# Patient Record
Sex: Female | Born: 1940
Health system: Southern US, Community
[De-identification: ages and names within clinical notes are randomized; demographics above are authoritative.]

## PROBLEM LIST (undated history)

## (undated) DIAGNOSIS — I10 Essential (primary) hypertension: Secondary | ICD-10-CM

## (undated) DIAGNOSIS — L309 Dermatitis, unspecified: Secondary | ICD-10-CM

## (undated) DIAGNOSIS — I739 Peripheral vascular disease, unspecified: Secondary | ICD-10-CM

## (undated) DIAGNOSIS — N6019 Diffuse cystic mastopathy of unspecified breast: Secondary | ICD-10-CM

## (undated) DIAGNOSIS — M1990 Unspecified osteoarthritis, unspecified site: Secondary | ICD-10-CM

## (undated) DIAGNOSIS — E119 Type 2 diabetes mellitus without complications: Secondary | ICD-10-CM

## (undated) DIAGNOSIS — H9190 Unspecified hearing loss, unspecified ear: Secondary | ICD-10-CM

## (undated) DIAGNOSIS — N189 Chronic kidney disease, unspecified: Secondary | ICD-10-CM

## (undated) DIAGNOSIS — F419 Anxiety disorder, unspecified: Secondary | ICD-10-CM

## (undated) DIAGNOSIS — R413 Other amnesia: Secondary | ICD-10-CM

## (undated) DIAGNOSIS — K219 Gastro-esophageal reflux disease without esophagitis: Secondary | ICD-10-CM

## (undated) DIAGNOSIS — M2042 Other hammer toe(s) (acquired), left foot: Secondary | ICD-10-CM

## (undated) DIAGNOSIS — K589 Irritable bowel syndrome without diarrhea: Secondary | ICD-10-CM

## (undated) DIAGNOSIS — Z9109 Other allergy status, other than to drugs and biological substances: Secondary | ICD-10-CM

## (undated) DIAGNOSIS — R32 Unspecified urinary incontinence: Secondary | ICD-10-CM

## (undated) DIAGNOSIS — M2041 Other hammer toe(s) (acquired), right foot: Secondary | ICD-10-CM

## (undated) DIAGNOSIS — E7800 Pure hypercholesterolemia, unspecified: Secondary | ICD-10-CM

## (undated) DIAGNOSIS — M069 Rheumatoid arthritis, unspecified: Secondary | ICD-10-CM

## (undated) DIAGNOSIS — D649 Anemia, unspecified: Secondary | ICD-10-CM

## (undated) HISTORY — DX: Peripheral vascular disease, unspecified: I73.9

## (undated) HISTORY — DX: Other amnesia: R41.3

## (undated) HISTORY — PX: ABDOMINAL HYSTERECTOMY: SHX81

## (undated) HISTORY — DX: Unspecified urinary incontinence: R32

## (undated) HISTORY — DX: Gastro-esophageal reflux disease without esophagitis: K21.9

## (undated) HISTORY — DX: Essential (primary) hypertension: I10

## (undated) HISTORY — DX: Diffuse cystic mastopathy of unspecified breast: N60.19

## (undated) HISTORY — DX: Other allergy status, other than to drugs and biological substances: Z91.09

## (undated) HISTORY — DX: Unspecified osteoarthritis, unspecified site: M19.90

## (undated) HISTORY — PX: SHOULDER SURGERY: SHX246

## (undated) HISTORY — DX: Dermatitis, unspecified: L30.9

## (undated) HISTORY — DX: Anxiety disorder, unspecified: F41.9

## (undated) HISTORY — DX: Unspecified hearing loss, unspecified ear: H91.90

## (undated) HISTORY — DX: Other hammer toe(s) (acquired), right foot: M20.41

## (undated) HISTORY — DX: Pure hypercholesterolemia, unspecified: E78.00

## (undated) HISTORY — DX: Rheumatoid arthritis, unspecified: M06.9

## (undated) HISTORY — DX: Type 2 diabetes mellitus without complications: E11.9

## (undated) HISTORY — DX: Other hammer toe(s) (acquired), left foot: M20.42

## (undated) HISTORY — DX: Irritable bowel syndrome, unspecified: K58.9

---

## 1998-04-11 ENCOUNTER — Encounter: Admission: RE | Admit: 1998-04-11 | Discharge: 1998-07-10 | Payer: Self-pay | Admitting: *Deleted

## 1998-09-20 ENCOUNTER — Encounter: Admission: RE | Admit: 1998-09-20 | Discharge: 1998-12-19 | Payer: Self-pay | Admitting: *Deleted

## 2001-07-02 ENCOUNTER — Encounter: Admission: RE | Admit: 2001-07-02 | Discharge: 2001-09-30 | Payer: Self-pay | Admitting: *Deleted

## 2002-02-26 ENCOUNTER — Encounter: Payer: Self-pay | Admitting: Family Medicine

## 2002-02-26 ENCOUNTER — Ambulatory Visit (HOSPITAL_COMMUNITY): Admission: RE | Admit: 2002-02-26 | Discharge: 2002-02-26 | Payer: Self-pay | Admitting: Family Medicine

## 2002-08-18 ENCOUNTER — Encounter (INDEPENDENT_AMBULATORY_CARE_PROVIDER_SITE_OTHER): Payer: Self-pay | Admitting: Specialist

## 2002-08-18 ENCOUNTER — Encounter: Payer: Self-pay | Admitting: Gastroenterology

## 2002-08-18 ENCOUNTER — Ambulatory Visit (HOSPITAL_COMMUNITY): Admission: RE | Admit: 2002-08-18 | Discharge: 2002-08-18 | Payer: Self-pay | Admitting: Gastroenterology

## 2002-08-18 DIAGNOSIS — K5730 Diverticulosis of large intestine without perforation or abscess without bleeding: Secondary | ICD-10-CM | POA: Insufficient documentation

## 2004-06-27 ENCOUNTER — Encounter: Admission: RE | Admit: 2004-06-27 | Discharge: 2004-06-27 | Payer: Self-pay | Admitting: Rheumatology

## 2004-11-08 ENCOUNTER — Ambulatory Visit (HOSPITAL_COMMUNITY): Admission: RE | Admit: 2004-11-08 | Discharge: 2004-11-08 | Payer: Self-pay | Admitting: Family Medicine

## 2004-11-15 ENCOUNTER — Ambulatory Visit (HOSPITAL_COMMUNITY): Admission: RE | Admit: 2004-11-15 | Discharge: 2004-11-15 | Payer: Self-pay | Admitting: Family Medicine

## 2005-01-19 ENCOUNTER — Encounter: Admission: RE | Admit: 2005-01-19 | Discharge: 2005-04-19 | Payer: Self-pay | Admitting: Family Medicine

## 2005-03-29 ENCOUNTER — Ambulatory Visit (HOSPITAL_COMMUNITY): Admission: RE | Admit: 2005-03-29 | Discharge: 2005-03-29 | Payer: Self-pay | Admitting: Family Medicine

## 2005-10-15 ENCOUNTER — Other Ambulatory Visit: Admission: RE | Admit: 2005-10-15 | Discharge: 2005-10-15 | Payer: Self-pay | Admitting: Family Medicine

## 2005-12-26 ENCOUNTER — Emergency Department (HOSPITAL_COMMUNITY): Admission: EM | Admit: 2005-12-26 | Discharge: 2005-12-27 | Payer: Self-pay | Admitting: Emergency Medicine

## 2008-08-17 ENCOUNTER — Encounter: Payer: Self-pay | Admitting: Gastroenterology

## 2008-08-17 DIAGNOSIS — E119 Type 2 diabetes mellitus without complications: Secondary | ICD-10-CM | POA: Insufficient documentation

## 2008-08-17 DIAGNOSIS — I10 Essential (primary) hypertension: Secondary | ICD-10-CM | POA: Insufficient documentation

## 2008-08-17 DIAGNOSIS — E1129 Type 2 diabetes mellitus with other diabetic kidney complication: Secondary | ICD-10-CM | POA: Insufficient documentation

## 2008-08-17 DIAGNOSIS — D126 Benign neoplasm of colon, unspecified: Secondary | ICD-10-CM | POA: Insufficient documentation

## 2008-08-18 ENCOUNTER — Ambulatory Visit: Payer: Self-pay | Admitting: Gastroenterology

## 2008-08-18 DIAGNOSIS — R1310 Dysphagia, unspecified: Secondary | ICD-10-CM | POA: Insufficient documentation

## 2008-08-30 ENCOUNTER — Telehealth (INDEPENDENT_AMBULATORY_CARE_PROVIDER_SITE_OTHER): Payer: Self-pay | Admitting: *Deleted

## 2008-09-14 ENCOUNTER — Telehealth: Payer: Self-pay | Admitting: Gastroenterology

## 2008-09-14 ENCOUNTER — Ambulatory Visit: Payer: Self-pay | Admitting: Gastroenterology

## 2010-04-04 ENCOUNTER — Other Ambulatory Visit: Admission: RE | Admit: 2010-04-04 | Discharge: 2010-04-04 | Payer: Self-pay | Admitting: Family Medicine

## 2010-05-12 ENCOUNTER — Telehealth: Payer: Self-pay | Admitting: Gastroenterology

## 2010-08-08 ENCOUNTER — Encounter: Admission: RE | Admit: 2010-08-08 | Discharge: 2010-08-08 | Payer: Self-pay | Admitting: Family Medicine

## 2010-12-16 ENCOUNTER — Encounter: Payer: Self-pay | Admitting: Family Medicine

## 2010-12-28 NOTE — Progress Notes (Signed)
Summary: doctor switch request  Phone Note From Other Clinic Call back at [phone]   Caller: [facility] Call For: Dr. Deatra Ina Reason for Call: Schedule Patient Appt Summary of Call: would like to switch from Dr. Deatra Ina to Dr. Olevia Perches... reason: would like someone who "listens and cares"... referred for "guaiac positive stools"... call [name] directly to sch first appt or inform of nonapproval  Initial call taken by: Lucien Mons,  May 12, 2010 11:47 AM  Follow-up for Phone Call        [name] called back and asked that previous mesg be cancelled... [facility] is going to send pt "somewhere else"... asked then if pt will be discontinuing her care here with Dr. Deatra Ina and [name] stated that she "will let the pt tell us that" Follow-up by: Lucien Mons,  May 12, 2010 11:56 AM

## 2011-08-27 LAB — GLUCOSE, CAPILLARY
Glucose-Capillary: 122 — ABNORMAL HIGH
Glucose-Capillary: 91

## 2012-10-10 ENCOUNTER — Other Ambulatory Visit: Payer: Self-pay | Admitting: Family Medicine

## 2012-10-10 ENCOUNTER — Other Ambulatory Visit: Payer: Self-pay | Admitting: *Deleted

## 2012-10-10 DIAGNOSIS — R0989 Other specified symptoms and signs involving the circulatory and respiratory systems: Secondary | ICD-10-CM

## 2012-10-15 ENCOUNTER — Ambulatory Visit
Admission: RE | Admit: 2012-10-15 | Discharge: 2012-10-15 | Disposition: A | Discharge status: Home / Self Care | Payer: Medicare Other | Source: Ambulatory Visit | Attending: Family Medicine | Admitting: Family Medicine

## 2012-10-15 DIAGNOSIS — R0989 Other specified symptoms and signs involving the circulatory and respiratory systems: Secondary | ICD-10-CM

## 2012-10-20 ENCOUNTER — Other Ambulatory Visit (HOSPITAL_COMMUNITY): Payer: Self-pay | Admitting: Family Medicine

## 2012-10-20 DIAGNOSIS — I999 Unspecified disorder of circulatory system: Secondary | ICD-10-CM

## 2012-10-22 ENCOUNTER — Ambulatory Visit (HOSPITAL_COMMUNITY)
Admission: RE | Admit: 2012-10-22 | Discharge: 2012-10-22 | Disposition: A | Discharge status: Home / Self Care | Payer: Medicare Other | Source: Ambulatory Visit | Attending: Family Medicine | Admitting: Family Medicine

## 2012-10-22 ENCOUNTER — Ambulatory Visit (HOSPITAL_COMMUNITY): Admission: RE | Admit: 2012-10-22 | Payer: Medicare Other | Source: Ambulatory Visit

## 2012-10-22 ENCOUNTER — Other Ambulatory Visit (HOSPITAL_COMMUNITY): Payer: Self-pay | Admitting: Family Medicine

## 2012-10-22 ENCOUNTER — Ambulatory Visit (HOSPITAL_COMMUNITY): Payer: Medicare Other

## 2012-10-22 DIAGNOSIS — I70209 Unspecified atherosclerosis of native arteries of extremities, unspecified extremity: Secondary | ICD-10-CM | POA: Insufficient documentation

## 2012-10-22 DIAGNOSIS — I999 Unspecified disorder of circulatory system: Secondary | ICD-10-CM

## 2012-10-22 DIAGNOSIS — M79609 Pain in unspecified limb: Secondary | ICD-10-CM | POA: Insufficient documentation

## 2012-10-22 LAB — BUN: BUN: 16 mg/dL (ref 6–23)

## 2012-10-22 LAB — CREATININE, SERUM
Creatinine, Ser: 0.75 mg/dL (ref 0.50–1.10)
GFR calc Af Amer: >90 mL/min (ref >90)
GFR calc non Af Amer: 83 mL/min — ABNORMAL LOW (ref >90)

## 2012-10-22 LAB — GLUCOSE, CAPILLARY: Glucose-Capillary: 108 mg/dL — ABNORMAL HIGH (ref 70–99)

## 2012-10-22 MED ORDER — GADOBENATE DIMEGLUMINE 529 MG/ML IV SOLN
15.0000 mL | Freq: Once | INTRAVENOUS | Status: AC
  Administered 2012-10-22: 15 mL via INTRAVENOUS

## 2012-12-01 ENCOUNTER — Encounter: Payer: Self-pay | Admitting: Vascular Surgery

## 2012-12-02 ENCOUNTER — Encounter: Payer: Self-pay | Admitting: Vascular Surgery

## 2012-12-02 ENCOUNTER — Ambulatory Visit (INDEPENDENT_AMBULATORY_CARE_PROVIDER_SITE_OTHER): Payer: Medicare Other | Admitting: Vascular Surgery

## 2012-12-02 VITALS — BP 153/80 | HR 83 | Resp 18 | Ht 69.5 in | Wt 138.0 lb

## 2012-12-02 DIAGNOSIS — I739 Peripheral vascular disease, unspecified: Secondary | ICD-10-CM

## 2012-12-02 NOTE — Progress Notes (Signed)
[facility]   Patient name: Wendy [name] MRN: [id number] DOB: [birth year] Sex: female   Referred by: Dr Dema Severin  Reason for referral:  Chief Complaint  Patient presents with  . New Evaluation    B/L LEG PAIN    HISTORY OF PRESENT ILLNESS: Patient is a very pleasant 72 year old female who presents today for evaluation of lower extremity symptoms and for discussion of recent vascular studies. She reports a history of swelling in her left foot and ankle more so than her right. She does not have any history of tissue loss in either lower Shoney's. She does report that with a great deal of walking in both legs "give out. She reports this is whole leg and not specifically in her foot Her ankle. Does have a history of arthritis and diabetes. She is not a cigarette smoker.  Past Medical History  Diagnosis Date  . Arthritis   . Diabetes mellitus without complication   . Hypertension     Past Surgical History  Procedure Date  . Abdominal hysterectomy   . Shoulder surgery     FATTY TUMOR REMOVED    History   Social History  . Marital Status: Single    Spouse Name: N/A    Number of Children: N/A  . Years of Education: N/A   Occupational History  . Not on file.   Social History Main Topics  . Smoking status: Never Smoker   . Smokeless tobacco: Never Used  . Alcohol Use: No  . Drug Use: No  . Sexually Active: Not on file   Other Topics Concern  . Not on file   Social History Narrative  . No narrative on file    Family History  Problem Relation Age of Onset  . Diabetes Mother   . Hypertension Mother     Allergies as of 12/02/2012 - Review Complete 12/02/2012  Allergen Reaction Noted  . Amlodipine besylate    . Codeine    . Erythromycin    . Lisinopril    . Naproxen    . Penicillins    . Pioglitazone      Current Outpatient Prescriptions on File Prior to Visit  Medication Sig Dispense Refill  . estradiol (ESTRACE) 0.5 MG tablet  Take 0.5 mg by mouth daily.      . irbesartan (AVAPRO) 300 MG tablet Take 300 mg by mouth at bedtime.      . metFORMIN (GLUCOPHAGE) 1000 MG tablet Take 1,000 mg by mouth 2 (two) times daily with a meal.         REVIEW OF SYSTEMS:  Positives indicated with an "X"  CARDIOVASCULAR:  [ ]  chest pain   [ ]  chest pressure   [ ]  palpitations   [ ]  orthopnea   [ ]  dyspnea on exertion   [ ]  claudication   [ ]  rest pain   [ ]  DVT   [ ]  phlebitis PULMONARY:   [ ]  productive cough   [ ]  asthma   [ ]  wheezing NEUROLOGIC:   [x ] weakness  [ ]  paresthesias  [ ]  aphasia  [ ]  amaurosis  [ ]  dizziness HEMATOLOGIC:   [ ]  bleeding problems   [ ]  clotting disorders MUSCULOSKELETAL:  [ ]  joint pain   [ ]  joint swelling GASTROINTESTINAL: [ ]   blood in stool  [ ]   hematemesis GENITOURINARY:  [ ]   dysuria  [ ]   hematuria PSYCHIATRIC:  [ ]  history of major depression  INTEGUMENTARY:  [ ]  rashes  [ ]  ulcers CONSTITUTIONAL:  [ ]  fever   [ ]  chills  PHYSICAL EXAMINATION:  General: The patient is a well-nourished female, in no acute distress. Vital signs are BP 153/80  Pulse 83  Resp 18  Ht 5' 9.5" (1.765 m)  Wt 138 lb (62.596 kg)  BMI 20.09 kg/m2 Pulmonary: There is a good air exchange bilaterally without wheezing or rales. Abdomen: Soft and non-tender with normal pitch bowel sounds. Musculoskeletal: There are no major deformities.  There is no significant extremity pain. Neurologic: No focal weakness or paresthesias are detected, Skin: There are no ulcer or rashes noted. Psychiatric: The patient has normal affect. Cardiovascular: There is a regular rate and rhythm without significant murmur appreciated. Pulse status: 2+ radial 2+ femoral and 2+ popliteal pulses. She has a faint dorsalis pedis pulse on the left and no palpable pedal pulses on the right. Carotid arteries without bruits bilaterally   Vascular Lab Studies:   imaging she underwent lower extremity noninvasive studies showing tibial  occlusive disease  MRA similarly showed no significant aortoiliac occlusive disease and mild SFA occlusive disease. She does have poor tibial runoff with anterior tibial being the dominant runoff vessel bilaterally  Impression and Plan:  Distal tibial occlusive disease. I discussed the significance of this at length with the patient. I explained that this generally does not require any treatment in general he has not caused symptoms. I do not feel that her leg tiredness is related to this since it is whole leg bilaterally. I explained only concern would be if she had difficulty with wound problems in her feet that we would need to be involved to assure healing. At her current level of flow, I suspect that she would have no difficulty healing in the lesions in the same became progressive. She was reassured this discussion will see [place] again on an as-needed basis    EARLY, TODD [facility] [place] Office: [phone]

## 2014-02-19 ENCOUNTER — Other Ambulatory Visit: Payer: Self-pay | Admitting: Family Medicine

## 2014-02-19 DIAGNOSIS — R634 Abnormal weight loss: Secondary | ICD-10-CM

## 2014-02-19 DIAGNOSIS — R109 Unspecified abdominal pain: Secondary | ICD-10-CM

## 2014-02-25 DIAGNOSIS — D509 Iron deficiency anemia, unspecified: Secondary | ICD-10-CM | POA: Insufficient documentation

## 2014-02-25 DIAGNOSIS — M069 Rheumatoid arthritis, unspecified: Secondary | ICD-10-CM | POA: Insufficient documentation

## 2014-02-27 DIAGNOSIS — E041 Nontoxic single thyroid nodule: Secondary | ICD-10-CM | POA: Insufficient documentation

## 2014-02-27 DIAGNOSIS — E278 Other specified disorders of adrenal gland: Secondary | ICD-10-CM | POA: Insufficient documentation

## 2014-03-02 ENCOUNTER — Ambulatory Visit: Payer: Medicare Other | Admitting: Neurology

## 2014-04-09 ENCOUNTER — Encounter: Payer: Self-pay | Admitting: Neurology

## 2014-04-09 ENCOUNTER — Ambulatory Visit (INDEPENDENT_AMBULATORY_CARE_PROVIDER_SITE_OTHER): Payer: Medicare Other | Admitting: Neurology

## 2014-04-09 DIAGNOSIS — R413 Other amnesia: Secondary | ICD-10-CM
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
## 2014-04-09 NOTE — Patient Instructions (Signed)
1. Continue control of blood pressure, diabetes, and cholesterol 2. Please let us know where to obtain prior records of your MRI and brain studies 3. Physical and brain stimulation exercises are important for brain health

## 2014-04-20 ENCOUNTER — Telehealth: Payer: Self-pay | Admitting: Neurology

## 2014-04-20 NOTE — Telephone Encounter (Signed)
Pt son would like to talk to someone about his mother. He would like to see if we got the outside records please call Ellard Artis at [phone]

## 2014-04-21 NOTE — Telephone Encounter (Signed)
Spoke with patient son

## 2014-04-23 ENCOUNTER — Emergency Department (HOSPITAL_COMMUNITY): Payer: Medicare Other

## 2014-04-23 ENCOUNTER — Emergency Department (HOSPITAL_COMMUNITY)
Admission: EM | Admit: 2014-04-23 | Discharge: 2014-04-23 | Disposition: A | Discharge status: Home / Self Care | Payer: Medicare Other | Attending: Emergency Medicine | Admitting: Emergency Medicine

## 2014-04-23 ENCOUNTER — Encounter (HOSPITAL_COMMUNITY): Payer: Self-pay | Admitting: Emergency Medicine

## 2014-04-23 DIAGNOSIS — Z7982 Long term (current) use of aspirin: Secondary | ICD-10-CM | POA: Insufficient documentation

## 2014-04-23 DIAGNOSIS — E119 Type 2 diabetes mellitus without complications: Secondary | ICD-10-CM | POA: Insufficient documentation

## 2014-04-23 DIAGNOSIS — M549 Dorsalgia, unspecified: Secondary | ICD-10-CM

## 2014-04-23 DIAGNOSIS — F0390 Unspecified dementia without behavioral disturbance: Secondary | ICD-10-CM | POA: Insufficient documentation

## 2014-04-23 DIAGNOSIS — I10 Essential (primary) hypertension: Secondary | ICD-10-CM | POA: Insufficient documentation

## 2014-04-23 DIAGNOSIS — Z880 Allergy status to penicillin: Secondary | ICD-10-CM | POA: Insufficient documentation

## 2014-04-23 DIAGNOSIS — M129 Arthropathy, unspecified: Secondary | ICD-10-CM | POA: Insufficient documentation

## 2014-04-23 DIAGNOSIS — Z79899 Other long term (current) drug therapy: Secondary | ICD-10-CM | POA: Insufficient documentation

## 2014-04-23 LAB — URINALYSIS, ROUTINE W REFLEX MICROSCOPIC
Bilirubin Urine: NEGATIVE
Glucose, UA: NEGATIVE mg/dL
Ketones, ur: NEGATIVE mg/dL
Leukocytes, UA: NEGATIVE
Nitrite: NEGATIVE
Protein, ur: 100 mg/dL — AB
Specific Gravity, Urine: >1.03 — ABNORMAL HIGH (ref 1.005–1.030)
Urobilinogen, UA: 0.2 mg/dL (ref 0.0–1.0)
pH: 5.5 (ref 5.0–8.0)

## 2014-04-23 LAB — CBC WITH DIFFERENTIAL/PLATELET
Basophils Absolute: 0 K/uL (ref 0.0–0.1)
Basophils Relative: 0 % (ref 0–1)
Eosinophils Absolute: 0 K/uL (ref 0.0–0.7)
Eosinophils Relative: 0 % (ref 0–5)
HCT: 38.7 % (ref 36.0–46.0)
Hemoglobin: 11.5 g/dL — ABNORMAL LOW (ref 12.0–15.0)
Lymphocytes Relative: 19 % (ref 12–46)
Lymphs Abs: 2.2 K/uL (ref 0.7–4.0)
MCH: 23 pg — ABNORMAL LOW (ref 26.0–34.0)
MCHC: 29.7 g/dL — ABNORMAL LOW (ref 30.0–36.0)
MCV: 77.6 fL — ABNORMAL LOW (ref 78.0–100.0)
Monocytes Absolute: 1.1 K/uL — ABNORMAL HIGH (ref 0.1–1.0)
Monocytes Relative: 9 % (ref 3–12)
Neutro Abs: 8.2 K/uL — ABNORMAL HIGH (ref 1.7–7.7)
Neutrophils Relative %: 72 % (ref 43–77)
Platelets: 368 K/uL (ref 150–400)
RBC: 4.99 MIL/uL (ref 3.87–5.11)
RDW: 15.5 % (ref 11.5–15.5)
WBC: 11.5 K/uL — ABNORMAL HIGH (ref 4.0–10.5)

## 2014-04-23 LAB — COMPREHENSIVE METABOLIC PANEL
ALT: 27 U/L (ref 0–35)
AST: 48 U/L — ABNORMAL HIGH (ref 0–37)
Albumin: 3.8 g/dL (ref 3.5–5.2)
Alkaline Phosphatase: 72 U/L (ref 39–117)
BUN: 17 mg/dL (ref 6–23)
CO2: 26 mEq/L (ref 19–32)
Calcium: 10.4 mg/dL (ref 8.4–10.5)
Chloride: 101 mEq/L (ref 96–112)
Creatinine, Ser: 0.75 mg/dL (ref 0.50–1.10)
GFR calc Af Amer: >90 mL/min (ref >90)
GFR calc non Af Amer: 81 mL/min — ABNORMAL LOW (ref >90)
Glucose, Bld: 139 mg/dL — ABNORMAL HIGH (ref 70–99)
Potassium: 3.9 mEq/L (ref 3.7–5.3)
Sodium: 143 mEq/L (ref 137–147)
Total Bilirubin: 1.1 mg/dL (ref 0.3–1.2)
Total Protein: 8.5 g/dL — ABNORMAL HIGH (ref 6.0–8.3)

## 2014-04-23 LAB — URINE MICROSCOPIC-ADD ON

## 2014-04-23 MED ORDER — SODIUM CHLORIDE 0.9 % IV BOLUS (SEPSIS)
500.0000 mL | Freq: Once | INTRAVENOUS | Status: AC
  Administered 2014-04-23: 500 mL via INTRAVENOUS

## 2014-04-23 NOTE — ED Notes (Signed)
CBG done = 126

## 2014-04-23 NOTE — ED Notes (Signed)
Dr. Roderic Palau cleared to remove backboard.  Cleaned patient of urine.  Placed in gown.
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
## 2014-04-23 NOTE — Discharge Instructions (Signed)
Follow up with your md next week for recheck °

## 2014-04-23 NOTE — ED Notes (Signed)
Patient with no complaints at this time. Respirations even and unlabored. Skin warm/dry. Discharge instructions reviewed with patient at this time. Patient given opportunity to voice concerns/ask questions. IV removed per policy and band-aid applied to site. Patient discharged at this time and left Emergency Department with steady gait.  

## 2014-05-03 ENCOUNTER — Telehealth: Payer: Self-pay | Admitting: Neurology

## 2014-05-03 DIAGNOSIS — F0390 Unspecified dementia without behavioral disturbance: Secondary | ICD-10-CM

## 2014-05-03 NOTE — Telephone Encounter (Signed)
Son calling to check on status of records from Dr. McDermont's office. He says they have been sent x2. Please check on this and call back - son's number is [phone] / Sherri S.

## 2014-05-04 MED ORDER — DONEPEZIL HCL 10 MG PO TABS: ORAL_TABLET | ORAL | Status: DC
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
## 2014-05-12 ENCOUNTER — Encounter: Payer: Self-pay | Admitting: *Deleted

## 2014-05-12 NOTE — Telephone Encounter (Signed)
Tried to reach patient several time and has been unable to reach. Will send a letter.

## 2014-05-18 ENCOUNTER — Telehealth: Payer: Self-pay | Admitting: Neurology

## 2014-05-18 NOTE — Telephone Encounter (Signed)
Pt daughter mrs Percell Miller needs to talk to someone about her mother medication please call [phone]

## 2014-05-19 ENCOUNTER — Telehealth: Payer: Self-pay | Admitting: *Deleted

## 2014-05-19 NOTE — Telephone Encounter (Signed)
Patient's daughter notified.

## 2014-05-19 NOTE — Telephone Encounter (Signed)
Error

## 2014-05-19 NOTE — Telephone Encounter (Signed)
Patients daughter called stating every since she started taking the half tablet of Aricept she has been very confused,slow with movement, unsteady gait. The daughter states she is afraid to move on to the whole tablet. Please advise.

## 2014-05-19 NOTE — Telephone Encounter (Signed)
Pls tell her to stop medication and continue to monitor off Aricept. Thanks

## 2014-06-26 ENCOUNTER — Encounter: Payer: Self-pay | Admitting: *Deleted

## 2015-01-13 DIAGNOSIS — N183 Chronic kidney disease, stage 3 unspecified: Secondary | ICD-10-CM | POA: Insufficient documentation

## 2015-01-13 DIAGNOSIS — E785 Hyperlipidemia, unspecified: Secondary | ICD-10-CM | POA: Insufficient documentation

## 2015-01-13 DIAGNOSIS — E1122 Type 2 diabetes mellitus with diabetic chronic kidney disease: Secondary | ICD-10-CM | POA: Insufficient documentation

## 2016-03-23 ENCOUNTER — Encounter: Payer: Self-pay | Admitting: Gastroenterology

## 2017-12-05 DIAGNOSIS — Z79631 Long term (current) use of antimetabolite agent: Secondary | ICD-10-CM | POA: Insufficient documentation

## 2017-12-05 DIAGNOSIS — Z79899 Other long term (current) drug therapy: Secondary | ICD-10-CM | POA: Insufficient documentation

## 2019-09-17 ENCOUNTER — Encounter: Payer: Self-pay | Admitting: Family Medicine

## 2019-10-28 ENCOUNTER — Encounter: Payer: Self-pay | Admitting: Family Medicine

## 2020-02-11 ENCOUNTER — Other Ambulatory Visit: Payer: Self-pay

## 2020-02-11 ENCOUNTER — Ambulatory Visit: Payer: Medicare PPO | Admitting: Family Medicine

## 2020-02-11 ENCOUNTER — Encounter: Payer: Self-pay | Admitting: Family Medicine

## 2020-02-11 VITALS — BP 154/88 | HR 92 | Temp 96.4°F | Ht 69.5 in | Wt 118.4 lb

## 2020-02-11 DIAGNOSIS — E1122 Type 2 diabetes mellitus with diabetic chronic kidney disease: Secondary | ICD-10-CM

## 2020-02-11 DIAGNOSIS — M069 Rheumatoid arthritis, unspecified: Secondary | ICD-10-CM

## 2020-02-11 DIAGNOSIS — E1165 Type 2 diabetes mellitus with hyperglycemia: Secondary | ICD-10-CM | POA: Diagnosis not present

## 2020-02-11 DIAGNOSIS — L89302 Pressure ulcer of unspecified buttock, stage 2: Secondary | ICD-10-CM

## 2020-02-11 DIAGNOSIS — E785 Hyperlipidemia, unspecified: Secondary | ICD-10-CM

## 2020-02-11 DIAGNOSIS — N3941 Urge incontinence: Secondary | ICD-10-CM | POA: Diagnosis not present

## 2020-02-11 DIAGNOSIS — Z972 Presence of dental prosthetic device (complete) (partial): Secondary | ICD-10-CM

## 2020-02-11 DIAGNOSIS — N183 Chronic kidney disease, stage 3 unspecified: Secondary | ICD-10-CM

## 2020-02-11 DIAGNOSIS — D509 Iron deficiency anemia, unspecified: Secondary | ICD-10-CM

## 2020-02-11 DIAGNOSIS — Z79899 Other long term (current) drug therapy: Secondary | ICD-10-CM

## 2020-02-11 LAB — BAYER DCA HB A1C WAIVED: HB A1C (BAYER DCA - WAIVED): 7.4 % — ABNORMAL HIGH (ref <7.0)

## 2020-02-11 MED ORDER — TEGADERM HYDROCOLLOID THIN EX MISC: 1.0000 | CUTANEOUS | 0 refills | Status: DC

## 2020-02-12 ENCOUNTER — Telehealth: Payer: Self-pay | Admitting: Family Medicine

## 2020-02-12 LAB — CBC WITH DIFFERENTIAL/PLATELET
Basophils Absolute: 0.1 10*3/uL (ref 0.0–0.2)
Basos: 1 %
EOS (ABSOLUTE): 0 10*3/uL (ref 0.0–0.4)
Eos: 1 %
Hematocrit: 29.6 % — ABNORMAL LOW (ref 34.0–46.6)
Hemoglobin: 9 g/dL — ABNORMAL LOW (ref 11.1–15.9)
Immature Grans (Abs): 0 10*3/uL (ref 0.0–0.1)
Immature Granulocytes: 1 %
Lymphocytes Absolute: 1.5 10*3/uL (ref 0.7–3.1)
Lymphs: 20 %
MCH: 22.3 pg — ABNORMAL LOW (ref 26.6–33.0)
MCHC: 30.4 g/dL — ABNORMAL LOW (ref 31.5–35.7)
MCV: 73 fL — ABNORMAL LOW (ref 79–97)
Monocytes Absolute: 0.7 10*3/uL (ref 0.1–0.9)
Monocytes: 9 %
Neutrophils Absolute: 5.4 10*3/uL (ref 1.4–7.0)
Neutrophils: 68 %
Platelets: 634 10*3/uL — ABNORMAL HIGH (ref 150–450)
RBC: 4.04 x10E6/uL (ref 3.77–5.28)
RDW: 18.1 % — ABNORMAL HIGH (ref 11.7–15.4)
WBC: 7.7 10*3/uL (ref 3.4–10.8)

## 2020-02-12 LAB — CMP14+EGFR
ALT: 24 IU/L (ref 0–32)
AST: 30 IU/L (ref 0–40)
Albumin/Globulin Ratio: 1.3 (ref 1.2–2.2)
Albumin: 3.9 g/dL (ref 3.7–4.7)
Alkaline Phosphatase: 32 IU/L — ABNORMAL LOW (ref 39–117)
BUN/Creatinine Ratio: 14 (ref 12–28)
BUN: 20 mg/dL (ref 8–27)
Bilirubin Total: 0.5 mg/dL (ref 0.0–1.2)
CO2: 24 mmol/L (ref 20–29)
Calcium: 10.7 mg/dL — ABNORMAL HIGH (ref 8.7–10.3)
Chloride: 105 mmol/L (ref 96–106)
Creatinine, Ser: 1.39 mg/dL — ABNORMAL HIGH (ref 0.57–1.00)
GFR calc Af Amer: 41 mL/min/{1.73_m2} — ABNORMAL LOW (ref >59)
GFR calc non Af Amer: 36 mL/min/{1.73_m2} — ABNORMAL LOW (ref >59)
Globulin, Total: 3 g/dL (ref 1.5–4.5)
Glucose: 104 mg/dL — ABNORMAL HIGH (ref 65–99)
Potassium: 5.3 mmol/L — ABNORMAL HIGH (ref 3.5–5.2)
Sodium: 141 mmol/L (ref 134–144)
Total Protein: 6.9 g/dL (ref 6.0–8.5)

## 2020-02-12 LAB — LIPID PANEL
Chol/HDL Ratio: 2.7 ratio (ref 0.0–4.4)
Cholesterol, Total: 173 mg/dL (ref 100–199)
HDL: 63 mg/dL (ref >39)
LDL Chol Calc (NIH): 96 mg/dL (ref 0–99)
Triglycerides: 72 mg/dL (ref 0–149)
VLDL Cholesterol Cal: 14 mg/dL (ref 5–40)

## 2020-02-12 MED ORDER — METFORMIN HCL 1000 MG PO TABS: 1000.0000 mg | ORAL_TABLET | Freq: Two times a day (BID) | ORAL | 1 refills | Status: DC

## 2020-02-12 NOTE — Telephone Encounter (Signed)
  Medication Request  02/12/2020  What is the name of the medication? Metformin 1000 mg Patient is out  Have you contacted your pharmacy to request a refill? NO  Which pharmacy would you like this sent to? [facility] in [place]   Patient notified that their request is being sent to the clinical staff for review and that they should receive a call once it is complete. If they do not receive a call within 24 hours they can check with their pharmacy or our office.

## 2020-02-12 NOTE — Telephone Encounter (Signed)
Refilled

## 2020-02-12 NOTE — Telephone Encounter (Signed)
A1C completed yesterday.  Patient needs refills.

## 2020-02-14 ENCOUNTER — Other Ambulatory Visit: Payer: Self-pay | Admitting: Family Medicine

## 2020-02-14 ENCOUNTER — Encounter: Payer: Self-pay | Admitting: Family Medicine

## 2020-02-14 DIAGNOSIS — E1122 Type 2 diabetes mellitus with diabetic chronic kidney disease: Secondary | ICD-10-CM

## 2020-02-14 DIAGNOSIS — N183 Chronic kidney disease, stage 3 unspecified: Secondary | ICD-10-CM

## 2020-02-14 NOTE — Progress Notes (Signed)
New Patient Office Visit  Assessment & Plan:  1. Type 2 diabetes mellitus with hyperglycemia, without long-term current use of insulin (HCC) Lab Results  Component Value Date   HGBA1C 7.4 (H) 02/11/2020  - Diabetes is at goal of A1c in the 7s. - Medications: continue current medications - Home glucose monitoring: as needed - Patient is not currently taking a statin. Patient is taking an ACE-inhibitor/ARB.  - Urine Microalbumin/Creat Ratio: today - Instruction/counseling given: reminded to get eye exam - CMP14+EGFR - Lipid panel - Bayer DCA Hb A1c Waived - Microalbumin / creatinine urine ratio - Ambulatory referral to Podiatry  2. Dyslipidemia - CMP14+EGFR - Lipid panel  3. Urge incontinence of urine - Discussed if UA is negative we could start her on Myrbetriq. Discussed not referring to urology for something that can be managed by PCP.  - Urinalysis, Complete  4. CKD stage 3 due to type 2 diabetes mellitus ([place]) - Discussed assessing kidney function prior to placing referral to nephrologist.  - CMP14+EGFR  5. Rheumatoid arthritis, involving unspecified site, unspecified whether rheumatoid factor present [facility]) - Discussed not referring for something that could be managed by PCP. Patient has been on methotrexate and is doing well. Advised to discuss with PCP when they have appointment with her.  - CMP14+EGFR  6. Wears dentures - Advised to call and schedule dental appointment, that she does not need a referral for this.   7. Microcytic anemia - CBC with Differential/Platelet  8. High risk medication use - CBC with Differential/Platelet  9. Pressure injury of buttock, stage 2, unspecified laterality ([place]) - To apply thin hydrocolloid dressing. Advised it may stay on for a week and this was fine but if it fell off before then, that they may go ahead and replace. Discussed positioning and rotating when lying down or sitting in a chair to redistribute pressure.  -  Hydroactive Dressings (TEGADERM HYDROCOLLOID THIN) MISC; Apply 1 each topically 2 (two) times a week.  Dispense: 10 each; Refill: 0   Follow-up: Return ASAP with Dr. Lajuana Ripple, for establish care.   Hendricks Limes, MSN, APRN, FNP-C [facility]  Subjective:  Patient ID: Wendy [name], female    DOB: 08-30-1941  Age: 79 y.o. MRN: [id number]  Patient Care Team: Loman Brooklyn, FNP as PCP - General (Family Medicine)  CC:  Chief Complaint  Patient presents with  . New Patient (Initial Visit)    [facility]- Dr. Elvera Lennox  . Establish Care    HPI Wendy [name] presents to establish care. She was previously seeing Dr. Elvera Lennox in [place] but has been in [place], [place] for the past 8-9 months with her daughter. Now that she has returned to the [place] area, they would like all her doctors to be as close as possible. She has been home for 2.5 weeks now.    Patient is accompanied by her son today, who she is okay with being present.   Patient's son is requesting multiple referrals today including a urologist, nephrologist, podiatrist, dentist, and rheumatologist. Son reports she was seeing a urologist (Dr. Trenton Founds) for urinary incontinence and because she has had UTIs in the past. The nephrologist (Dr. Narda Amber) was due to CKD. The podiatrist (Dr. Tsosie Billing) was for diabetic shoes. The dentist is for dentures. The rheumatologist (Dr. Barrie Folk) is for rheumatoid arthritis. All of these doctors were in [place], [place]. Previous PCP was Dr. Jordan Likes.    Review of Systems  Constitutional: Negative for  chills, fever, malaise/fatigue and weight loss.  HENT: Negative for congestion, ear discharge, ear pain, nosebleeds, sinus pain, sore throat and tinnitus.   Eyes: Negative for blurred vision, double vision, pain, discharge and redness.  Respiratory: Negative for cough, shortness of breath and wheezing.   Cardiovascular: Negative for chest pain, palpitations and leg  swelling.  Gastrointestinal: Negative for abdominal pain, constipation, diarrhea, heartburn, nausea and vomiting.  Genitourinary: Negative for dysuria, frequency and urgency.  Musculoskeletal: Negative for myalgias.  Skin: Negative for rash.  Neurological: Negative for dizziness, seizures, weakness and headaches.  Psychiatric/Behavioral: Positive for memory loss. Negative for depression, substance abuse and suicidal ideas. The patient is not nervous/anxious.     Current Outpatient Medications:  .  amLODipine (NORVASC) 5 MG tablet, Take 5 mg by mouth daily., Disp: , Rfl:  .  aspirin EC 81 MG tablet, Take 81 mg by mouth daily., Disp: , Rfl:  .  Cholecalciferol 25 MCG (1000 UT) tablet, Take 1 tablet by mouth daily., Disp: , Rfl:  .  cilostazol (PLETAL) 100 MG tablet, Take 100 mg by mouth daily., Disp: , Rfl:  .  cloNIDine (CATAPRES - DOSED IN MG/24 HR) 0.2 mg/24hr patch, Place 0.2 mg onto the skin once a week., Disp: , Rfl:  .  cycloSPORINE (RESTASIS) 0.05 % ophthalmic emulsion, 1 drop 2 (two) times daily., Disp: , Rfl:  .  diclofenac Sodium (VOLTAREN) 1 % GEL, Apply 2 g topically daily., Disp: , Rfl:  .  ferrous sulfate 325 (65 FE) MG tablet, Take 325 mg by mouth 2 (two) times daily., Disp: , Rfl:  .  folic acid (FOLVITE) 1 MG tablet, Take 1 mg by mouth daily., Disp: , Rfl:  .  irbesartan (AVAPRO) 300 MG tablet, Take 300 mg by mouth every morning. , Disp: , Rfl:  .  memantine (NAMENDA) 10 MG tablet, Take 1 tablet by mouth in the morning and at bedtime., Disp: , Rfl:  .  methotrexate (RHEUMATREX) 2.5 MG tablet, Take 1 tablet by mouth once a week., Disp: , Rfl:  .  Multiple Vitamin (MULTIVITAMIN WITH MINERALS) TABS tablet, Take 1 tablet by mouth daily., Disp: , Rfl:  .  nortriptyline (PAMELOR) 10 MG capsule, Take 10 mg by mouth at bedtime. 1-2 tablets nightly, Disp: , Rfl:  .  rivastigmine (EXELON) 9.5 mg/24hr, Place 1 patch onto the skin daily., Disp: , Rfl:  .  sitaGLIPtin (JANUVIA) 50 MG  tablet, Take 50 mg by mouth daily., Disp: , Rfl:  .  Hydroactive Dressings (TEGADERM HYDROCOLLOID THIN) MISC, Apply 1 each topically 2 (two) times a week., Disp: 10 each, Rfl: 0 .  metFORMIN (GLUCOPHAGE) 1000 MG tablet, Take 1 tablet (1,000 mg total) by mouth 2 (two) times daily with a meal., Disp: 180 tablet, Rfl: 1  Allergies  Allergen Reactions  . Amlodipine Besylate   . Codeine   . Erythromycin   . Leflunomide Other (See Comments)  . Lisinopril   . Naproxen   . Penicillins   . Pioglitazone   . Sitagliptin-Metformin Hcl Other (See Comments)  . Valsartan Other (See Comments)    Past Medical History:  Diagnosis Date  . Acquired hammer toes of both feet   . Anxiety   . Arthritis   . Diabetes mellitus without complication ([place])   . Eczema   . Environmental allergies   . Fibrocystic breast changes   . GERD (gastroesophageal reflux disease)   . Hearing loss   . Hypercholesteremia   . Hypertension   .  IBS (irritable bowel syndrome)   . Incontinence   . Memory loss   . Osteoarthritis   . PVD (peripheral vascular disease) ([place])   . Rheumatoid arthritis [facility])     Past Surgical History:  Procedure Laterality Date  . ABDOMINAL HYSTERECTOMY    . SHOULDER SURGERY     FATTY TUMOR REMOVED    Family History  Problem Relation Age of Onset  . Diabetes Mother   . Hypertension Mother   . Alzheimer's disease Mother   . Heart disease Father   . Hypertension Sister   . Diabetes Sister   . Heart disease Sister   . Hypertension Brother   . Diabetes Brother   . Cancer Sister        lung  . Heart disease Sister     Social History   Socioeconomic History  . Marital status: Divorced    Spouse name: Not on file  . Number of children: Not on file  . Years of education: Not on file  . Highest education level: Not on file  Occupational History  . Not on file  Tobacco Use  . Smoking status: Never Smoker  . Smokeless tobacco: Never Used  Substance and Sexual Activity  .  Alcohol use: No  . Drug use: No  . Sexual activity: Not on file  Other Topics Concern  . Not on file  Social History Narrative   Never Smoked   No Alcohol   No Recreational Drug Use   Retired from Surveyor, minerals work   Marital Status: Divorced since 2003   Children: 5 kids, 12 grandchildren, 2 great grandchildren   Religion: Psychologist, occupational   Social Determinants of Radio broadcast assistant Strain:   . Difficulty of Paying Living Expenses:   Food Insecurity:   . Worried About Charity fundraiser in the Last Year:   . Arboriculturist in the Last Year:   Transportation Needs:   . Film/video editor (Medical):   Marland Kitchen Lack of Transportation (Non-Medical):   Physical Activity:   . Days of Exercise per Week:   . Minutes of Exercise per Session:   Stress:   . Feeling of Stress :   Social Connections:   . Frequency of Communication with Friends and Family:   . Frequency of Social Gatherings with Friends and Family:   . Attends Religious Services:   . Active Member of Clubs or Organizations:   . Attends Archivist Meetings:   Marland Kitchen Marital Status:   Intimate Partner Violence:   . Fear of Current or Ex-Partner:   . Emotionally Abused:   Marland Kitchen Physically Abused:   . Sexually Abused:     Objective:   Today's Vitals: BP (!) 154/88   Pulse 92   Temp (!) 96.4 F (35.8 C) (Temporal)   Ht 5' 9.5" (1.765 m)   Wt 118 lb 6.4 oz (53.7 kg)   SpO2 98%   BMI 17.23 kg/m   Physical Exam Vitals reviewed.  Constitutional:      General: She is not in acute distress.    Appearance: Normal appearance. She is underweight. She is not ill-appearing, toxic-appearing or diaphoretic.  HENT:     Head: Normocephalic and atraumatic.  Eyes:     General: No scleral icterus.       Right eye: No discharge.        Left eye: No discharge.     Conjunctiva/sclera: Conjunctivae normal.  Cardiovascular:  Rate and Rhythm: Normal rate and regular rhythm.     Heart sounds: Normal heart  sounds. No murmur. No friction rub. No gallop.   Pulmonary:     Effort: Pulmonary effort is normal. No respiratory distress.     Breath sounds: Normal breath sounds. No stridor. No wheezing, rhonchi or rales.  Musculoskeletal:        General: Normal range of motion.     Cervical back: Normal range of motion.  Skin:    General: Skin is warm and dry.     Capillary Refill: Capillary refill takes less than 2 seconds.  Neurological:     General: No focal deficit present.     Mental Status: She is alert and oriented to person, place, and time. Mental status is at baseline.  Psychiatric:        Mood and Affect: Mood normal.        Behavior: Behavior normal.        Thought Content: Thought content normal.        Judgment: Judgment normal.

## 2020-03-01 NOTE — Progress Notes (Signed)
Subjective: CC: Edema PCP: Janora Norlander, DO ZOX:WRUEAV Wendy [name] is a 79 y.o. female presenting to clinic today for:  1. Edema Patient reports ongoing bilateral lower extremity edema that seems to be worse over the last month.  She intermittently keeps her legs elevated.  Her son is present with her today and notes that she often sits in the kitchen with legs down.  She is intermittently compliant with her medications.  She does not drink water much.  She reports that her urine often has a foul odor to it.  She has an appointment with her kidney specialist to establish care on 03/24/2020.  She has been wearing compression hose per her report.  She does not monitor her salt intake.  2.  Rheumatoid arthritis Patient reports that she has had many year history of rheumatoid arthritis.  She has been chronically treated with methotrexate.  She was seeing someone in [place] but has not yet reestablished care here.  Joint pain is fairly well controlled.  ROS: Per HPI  Allergies  Allergen Reactions  . Amlodipine Besylate   . Codeine   . Erythromycin   . Leflunomide Other (See Comments)  . Lisinopril   . Naproxen   . Penicillins   . Pioglitazone   . Sitagliptin-Metformin Hcl Other (See Comments)  . Valsartan Other (See Comments)   Past Medical History:  Diagnosis Date  . Acquired hammer toes of both feet   . Anxiety   . Arthritis   . Diabetes mellitus without complication ([place])   . Eczema   . Environmental allergies   . Fibrocystic breast changes   . GERD (gastroesophageal reflux disease)   . Hearing loss   . Hypercholesteremia   . Hypertension   . IBS (irritable bowel syndrome)   . Incontinence   . Memory loss   . Osteoarthritis   . PVD (peripheral vascular disease) ([place])   . Rheumatoid arthritis (HCC)     Current Outpatient Medications:  .  amLODipine (NORVASC) 5 MG tablet, Take 5 mg by mouth daily., Disp: , Rfl:  .  aspirin EC 81 MG tablet, Take 81 mg by mouth  daily., Disp: , Rfl:  .  Cholecalciferol 25 MCG (1000 UT) tablet, Take 1 tablet by mouth daily., Disp: , Rfl:  .  cilostazol (PLETAL) 100 MG tablet, Take 100 mg by mouth daily., Disp: , Rfl:  .  cloNIDine (CATAPRES - DOSED IN MG/24 HR) 0.2 mg/24hr patch, Place 0.2 mg onto the skin once a week., Disp: , Rfl:  .  cycloSPORINE (RESTASIS) 0.05 % ophthalmic emulsion, 1 drop 2 (two) times daily., Disp: , Rfl:  .  diclofenac Sodium (VOLTAREN) 1 % GEL, Apply 2 g topically daily., Disp: , Rfl:  .  ferrous sulfate 325 (65 FE) MG tablet, Take 325 mg by mouth 2 (two) times daily., Disp: , Rfl:  .  folic acid (FOLVITE) 1 MG tablet, Take 1 mg by mouth daily., Disp: , Rfl:  .  Hydroactive Dressings (TEGADERM HYDROCOLLOID THIN) MISC, Apply 1 each topically 2 (two) times a week., Disp: 10 each, Rfl: 0 .  irbesartan (AVAPRO) 300 MG tablet, Take 300 mg by mouth every morning. , Disp: , Rfl:  .  memantine (NAMENDA) 10 MG tablet, Take 1 tablet by mouth in the morning and at bedtime., Disp: , Rfl:  .  metFORMIN (GLUCOPHAGE) 1000 MG tablet, Take 1 tablet (1,000 mg total) by mouth 2 (two) times daily with a meal., Disp: 180 tablet, Rfl:  1 .  methotrexate (RHEUMATREX) 2.5 MG tablet, Take 1 tablet by mouth once a week., Disp: , Rfl:  .  Multiple Vitamin (MULTIVITAMIN WITH MINERALS) TABS tablet, Take 1 tablet by mouth daily., Disp: , Rfl:  .  nortriptyline (PAMELOR) 10 MG capsule, Take 10 mg by mouth at bedtime. 1-2 tablets nightly, Disp: , Rfl:  .  rivastigmine (EXELON) 9.5 mg/24hr, Place 1 patch onto the skin daily., Disp: , Rfl:  .  sitaGLIPtin (JANUVIA) 50 MG tablet, Take 50 mg by mouth daily., Disp: , Rfl:  Social History   Socioeconomic History  . Marital status: Divorced    Spouse name: Not on file  . Number of children: Not on file  . Years of education: Not on file  . Highest education level: Not on file  Occupational History  . Not on file  Tobacco Use  . Smoking status: Never Smoker  . Smokeless  tobacco: Never Used  Substance and Sexual Activity  . Alcohol use: No  . Drug use: No  . Sexual activity: Not on file  Other Topics Concern  . Not on file  Social History Narrative   Never Smoked   No Alcohol   No Recreational Drug Use   Retired from Surveyor, minerals work   Marital Status: Divorced since 2003   Children: 5 kids, 12 grandchildren, 64 great grandchildren   Religion: Psychologist, occupational   Social Determinants of Radio broadcast assistant Strain:   . Difficulty of Paying Living Expenses:   Food Insecurity:   . Worried About Charity fundraiser in the Last Year:   . Arboriculturist in the Last Year:   Transportation Needs:   . Film/video editor (Medical):   Marland Kitchen Lack of Transportation (Non-Medical):   Physical Activity:   . Days of Exercise per Week:   . Minutes of Exercise per Session:   Stress:   . Feeling of Stress :   Social Connections:   . Frequency of Communication with Friends and Family:   . Frequency of Social Gatherings with Friends and Family:   . Attends Religious Services:   . Active Member of Clubs or Organizations:   . Attends Archivist Meetings:   Marland Kitchen Marital Status:   Intimate Partner Violence:   . Fear of Current or Ex-Partner:   . Emotionally Abused:   Marland Kitchen Physically Abused:   . Sexually Abused:    Family History  Problem Relation Age of Onset  . Diabetes Mother   . Hypertension Mother   . Alzheimer's disease Mother   . Heart disease Father   . Hypertension Sister   . Diabetes Sister   . Heart disease Sister   . Hypertension Brother   . Diabetes Brother   . Cancer Sister        lung  . Heart disease Sister     Objective: Office vital signs reviewed. BP (!) 142/83   Pulse 94   Temp (!) 97.3 F (36.3 C) (Temporal)   Ht 5' 9.5" (1.765 m)   Wt 118 lb (53.5 kg)   SpO2 100%   BMI 17.18 kg/m   Physical Examination:  General: Awake, alert, thin elderly female. Frail appearing, No acute distress HEENT: Normal,  sclera white, MMM Cardio: regular rate and rhythm, S1S2 heard, no murmurs appreciated Pulm: clear to auscultation bilaterally, no wheezes, rhonchi or rales; normal work of breathing on room air Extremities: warm, well perfused, 1+ pitting edema to mid shins. No cyanosis  or clubbing; +1 pulses bilaterally Psych: conversive, hyperfocuses on inability to drive and need to be monitored cooking. Depression screen [facility] 2/9 03/02/2020 02/11/2020  Decreased Interest 1 0  Down, Depressed, Hopeless 1 0  PHQ - 2 Score 2 0  Altered sleeping 0 -  Tired, decreased energy 0 -  Change in appetite 2 -  Feeling bad or failure about yourself  0 -  Trouble concentrating 0 -  Moving slowly or fidgety/restless 0 -  Suicidal thoughts 0 -  PHQ-9 Score 4 -  Difficult doing work/chores Somewhat difficult -   Assessment/ Plan: 79 y.o. female   1. Lower extremity edema Likely combination of venous stasis/gravity dependent edema in combination with CKD 3 and salt intake.  I reinforced elevation of the legs, compression hose and salt restriction.  Increase water intake. - Basic Metabolic Panel - Anemia panel - TSH  2. Anemia, unspecified type Possibly also contributing to the above. - Anemia panel  3. Abnormal urine odor I have given a urinalysis cup to the patient's son and he will have this returned within the next few hours - Urinalysis, Complete; Future - Urine Culture; Future  4. Rheumatoid arthritis involving multiple sites, unspecified whether rheumatoid factor present [facility]) Referral to rheumatology placed - Ambulatory referral to Rheumatology  5. Stage 4 chronic kidney disease ([place]) Patient is borderline stage IIIb-stage IV chronic kidney disease.  She has a referral to nephrology and has an appointment coming up in about 2 to 3 weeks.  I have asked her to go ahead and hold the Januvia and Metformin for now as I think that her kidney function is too borderline.  I have repeated her BMP today and we  will determine the appropriateness of her diabetic medications pending this lab.  Her son voiced good understanding of the plan and will follow up pending these labs.   No orders of the defined types were placed in this encounter.  No orders of the defined types were placed in this encounter.    Janora Norlander, DO [place] [phone]

## 2020-03-02 ENCOUNTER — Ambulatory Visit: Payer: Medicare PPO | Admitting: Family Medicine

## 2020-03-02 ENCOUNTER — Other Ambulatory Visit: Payer: Self-pay

## 2020-03-02 ENCOUNTER — Encounter: Payer: Self-pay | Admitting: Family Medicine

## 2020-03-02 VITALS — BP 142/83 | HR 94 | Temp 97.3°F | Ht 69.5 in | Wt 118.0 lb

## 2020-03-02 DIAGNOSIS — M069 Rheumatoid arthritis, unspecified: Secondary | ICD-10-CM | POA: Diagnosis not present

## 2020-03-02 DIAGNOSIS — R600 Localized edema: Secondary | ICD-10-CM | POA: Diagnosis not present

## 2020-03-02 DIAGNOSIS — R8290 Unspecified abnormal findings in urine: Secondary | ICD-10-CM

## 2020-03-02 DIAGNOSIS — D649 Anemia, unspecified: Secondary | ICD-10-CM

## 2020-03-02 DIAGNOSIS — N184 Chronic kidney disease, stage 4 (severe): Secondary | ICD-10-CM

## 2020-03-02 NOTE — Patient Instructions (Signed)
HOLD Metformin and Januvia until you are called.  Her kidney function is too poor.   Referral to rheumatology placed.  Watch salt.  Increase water.

## 2020-03-03 LAB — ANEMIA PANEL
Ferritin: 306 ng/mL — ABNORMAL HIGH (ref 15–150)
Folate, Hemolysate: 313 ng/mL
Folate, RBC: 1023 ng/mL (ref >498)
Hematocrit: 30.6 % — ABNORMAL LOW (ref 34.0–46.6)
Iron Saturation: 28 % (ref 15–55)
Iron: 92 ug/dL (ref 27–139)
Retic Ct Pct: 1 % (ref 0.6–2.6)
Total Iron Binding Capacity: 323 ug/dL (ref 250–450)
UIBC: 231 ug/dL (ref 118–369)
Vitamin B-12: >2000 pg/mL — ABNORMAL HIGH (ref 232–1245)

## 2020-03-03 LAB — BASIC METABOLIC PANEL
BUN/Creatinine Ratio: 17 (ref 12–28)
BUN: 22 mg/dL (ref 8–27)
CO2: 21 mmol/L (ref 20–29)
Calcium: 10.5 mg/dL — ABNORMAL HIGH (ref 8.7–10.3)
Chloride: 105 mmol/L (ref 96–106)
Creatinine, Ser: 1.3 mg/dL — ABNORMAL HIGH (ref 0.57–1.00)
GFR calc Af Amer: 45 mL/min/{1.73_m2} — ABNORMAL LOW (ref >59)
GFR calc non Af Amer: 39 mL/min/{1.73_m2} — ABNORMAL LOW (ref >59)
Glucose: 122 mg/dL — ABNORMAL HIGH (ref 65–99)
Potassium: 5.2 mmol/L (ref 3.5–5.2)
Sodium: 141 mmol/L (ref 134–144)

## 2020-03-03 LAB — TSH: TSH: 1.18 u[IU]/mL (ref 0.450–4.500)

## 2020-03-07 ENCOUNTER — Telehealth: Payer: Self-pay | Admitting: Family Medicine

## 2020-03-07 ENCOUNTER — Other Ambulatory Visit: Payer: Medicare PPO

## 2020-03-07 ENCOUNTER — Other Ambulatory Visit: Payer: Self-pay

## 2020-03-07 ENCOUNTER — Other Ambulatory Visit: Payer: Self-pay | Admitting: Family Medicine

## 2020-03-07 DIAGNOSIS — R8290 Unspecified abnormal findings in urine: Secondary | ICD-10-CM

## 2020-03-07 LAB — URINALYSIS, COMPLETE
Bilirubin, UA: NEGATIVE
Ketones, UA: NEGATIVE
Nitrite, UA: POSITIVE — AB
Specific Gravity, UA: 1.02 (ref 1.005–1.030)
Urobilinogen, Ur: 0.2 mg/dL (ref 0.2–1.0)
pH, UA: 6 (ref 5.0–7.5)

## 2020-03-07 LAB — MICROSCOPIC EXAMINATION: WBC, UA: >30 /hpf — AB (ref 0–5)

## 2020-03-07 MED ORDER — CEPHALEXIN 250 MG PO CAPS: 250.0000 mg | ORAL_CAPSULE | Freq: Two times a day (BID) | ORAL | 0 refills | Status: AC

## 2020-03-07 MED ORDER — METFORMIN HCL 1000 MG PO TABS: 500.0000 mg | ORAL_TABLET | Freq: Two times a day (BID) | ORAL | 1 refills | Status: DC

## 2020-03-07 NOTE — Telephone Encounter (Signed)
Please advise on question of metformin.

## 2020-03-07 NOTE — Telephone Encounter (Signed)
Based on her most recent renal function panel, she can go to half a tablet twice a day of her current Metformin.  This should be safe to take.

## 2020-03-07 NOTE — Telephone Encounter (Signed)
Aware. 

## 2020-03-07 NOTE — Telephone Encounter (Signed)
Left message to please call our office. 

## 2020-03-09 LAB — URINE CULTURE

## 2020-03-15 ENCOUNTER — Ambulatory Visit: Payer: Medicare PPO | Admitting: Family Medicine

## 2020-03-17 ENCOUNTER — Encounter: Payer: Self-pay | Admitting: Family Medicine

## 2020-03-21 ENCOUNTER — Other Ambulatory Visit: Payer: Self-pay | Admitting: Family Medicine

## 2020-03-21 NOTE — Telephone Encounter (Signed)
This was filled last by old provider, not on med list yet. Last OV 03/02/20

## 2020-03-21 NOTE — Telephone Encounter (Signed)
°  Prescription Request  03/21/2020  What is the name of the medication or equipment? Fenofibrate  Have you contacted your pharmacy to request a refill? (if applicable) No  Which pharmacy would you like this sent to? [facility]   Patient notified that their request is being sent to the clinical staff for review and that they should receive a response within 2 business days.   [facility] pt.  Son called, so please call him.

## 2020-03-21 NOTE — Telephone Encounter (Signed)
Please inform her son that apparently this medication is contraindicated based on her kidney function.  Would not recommend continuing.

## 2020-03-21 NOTE — Telephone Encounter (Signed)
Please have pharmacy send refill request, as this is not on her med list and I have no idea what mg she is taking.

## 2020-03-21 NOTE — Progress Notes (Signed)
Unknown dose of fenofibrate.  Waiting for pharmacy for refill request

## 2020-03-21 NOTE — Telephone Encounter (Signed)
Ok to use OTC fish oils.  This should be relatively safe.

## 2020-03-21 NOTE — Telephone Encounter (Signed)
Son aware and verbalizes understanding - would like to know if something else will be prescribed to replace the medication? Please advise and send back to pools

## 2020-03-22 NOTE — Telephone Encounter (Signed)
Son aware.

## 2020-03-24 ENCOUNTER — Other Ambulatory Visit (HOSPITAL_COMMUNITY): Payer: Self-pay | Admitting: Nephrology

## 2020-03-24 ENCOUNTER — Other Ambulatory Visit: Payer: Self-pay | Admitting: Nephrology

## 2020-03-24 DIAGNOSIS — R809 Proteinuria, unspecified: Secondary | ICD-10-CM

## 2020-03-24 DIAGNOSIS — D631 Anemia in chronic kidney disease: Secondary | ICD-10-CM

## 2020-03-24 DIAGNOSIS — E1122 Type 2 diabetes mellitus with diabetic chronic kidney disease: Secondary | ICD-10-CM

## 2020-03-29 ENCOUNTER — Other Ambulatory Visit (HOSPITAL_COMMUNITY): Payer: Self-pay | Admitting: Nephrology

## 2020-03-29 DIAGNOSIS — I10 Essential (primary) hypertension: Secondary | ICD-10-CM

## 2020-03-30 ENCOUNTER — Other Ambulatory Visit: Payer: Medicare PPO

## 2020-03-30 ENCOUNTER — Other Ambulatory Visit: Payer: Self-pay

## 2020-03-31 ENCOUNTER — Ambulatory Visit (HOSPITAL_COMMUNITY): Payer: Medicare PPO

## 2020-03-31 ENCOUNTER — Encounter (HOSPITAL_COMMUNITY): Payer: Self-pay

## 2020-04-06 ENCOUNTER — Ambulatory Visit (HOSPITAL_COMMUNITY): Payer: Medicare PPO | Attending: Family Medicine

## 2020-04-08 ENCOUNTER — Other Ambulatory Visit: Payer: Medicare PPO

## 2020-04-08 ENCOUNTER — Other Ambulatory Visit: Payer: Self-pay

## 2020-04-08 DIAGNOSIS — R809 Proteinuria, unspecified: Secondary | ICD-10-CM | POA: Diagnosis not present

## 2020-04-08 DIAGNOSIS — E1121 Type 2 diabetes mellitus with diabetic nephropathy: Secondary | ICD-10-CM | POA: Diagnosis not present

## 2020-04-08 DIAGNOSIS — E1129 Type 2 diabetes mellitus with other diabetic kidney complication: Secondary | ICD-10-CM | POA: Diagnosis not present

## 2020-04-08 DIAGNOSIS — I129 Hypertensive chronic kidney disease with stage 1 through stage 4 chronic kidney disease, or unspecified chronic kidney disease: Secondary | ICD-10-CM | POA: Diagnosis not present

## 2020-04-08 DIAGNOSIS — E1122 Type 2 diabetes mellitus with diabetic chronic kidney disease: Secondary | ICD-10-CM | POA: Diagnosis not present

## 2020-04-08 DIAGNOSIS — N189 Chronic kidney disease, unspecified: Secondary | ICD-10-CM | POA: Diagnosis not present

## 2020-04-08 DIAGNOSIS — R600 Localized edema: Secondary | ICD-10-CM | POA: Diagnosis not present

## 2020-04-08 DIAGNOSIS — D638 Anemia in other chronic diseases classified elsewhere: Secondary | ICD-10-CM | POA: Diagnosis not present

## 2020-04-11 ENCOUNTER — Telehealth: Payer: Self-pay | Admitting: Family Medicine

## 2020-04-11 NOTE — Telephone Encounter (Signed)
  Prescription Request  04/11/2020  What is the name of the medication or equipment?  Januvia 50mg  tablet Cilostazol 100mg  tablet Memantine Hydrochloride 10mg  tablet Rivastigimine 9.5mg  24hr patch Clonidine 0.2 mg/day patch Methotrexate 2.5 mg tablet  Have you contacted your pharmacy to request a refill? (if applicable) no  Which pharmacy would you like this sent to? [facility]   Patient notified that their request is being sent to the clinical staff for review and that they should receive a response within 2 business days.

## 2020-04-11 NOTE — Telephone Encounter (Signed)
Current labs done march and April.  Last office visits 02-05-20 and 03-02-20.  Please advise on medication refills.

## 2020-04-13 ENCOUNTER — Other Ambulatory Visit: Payer: Self-pay | Admitting: Family Medicine

## 2020-04-13 MED ORDER — IRBESARTAN 300 MG PO TABS: 300.0000 mg | ORAL_TABLET | Freq: Every morning | ORAL | 3 refills | Status: DC

## 2020-04-13 MED ORDER — CILOSTAZOL 100 MG PO TABS: 100.0000 mg | ORAL_TABLET | Freq: Every day | ORAL | 3 refills | Status: DC

## 2020-04-13 MED ORDER — RIVASTIGMINE 9.5 MG/24HR TD PT24: 9.5000 mg | MEDICATED_PATCH | Freq: Every day | TRANSDERMAL | 3 refills | Status: DC

## 2020-04-13 MED ORDER — MEMANTINE HCL 10 MG PO TABS: 10.0000 mg | ORAL_TABLET | Freq: Two times a day (BID) | ORAL | 3 refills | Status: DC

## 2020-04-13 MED ORDER — CLONIDINE 0.2 MG/24HR TD PTWK: 0.2000 mg | MEDICATED_PATCH | TRANSDERMAL | 3 refills | Status: DC

## 2020-04-13 MED ORDER — SITAGLIPTIN PHOSPHATE 50 MG PO TABS: 50.0000 mg | ORAL_TABLET | Freq: Every day | ORAL | 3 refills | Status: DC

## 2020-04-13 MED ORDER — AMLODIPINE BESYLATE 5 MG PO TABS: 5.0000 mg | ORAL_TABLET | Freq: Every day | ORAL | 3 refills | Status: DC

## 2020-04-13 MED ORDER — CYCLOSPORINE 0.05 % OP EMUL: 1.0000 [drp] | Freq: Two times a day (BID) | OPHTHALMIC | 5 refills | Status: DC

## 2020-04-13 NOTE — Telephone Encounter (Signed)
Patient aware and verbalized understanding. °

## 2020-04-13 NOTE — Telephone Encounter (Signed)
Spoke with Patient (who answered son's telephone number) - She was given number to Rheumatology office to call and schedule appt.

## 2020-04-13 NOTE — Telephone Encounter (Signed)
Refills have been sent.  She was referred to rheum last visit and apparently there was quite a bit of difficulty reaching her son for appt.  Please give him the number for the rheumatologist so that he can make appt, as Methotrexate must be managed by speciality.

## 2020-04-15 ENCOUNTER — Telehealth: Payer: Self-pay | Admitting: Family Medicine

## 2020-04-15 NOTE — Telephone Encounter (Signed)
Aware.  She must check with specialist for urine results done by them.

## 2020-04-18 DIAGNOSIS — R2689 Other abnormalities of gait and mobility: Secondary | ICD-10-CM | POA: Diagnosis not present

## 2020-04-21 ENCOUNTER — Ambulatory Visit (HOSPITAL_COMMUNITY)
Admission: RE | Admit: 2020-04-21 | Discharge: 2020-04-21 | Disposition: A | Discharge status: Home / Self Care | Payer: Medicare PPO | Source: Ambulatory Visit | Attending: Nephrology | Admitting: Nephrology

## 2020-04-21 ENCOUNTER — Other Ambulatory Visit: Payer: Self-pay

## 2020-04-21 DIAGNOSIS — N189 Chronic kidney disease, unspecified: Secondary | ICD-10-CM | POA: Insufficient documentation

## 2020-04-21 DIAGNOSIS — E1122 Type 2 diabetes mellitus with diabetic chronic kidney disease: Secondary | ICD-10-CM | POA: Diagnosis not present

## 2020-04-21 DIAGNOSIS — D631 Anemia in chronic kidney disease: Secondary | ICD-10-CM | POA: Diagnosis not present

## 2020-04-21 DIAGNOSIS — R809 Proteinuria, unspecified: Secondary | ICD-10-CM | POA: Diagnosis not present

## 2020-04-26 ENCOUNTER — Telehealth: Payer: Self-pay | Admitting: Family Medicine

## 2020-04-26 NOTE — Telephone Encounter (Signed)
  Prescription Request  04/26/2020  What is the name of the medication or equipment? methotrexate  Have you contacted your pharmacy to request a refill? (if applicable) yes  Which pharmacy [facility] you [facility] sent to[facility]   Patient notified that their request is being sent to the clinical staff for review and that they should receive a response within 2 business days.

## 2020-04-26 NOTE — Telephone Encounter (Signed)
Yes she Scheduled 08/15/20

## 2020-04-26 NOTE — Telephone Encounter (Signed)
  REFERRAL REQUEST Telephone Note 04/26/2020  What type of referral do you need? psychologist  Have you been seen at our office for this problem? yes (Advise that they may need an appointment with their PCP before a referral can be done)  Is there a particular doctor or location that you prefer? no  Patient notified that referrals can take up to a week or longer to process. If they haven't heard anything within a week they should call back and speak with the referral department.

## 2020-04-26 NOTE — Telephone Encounter (Signed)
Pt's son was supposed to call rheum office back.  This is a specialty med that has to be managed by rheumatology.  Did he schedule her an appointment?

## 2020-04-27 ENCOUNTER — Other Ambulatory Visit: Payer: Self-pay | Admitting: Family Medicine

## 2020-04-27 DIAGNOSIS — D638 Anemia in other chronic diseases classified elsewhere: Secondary | ICD-10-CM | POA: Diagnosis not present

## 2020-04-27 DIAGNOSIS — R413 Other amnesia: Secondary | ICD-10-CM

## 2020-04-27 DIAGNOSIS — R600 Localized edema: Secondary | ICD-10-CM | POA: Diagnosis not present

## 2020-04-27 DIAGNOSIS — N189 Chronic kidney disease, unspecified: Secondary | ICD-10-CM | POA: Diagnosis not present

## 2020-04-27 DIAGNOSIS — R809 Proteinuria, unspecified: Secondary | ICD-10-CM | POA: Diagnosis not present

## 2020-04-27 DIAGNOSIS — I129 Hypertensive chronic kidney disease with stage 1 through stage 4 chronic kidney disease, or unspecified chronic kidney disease: Secondary | ICD-10-CM | POA: Diagnosis not present

## 2020-04-27 DIAGNOSIS — E1129 Type 2 diabetes mellitus with other diabetic kidney complication: Secondary | ICD-10-CM | POA: Diagnosis not present

## 2020-04-27 DIAGNOSIS — E875 Hyperkalemia: Secondary | ICD-10-CM | POA: Diagnosis not present

## 2020-04-27 DIAGNOSIS — E1122 Type 2 diabetes mellitus with diabetic chronic kidney disease: Secondary | ICD-10-CM | POA: Diagnosis not present

## 2020-04-27 NOTE — Telephone Encounter (Signed)
Referrla placed

## 2020-04-27 NOTE — Telephone Encounter (Signed)
Family member ask for wrong referral.  He says patient has not been able to drive due to mind being "foggy" and needs a referral to Neurologist for evaluation and hopefully documentation to help her drive again.

## 2020-04-27 NOTE — Telephone Encounter (Signed)
She does not need referral to psychologist.  She can schedule on of her choosing  Your provider wants you to schedule an appointment with a Psychologist/Psychiatrist. The following list of offices requires the patient to call and make their own appointment, as there is information they need that only you can provide. Please feel free to choose form the following providers:  [facility] [place]  [place]  [phone] [place], [place]  (Scheduled through [facility]) Must call and do an interview for appointment. Sees Children / Accepts Medicaid  [facility] [place]  [address] [place]    [place], [place]  [phone] [place], [place] for Autism but does not treat it Sees Children / Accepts Medicaid  [facility]    [phone] [address] [place], [place] Medication management, substance abuse, bipolar, grief, family, marriage, OCD, anxiety, PTSD Sees children / Accepts Medicaid  [facility]    [phone] [address], [place], [place] children / Accepts [facility]  [facility]  [phone] [address] [place], [place]   Dr Lorenza Evangelist     [phone] [address] [place], [place]  Sees ADD & ADHD for treatment Accepts Medicaid  [facility]  [phone] [address] Arlean Hopping, [place] for Autism Accepts [facility]  [facility]  [phone] [place], [place]  Does Adult ADD evaluations Does not accept Medicaid  [facility]   [phone] [place], [place] children as young as 76 years old Accepts [facility]     [phone]    [place], [place]  [phone] Sees children Accepts Medicaid

## 2020-04-28 NOTE — Telephone Encounter (Signed)
Patient aware.

## 2020-05-03 ENCOUNTER — Encounter: Payer: Self-pay | Admitting: Nurse Practitioner

## 2020-05-03 ENCOUNTER — Ambulatory Visit: Payer: Medicare PPO | Admitting: Nurse Practitioner

## 2020-05-03 ENCOUNTER — Other Ambulatory Visit: Payer: Self-pay

## 2020-05-03 VITALS — BP 157/87 | HR 68 | Temp 98.3°F | Resp 20 | Ht 69.5 in | Wt 121.0 lb

## 2020-05-03 DIAGNOSIS — R413 Other amnesia: Secondary | ICD-10-CM | POA: Diagnosis not present

## 2020-05-03 DIAGNOSIS — I10 Essential (primary) hypertension: Secondary | ICD-10-CM | POA: Diagnosis not present

## 2020-05-03 MED ORDER — CLONIDINE 0.3 MG/24HR TD PTWK: 0.3000 mg | MEDICATED_PATCH | TRANSDERMAL | 0 refills | Status: DC

## 2020-05-03 MED ORDER — METHOTREXATE 2.5 MG PO TABS: 2.5000 mg | ORAL_TABLET | ORAL | 0 refills | Status: DC

## 2020-05-03 NOTE — Assessment & Plan Note (Signed)
Patient is presenting with memory loss Per caregiver. MMSE completed patient scores 29, provided education to patient and caregiver with printed handout given to caregiver.  Patient has  prior neurology consult. labs completed: CBC, CMP, TSH, sedimentation rate, syphilis, B12, folate and TSH. New referral will be sent to neurology.

## 2020-05-03 NOTE — Assessment & Plan Note (Signed)
Patient is a 79 year old female who presents today with elevated blood pressures.  Patient currently is using clonidine patches 0.2 mg weekly.  Provided education to patient and caregiver to take blood pressure logs throughout this week.  Changed clonidine dose to 0.3 mg weekly.  Patient will follow up in 2 weeks and call blood pressure log to clinic in 1 week. Rx sent to pharmacy.

## 2020-05-03 NOTE — Progress Notes (Signed)
Established Patient Office Visit  Subjective:  Patient ID: Wendy [name], female    DOB: 18-Dec-1940  Age: 79 y.o. MRN: [id number]  CC:  Chief Complaint  Patient presents with  . Memory Loss    wants referral to Neurology     HPI Wendy [name] presents  for follow up of hypertension. Patient was diagnosed in 2009 The patient is tolerating current  medication well.  Clonidine 0.2 mg weekly without side effects.  Patient's blood pressure is still elevated.  Compliance with treatment has been good; including taking medication as directed , maintains a healthy diet and regular exercise regimen , and following up as directed.  Concerning patient's memory loss, son has reported patient is having intermittent forgetfulness which has worsened in the last couple of weeks.  Patient had prior neurology referral and appointment scheduled that was was not completed due to prior work-up.  Patient is here today to complete MMSE, lab work and new referral back to neurology.   Past Medical History:  Diagnosis Date  . Acquired hammer toes of both feet   . Anxiety   . Arthritis   . Diabetes mellitus without complication ([facility])   . Eczema   . Environmental allergies   . Fibrocystic breast changes   . GERD (gastroesophageal reflux disease)   . Hearing loss   . Hypercholesteremia   . Hypertension   . IBS (irritable bowel syndrome)   . Incontinence   . Memory loss   . Osteoarthritis   . PVD (peripheral vascular disease) ([facility])   . Rheumatoid arthritis [facility])     Past Surgical History:  Procedure Laterality Date  . ABDOMINAL HYSTERECTOMY    . SHOULDER SURGERY     FATTY TUMOR REMOVED    Family History  Problem Relation Age of Onset  . Diabetes Mother   . Hypertension Mother   . Alzheimer's disease Mother   . Heart disease Father   . Hypertension Sister   . Diabetes Sister   . Heart disease Sister   . Hypertension Brother   . Diabetes Brother   . Cancer Sister        lung  . Heart  disease Sister     Social History   Socioeconomic History  . Marital status: Divorced    Spouse name: Not on file  . Number of children: Not on file  . Years of education: Not on file  . Highest education level: Not on file  Occupational History  . Not on file  Tobacco Use  . Smoking status: Never Smoker  . Smokeless tobacco: Never Used  Substance and Sexual Activity  . Alcohol use: No  . Drug use: No  . Sexual activity: Not on file  Other Topics Concern  . Not on file  Social History Narrative   Never Smoked   No Alcohol   No Recreational Drug Use   Retired from Surveyor, [employer] work   Marital Status: Divorced since 2003   Children: 5 kids, 12 grandchildren, 19 great grandchildren   Religion: Psychologist, occupational   Social Determinants of Radio broadcast assistant Strain:   . Difficulty of Paying Living Expenses:   Food Insecurity:   . Worried About Charity fundraiser in the Last Year:   . Arboriculturist in the Last Year:   Transportation Needs:   . Film/video editor (Medical):   Marland Kitchen Lack of Transportation (Non-Medical):   Physical Activity:   . Days of Exercise  per Week:   . Minutes of Exercise per Session:   Stress:   . Feeling of Stress :   Social Connections:   . Frequency of Communication with Friends and Family:   . Frequency of Social Gatherings with Friends and Family:   . Attends Religious Services:   . Active Member of Clubs or Organizations:   . Attends Archivist Meetings:   Marland Kitchen Marital Status:   Intimate Partner Violence:   . Fear of Current or Ex-Partner:   . Emotionally Abused:   Marland Kitchen Physically Abused:   . Sexually Abused:     Outpatient Medications Prior to Visit  Medication Sig Dispense Refill  . aspirin EC 81 MG tablet Take 81 mg by mouth daily.    . Cholecalciferol 25 MCG (1000 UT) tablet Take 1 tablet by mouth daily.    . cilostazol (PLETAL) 100 MG tablet Take 1 tablet (100 mg total) by mouth daily. 90 tablet 3  . cloNIDine  (CATAPRES - DOSED IN MG/24 HR) 0.2 mg/24hr patch Place 1 patch (0.2 mg total) onto the skin once a week. 12 patch 3  . cycloSPORINE (RESTASIS) 0.05 % ophthalmic emulsion Place 1 drop into both eyes 2 (two) times daily. 0.4 mL 5  . diclofenac Sodium (VOLTAREN) 1 % GEL Apply 2 g topically daily.    . folic acid (FOLVITE) 1 MG tablet Take 1 mg by mouth daily.    . irbesartan (AVAPRO) 300 MG tablet Take 1 tablet (300 mg total) by mouth every morning. 90 tablet 3  . memantine (NAMENDA) 10 MG tablet Take 1 tablet (10 mg total) by mouth in the morning and at bedtime. 90 tablet 3  . metFORMIN (GLUCOPHAGE) 1000 MG tablet Take 0.5 tablets (500 mg total) by mouth 2 (two) times daily with a meal. Renal dose 180 tablet 1  . methotrexate (RHEUMATREX) 2.5 MG tablet Take 1 tablet by mouth once a week.    . nortriptyline (PAMELOR) 10 MG capsule Take 10 mg by mouth at bedtime. 1-2 tablets nightly    . rivastigmine (EXELON) 9.5 mg/24hr Place 1 patch (9.5 mg total) onto the skin daily. 12 patch 3  . sitaGLIPtin (JANUVIA) 50 MG tablet Take 1 tablet (50 mg total) by mouth daily. 90 tablet 3  . amLODipine (NORVASC) 5 MG tablet Take 1 tablet (5 mg total) by mouth daily. 90 tablet 3  . ferrous sulfate 325 (65 FE) MG tablet Take 325 mg by mouth 2 (two) times daily.    . Hydroactive Dressings (TEGADERM HYDROCOLLOID THIN) MISC Apply 1 each topically 2 (two) times a week. 10 each 0  . Multiple Vitamin (MULTIVITAMIN WITH MINERALS) TABS tablet Take 1 tablet by mouth daily.     No facility-administered medications prior to visit.    Allergies  Allergen Reactions  . Codeine   . Erythromycin   . Leflunomide Other (See Comments)  . Lisinopril   . Naproxen   . Penicillins   . Pioglitazone   . Valsartan Other (See Comments)   MMSE - Mini Mental State Exam 05/03/2020  Orientation to time 5  Orientation to Place 5  Registration 3  Attention/ Calculation 5  Recall 2  Language- name 2 objects 2  Language- repeat 1   Language- follow 3 step command 3  Language- read & follow direction 1  Write a sentence 1  Copy design 1  Total score 29   ROS Review of Systems  Constitutional: Negative.   HENT: Negative.   Eyes:  Negative.   Respiratory: Negative.   Cardiovascular: Negative.   Gastrointestinal: Negative.   Genitourinary: Negative.   Musculoskeletal: Positive for arthralgias.  Skin: Negative for rash and wound.  Neurological: Negative for dizziness and headaches.  Psychiatric/Behavioral: The patient is not nervous/anxious.       Objective:    Physical Exam  Constitutional: She is oriented to person, place, and time. She appears well-developed and well-nourished.  HENT:  Head: Normocephalic.  Mouth/Throat: Oropharynx is clear and moist.  Eyes: Conjunctivae are normal.  Cardiovascular: Normal rate, regular rhythm and normal heart sounds.  Pulmonary/Chest: Breath sounds normal.  Abdominal: Bowel sounds are normal.  Musculoskeletal:        General: Tenderness present.  Neurological: She is alert and oriented to person, place, and time.  Care giver reporting intermittent forgetfullness  Skin: No rash noted. No erythema.  Psychiatric: She has a normal mood and affect. Her behavior is normal. Judgment and thought content normal.    BP (!) 157/87   Pulse 68   Temp 98.3 F (36.8 C)   Resp 20   Ht 5' 9.5" (1.765 m)   Wt 121 lb (54.9 kg)   SpO2 98%   BMI 17.61 kg/m  Wt Readings from Last 3 Encounters:  05/03/20 121 lb (54.9 kg)  03/02/20 118 lb (53.5 kg)  02/11/20 118 lb 6.4 oz (53.7 kg)     Health Maintenance Due  Topic Date Due  . FOOT EXAM  Never done  . OPHTHALMOLOGY EXAM  Never done  . COVID-19 Vaccine (1) Never done  . DEXA SCAN  Never done    There are no preventive care reminders to display for this patient.  Lab Results  Component Value Date   TSH 1.180 03/02/2020   Lab Results  Component Value Date   WBC 7.7 02/11/2020   HGB 9.0 (L) 02/11/2020   HCT 30.6  (L) 03/02/2020   MCV 73 (L) 02/11/2020   PLT 634 (H) 02/11/2020   Lab Results  Component Value Date   NA 141 03/02/2020   K 5.2 03/02/2020   CO2 21 03/02/2020   GLUCOSE 122 (H) 03/02/2020   BUN 22 03/02/2020   CREATININE 1.30 (H) 03/02/2020   BILITOT 0.5 02/11/2020   ALKPHOS 32 (L) 02/11/2020   AST 30 02/11/2020   ALT 24 02/11/2020   PROT 6.9 02/11/2020   ALBUMIN 3.9 02/11/2020   CALCIUM 10.5 (H) 03/02/2020   Lab Results  Component Value Date   CHOL 173 02/11/2020   Lab Results  Component Value Date   HDL 63 02/11/2020   Lab Results  Component Value Date   LDLCALC 96 02/11/2020   Lab Results  Component Value Date   TRIG 72 02/11/2020   Lab Results  Component Value Date   CHOLHDL 2.7 02/11/2020   Lab Results  Component Value Date   HGBA1C 7.4 (H) 02/11/2020      Assessment & Plan:  Essential hypertension Patient is a 79 year old female who presents today with elevated blood pressures.  Patient currently is using clonidine patches 0.2 mg weekly.  Provided education to patient and caregiver to take blood pressure logs throughout this week.  Changed clonidine dose to 0.3 mg weekly.  Patient will follow up in 2 weeks and call blood pressure log to clinic in 1 week. Rx sent to pharmacy.  Memory loss Patient is presenting with memory loss Per caregiver. MMSE completed patient scores 29, provided education to patient and caregiver with printed handout given to caregiver.  Patient has  prior neurology consult. labs completed: CBC, CMP, TSH, sedimentation rate, syphilis, B12, folate and TSH. New referral will be sent to neurology.  Problem List Items Addressed This Visit      Cardiovascular and Mediastinum   Essential hypertension     Other   Memory loss - Primary   Relevant Orders   CBC with Differential/Platelet   CMP14+EGFR   Vitamin B12   TSH   Folate   Sedimentation Rate   RPR      Meds ordered this encounter  Medications  . methotrexate  (RHEUMATREX) 2.5 MG tablet    Sig: Take 1 tablet (2.5 mg total) by mouth once a week.    Dispense:  12 tablet    Refill:  0    Order Specific Question:   Supervising Provider    Answer:   Caryl Pina A [id number]  . cloNIDine (CATAPRES - DOSED IN MG/24 HR) 0.3 mg/24hr patch    Sig: Place 1 patch (0.3 mg total) onto the skin once a week.    Dispense:  4 patch    Refill:  0    Order Specific Question:   Supervising Provider    Answer:   Caryl Pina A [[id number]]    Follow-up: Return in about 2 weeks (around 05/17/2020) for Changed medication dose ( Blood pressure).    Ivy Lynn, NP

## 2020-05-03 NOTE — Patient Instructions (Addendum)
Essential hypertension Patient is a 79 year old female who presents today with elevated blood pressures.  Patient currently is using clonidine patches 0.2 mg weekly.  Provided education to patient and caregiver to take blood pressure logs throughout this week.  Changed clonidine dose to 0.3 mg weekly.  Patient will follow up in 2 weeks and call blood pressure log to clinic in 1 week. Rx sent to pharmacy.  Memory loss Patient is presenting with memory loss Per caregiver. MMSE completed patient scores 29, provided education to patient and caregiver with printed handout given to caregiver.  Patient has  prior neurology consult. labs completed: CBC, CMP, TSH, sedimentation rate, syphilis, B12, folate and TSH. New referral will be sent to neurology.   Dementia Dementia is a condition that affects the way the brain functions. It often affects memory and thinking. Usually, dementia gets worse with time and cannot be reversed (progressive dementia). There are many types of dementia, including:  Alzheimer's disease. This type is the most common.  Vascular dementia. This type may happen as the result of a stroke.  Lewy body dementia. This type may happen to people who have Parkinson's disease.  Frontotemporal dementia. This type is caused by damage to nerve cells (neurons) in certain parts of the brain. Some people may be affected by more than one type of dementia. This is called mixed dementia. What are the causes? Dementia is caused by damage to cells in the brain. The area of the brain and the types of cells damaged determine the type of dementia. Usually, this damage is irreversible or cannot be undone. Some examples of irreversible causes include:  Conditions that affect the blood vessels of the brain, such as diabetes, heart disease, or blood vessel disease.  Genetic mutations. In some cases, changes in the brain may be caused by another condition and can be reversed or slowed. Some examples  of reversible causes include:  Injury to the brain.  Certain medicines.  Infection, such as meningitis.  Metabolic problems, such as vitamin B12 deficiency or thyroid disease.  Pressure on the brain, such as from a tumor or blood clot. What are the signs or symptoms? Symptoms of dementia depend on the type of dementia. Common signs of dementia include problems with remembering, thinking, problem solving, decision making, and communicating. These signs develop slowly or get worse with time. This may include:  Problems remembering things.  Having trouble taking a bath or putting clothes on.  Forgetting appointments.  Forgetting to pay bills.  Difficulty planning and preparing meals.  Having trouble speaking.  Getting lost easily. How is this diagnosed? This condition is diagnosed by a specialist (neurologist). It is diagnosed based on the history of your symptoms, your medical history, a physical exam, and tests. Tests may include:  Tests to evaluate brain function, such as memory tests, cognitive tests, and other tests.  Lab tests, such as blood or urine tests.  Imaging tests, such as a CT scan, a PET scan, or an MRI.  Genetic testing. This may be done if other family members have a diagnosis of certain types of dementia. Your health care provider will talk with you and your family, friends, or caregivers about your history and symptoms. How is this treated?  Treatment for this condition depends on the cause of the dementia. Progressive dementias, such as Alzheimer's disease, cannot be cured, but there may be treatments that help to manage symptoms. Treatment might involve taking medicines that may help to:  Control the dementia.  Slow down the progression of the dementia.  Manage symptoms. In some cases, treating the cause of your dementia can improve symptoms, reverse symptoms, or slow down how quickly your dementia becomes worse. Your health care provider can direct  you to support groups, organizations, and other health care providers who can help with decisions about your care. Follow these instructions at home: Medicines  Take over-the-counter and prescription medicines only as told by your health care provider.  Use a pill organizer or pill reminder to help you manage your medicines.  Avoid taking medicines that can affect thinking, such as pain medicines or sleeping medicines. Lifestyle  Make healthy lifestyle choices. ? Be physically active as told by your health care provider. ? Do not use any products that contain nicotine or tobacco, such as cigarettes, e-cigarettes, and chewing tobacco. If you need help quitting, ask your health care provider. ? Do not drink alcohol. ? Practice stress-management techniques when you get stressed. ? Spend time with other people.  Make sure to get quality sleep. These tips can help you get a good night's rest: ? Avoid napping during the day. ? Keep your sleeping area dark and cool. ? Avoid exercising during the few hours before you go to bed. ? Avoid caffeine products in the evening. Eating and drinking  Drink enough fluid to keep your urine pale yellow.  Eat a healthy diet. General instructions   Work with your health care provider to determine what you need help with and what your safety needs are.  Talk with your health care provider about whether it is safe for you to drive.  If you were given a bracelet that identifies you as a person with memory loss or tracks your location, make sure to wear it at all times.  Work with your family to make important decisions, such as advance directives, medical power of attorney, or a living will.  Keep all follow-up visits as told by your health care provider. This is important. Where to find more information  Alzheimer's Association: CapitalMile.co.nz  National Institute on Aging: DVDEnthusiasts.nl  World Health Organization: RoleLink.com.br Contact a  health care provider if:  You have any new or worsening symptoms.  You have problems with choking or swallowing. Get help right away if:  You feel depressed or sad, or feel that you want to harm yourself.  Your family members become concerned for your safety. If you ever feel like you may hurt yourself or others, or have thoughts about taking your own life, get help right away. You can go to your nearest emergency department or call:  Your local emergency services (911 in the [place]  A suicide crisis helpline, such as the [place] at (347)021-3522. This is open 24 hours a day. Summary  Dementia is a condition that affects the way the brain functions. Dementia often affects memory and thinking.  Usually, dementia gets worse with time and cannot be reversed (progressive dementia).  Treatment for this condition depends on the cause of the dementia.  Work with your health care provider to determine what you need help with and what your safety needs are.  Your health care provider can direct you to support groups, organizations, and other health care providers who can help with decisions about your care. This information is not intended to replace advice given to you by your health care provider. Make sure you discuss any questions you have with your health care provider. Document Revised: 01/27/2019 Document Reviewed: 01/27/2019  Elsevier Patient Education  [employer]. Hypertension, Adult Hypertension is another name for high blood pressure. High blood pressure forces your heart to work harder to pump blood. This can cause problems over time. There are two numbers in a blood pressure reading. There is a top number (systolic) over a bottom number (diastolic). It is best to have a blood pressure that is below 120/80. Healthy choices can help lower your blood pressure, or you may need medicine to help lower it. What are the causes? The cause of this  condition is not known. Some conditions may be related to high blood pressure. What increases the risk?  Smoking.  Having type 2 diabetes mellitus, high cholesterol, or both.  Not getting enough exercise or physical activity.  Being overweight.  Having too much fat, sugar, calories, or salt (sodium) in your diet.  Drinking too much alcohol.  Having long-term (chronic) kidney disease.  Having a family history of high blood pressure.  Age. Risk increases with age.  Race. You may be at higher risk if you are African American.  Gender. Men are at higher risk than women before age 58. After age 28, women are at higher risk than men.  Having obstructive sleep apnea.  Stress. What are the signs or symptoms?  High blood pressure may not cause symptoms. Very high blood pressure (hypertensive crisis) may cause: ? Headache. ? Feelings of worry or nervousness (anxiety). ? Shortness of breath. ? Nosebleed. ? A feeling of being sick to your stomach (nausea). ? Throwing up (vomiting). ? Changes in how you see. ? Very bad chest pain. ? Seizures. How is this treated?  This condition is treated by making healthy lifestyle changes, such as: ? Eating healthy foods. ? Exercising more. ? Drinking less alcohol.  Your health care provider may prescribe medicine if lifestyle changes are not enough to get your blood pressure under control, and if: ? Your top number is above 130. ? Your bottom number is above 80.  Your personal target blood pressure may vary. Follow these instructions at home: Eating and drinking   If told, follow the DASH eating plan. To follow this plan: ? Fill one half of your plate at each meal with fruits and vegetables. ? Fill one fourth of your plate at each meal with whole grains. Whole grains include whole-wheat pasta, brown rice, and whole-grain bread. ? Eat or drink low-fat dairy products, such as skim milk or low-fat yogurt. ? Fill one fourth of your  plate at each meal with low-fat (lean) proteins. Low-fat proteins include fish, chicken without skin, eggs, beans, and tofu. ? Avoid fatty meat, cured and processed meat, or chicken with skin. ? Avoid pre-made or processed food.  Eat less than 1,500 mg of salt each day.  Do not drink alcohol if: ? Your doctor tells you not to drink. ? You are pregnant, may be pregnant, or are planning to become pregnant.  If you drink alcohol: ? Limit how much you use to:  0-1 drink a day for women.  0-2 drinks a day for men. ? Be aware of how much alcohol is in your drink. In the [place] one drink equals one 12 oz bottle of beer (355 mL), one 5 oz glass of wine (148 mL), or one 1 oz glass of hard liquor (44 mL). Lifestyle   Work with your doctor to stay at a healthy weight or to lose weight. Ask your doctor what the best weight is for you.  Get at least 30 minutes of exercise most days of the week. This may include walking, swimming, or biking.  Get at least 30 minutes of exercise that strengthens your muscles (resistance exercise) at least 3 days a week. This may include lifting weights or doing Pilates.  Do not use any products that contain nicotine or tobacco, such as cigarettes, e-cigarettes, and chewing tobacco. If you need help quitting, ask your doctor.  Check your blood pressure at home as told by your doctor.  Keep all follow-up visits as told by your doctor. This is important. Medicines  Take over-the-counter and prescription medicines only as told by your doctor. Follow directions carefully.  Do not skip doses of blood pressure medicine. The medicine does not work as well if you skip doses. Skipping doses also puts you at risk for problems.  Ask your doctor about side effects or reactions to medicines that you should watch for. Contact a doctor if you:  Think you are having a reaction to the medicine you are taking.  Have headaches that keep coming back (recurring).  Feel  dizzy.  Have swelling in your ankles.  Have trouble with your vision. Get help right away if you:  Get a very bad headache.  Start to feel mixed up (confused).  Feel weak or numb.  Feel faint.  Have very bad pain in your: ? Chest. ? Belly (abdomen).  Throw up more than once.  Have trouble breathing. Summary  Hypertension is another name for high blood pressure.  High blood pressure forces your heart to work harder to pump blood.  For most people, a normal blood pressure is less than 120/80.  Making healthy choices can help lower blood pressure. If your blood pressure does not get lower with healthy choices, you may need to take medicine. This information is not intended to replace advice given to you by your health care provider. Make sure you discuss any questions you have with your health care provider. Document Revised: 07/23/2018 Document Reviewed: 07/23/2018 Elsevier Patient Education  2020 [employer].  Dementia Dementia is a condition that affects the way the brain functions. It often affects memory and thinking. Usually, dementia gets worse with time and cannot be reversed (progressive dementia). There are many types of dementia, including:  Alzheimer's disease. This type is the most common.  Vascular dementia. This type may happen as the result of a stroke.  Lewy body dementia. This type may happen to people who have Parkinson's disease.  Frontotemporal dementia. This type is caused by damage to nerve cells (neurons) in certain parts of the brain. Some people may be affected by more than one type of dementia. This is called mixed dementia. What are the causes? Dementia is caused by damage to cells in the brain. The area of the brain and the types of cells damaged determine the type of dementia. Usually, this damage is irreversible or cannot be undone. Some examples of irreversible causes include:  Conditions that affect the blood vessels of the brain, such  as diabetes, heart disease, or blood vessel disease.  Genetic mutations. In some cases, changes in the brain may be caused by another condition and can be reversed or slowed. Some examples of reversible causes include:  Injury to the brain.  Certain medicines.  Infection, such as meningitis.  Metabolic problems, such as vitamin B12 deficiency or thyroid disease.  Pressure on the brain, such as from a tumor or blood clot. What are the signs or symptoms? Symptoms  of dementia depend on the type of dementia. Common signs of dementia include problems with remembering, thinking, problem solving, decision making, and communicating. These signs develop slowly or get worse with time. This may include:  Problems remembering things.  Having trouble taking a bath or putting clothes on.  Forgetting appointments.  Forgetting to pay bills.  Difficulty planning and preparing meals.  Having trouble speaking.  Getting lost easily. How is this diagnosed? This condition is diagnosed by a specialist (neurologist). It is diagnosed based on the history of your symptoms, your medical history, a physical exam, and tests. Tests may include:  Tests to evaluate brain function, such as memory tests, cognitive tests, and other tests.  Lab tests, such as blood or urine tests.  Imaging tests, such as a CT scan, a PET scan, or an MRI.  Genetic testing. This may be done if other family members have a diagnosis of certain types of dementia. Your health care provider will talk with you and your family, friends, or caregivers about your history and symptoms. How is this treated?  Treatment for this condition depends on the cause of the dementia. Progressive dementias, such as Alzheimer's disease, cannot be cured, but there may be treatments that help to manage symptoms. Treatment might involve taking medicines that may help to:  Control the dementia.  Slow down the progression of the dementia.  Manage  symptoms. In some cases, treating the cause of your dementia can improve symptoms, reverse symptoms, or slow down how quickly your dementia becomes worse. Your health care provider can direct you to support groups, organizations, and other health care providers who can help with decisions about your care. Follow these instructions at home: Medicines  Take over-the-counter and prescription medicines only as told by your health care provider.  Use a pill organizer or pill reminder to help you manage your medicines.  Avoid taking medicines that can affect thinking, such as pain medicines or sleeping medicines. Lifestyle  Make healthy lifestyle choices. ? Be physically active as told by your health care provider. ? Do not use any products that contain nicotine or tobacco, such as cigarettes, e-cigarettes, and chewing tobacco. If you need help quitting, ask your health care provider. ? Do not drink alcohol. ? Practice stress-management techniques when you get stressed. ? Spend time with other people.  Make sure to get quality sleep. These tips can help you get a good night's rest: ? Avoid napping during the day. ? Keep your sleeping area dark and cool. ? Avoid exercising during the few hours before you go to bed. ? Avoid caffeine products in the evening. Eating and drinking  Drink enough fluid to keep your urine pale yellow.  Eat a healthy diet. General instructions   Work with your health care provider to determine what you need help with and what your safety needs are.  Talk with your health care provider about whether it is safe for you to drive.  If you were given a bracelet that identifies you as a person with memory loss or tracks your location, make sure to wear it at all times.  Work with your family to make important decisions, such as advance directives, medical power of attorney, or a living will.  Keep all follow-up visits as told by your health care provider. This is  important. Where to find more information  Alzheimer's Association: CapitalMile.co.nz  National Institute on Aging: DVDEnthusiasts.nl  World Health Organization: RoleLink.com.br Contact a health care provider if:  [employer]  have any new or worsening symptoms.  You have problems with choking or swallowing. Get help right away if:  You feel depressed or sad, or feel that you want to harm yourself.  Your family members become concerned for your safety. If you ever feel like you may hurt yourself or others, or have thoughts about taking your own life, get help right away. You can go to your nearest emergency department or call:  Your local emergency services (911 in the [place]  A suicide crisis helpline, such as the [place] at (534) 428-1617. This is open 24 hours a day. Summary  Dementia is a condition that affects the way the brain functions. Dementia often affects memory and thinking.  Usually, dementia gets worse with time and cannot be reversed (progressive dementia).  Treatment for this condition depends on the cause of the dementia.  Work with your health care provider to determine what you need help with and what your safety needs are.  Your health care provider can direct you to support groups, organizations, and other health care providers who can help with decisions about your care. This information is not intended to replace advice given to you by your health care provider. Make sure you discuss any questions you have with your health care provider. Document Revised: 01/27/2019 Document Reviewed: 01/27/2019 Elsevier Patient Education  [employer].

## 2020-05-04 ENCOUNTER — Other Ambulatory Visit: Payer: Self-pay

## 2020-05-04 ENCOUNTER — Ambulatory Visit (HOSPITAL_COMMUNITY)
Admission: RE | Admit: 2020-05-04 | Discharge: 2020-05-04 | Disposition: A | Discharge status: Home / Self Care | Payer: Medicare PPO | Source: Ambulatory Visit | Attending: Nephrology | Admitting: Nephrology

## 2020-05-04 DIAGNOSIS — I10 Essential (primary) hypertension: Secondary | ICD-10-CM | POA: Diagnosis not present

## 2020-05-04 LAB — CBC WITH DIFFERENTIAL/PLATELET
Basophils Absolute: 0.1 10*3/uL (ref 0.0–0.2)
Basos: 1 %
EOS (ABSOLUTE): 0.1 10*3/uL (ref 0.0–0.4)
Eos: 1 %
Hematocrit: 28.9 % — ABNORMAL LOW (ref 34.0–46.6)
Hemoglobin: 9 g/dL — ABNORMAL LOW (ref 11.1–15.9)
Immature Grans (Abs): 0 10*3/uL (ref 0.0–0.1)
Immature Granulocytes: 0 %
Lymphocytes Absolute: 1.8 10*3/uL (ref 0.7–3.1)
Lymphs: 23 %
MCH: 24.2 pg — ABNORMAL LOW (ref 26.6–33.0)
MCHC: 31.1 g/dL — ABNORMAL LOW (ref 31.5–35.7)
MCV: 78 fL — ABNORMAL LOW (ref 79–97)
Monocytes Absolute: 0.7 10*3/uL (ref 0.1–0.9)
Monocytes: 10 %
Neutrophils Absolute: 4.9 10*3/uL (ref 1.4–7.0)
Neutrophils: 65 %
Platelets: 476 10*3/uL — ABNORMAL HIGH (ref 150–450)
RBC: 3.72 x10E6/uL — ABNORMAL LOW (ref 3.77–5.28)
RDW: 15.5 % — ABNORMAL HIGH (ref 11.7–15.4)
WBC: 7.6 10*3/uL (ref 3.4–10.8)

## 2020-05-04 LAB — CMP14+EGFR
ALT: 9 IU/L (ref 0–32)
AST: 16 IU/L (ref 0–40)
Albumin/Globulin Ratio: 1.4 (ref 1.2–2.2)
Albumin: 4.3 g/dL (ref 3.7–4.7)
Alkaline Phosphatase: 64 IU/L (ref 48–121)
BUN/Creatinine Ratio: 26 (ref 12–28)
BUN: 36 mg/dL — ABNORMAL HIGH (ref 8–27)
Bilirubin Total: 0.3 mg/dL (ref 0.0–1.2)
CO2: 22 mmol/L (ref 20–29)
Calcium: 10.4 mg/dL — ABNORMAL HIGH (ref 8.7–10.3)
Chloride: 108 mmol/L — ABNORMAL HIGH (ref 96–106)
Creatinine, Ser: 1.4 mg/dL — ABNORMAL HIGH (ref 0.57–1.00)
GFR calc Af Amer: 41 mL/min/{1.73_m2} — ABNORMAL LOW (ref >59)
GFR calc non Af Amer: 36 mL/min/{1.73_m2} — ABNORMAL LOW (ref >59)
Globulin, Total: 3 g/dL (ref 1.5–4.5)
Glucose: 117 mg/dL — ABNORMAL HIGH (ref 65–99)
Potassium: 5.5 mmol/L — ABNORMAL HIGH (ref 3.5–5.2)
Sodium: 144 mmol/L (ref 134–144)
Total Protein: 7.3 g/dL (ref 6.0–8.5)

## 2020-05-04 LAB — FOLATE: Folate: >20 ng/mL (ref >3.0)

## 2020-05-04 LAB — VITAMIN B12: Vitamin B-12: >2000 pg/mL — ABNORMAL HIGH (ref 232–1245)

## 2020-05-04 LAB — TSH: TSH: 1.09 u[IU]/mL (ref 0.450–4.500)

## 2020-05-04 LAB — RPR: RPR Ser Ql: NONREACTIVE

## 2020-05-04 LAB — SEDIMENTATION RATE: Sed Rate: 75 mm/hr — ABNORMAL HIGH (ref 0–40)

## 2020-05-04 NOTE — Progress Notes (Signed)
*  PRELIMINARY RESULTS* Echocardiogram 2D Echocardiogram has been performed.  Wendy [name] 05/04/2020, 1:42 PM

## 2020-05-05 ENCOUNTER — Other Ambulatory Visit: Payer: Self-pay

## 2020-05-05 DIAGNOSIS — D649 Anemia, unspecified: Secondary | ICD-10-CM

## 2020-05-05 DIAGNOSIS — M069 Rheumatoid arthritis, unspecified: Secondary | ICD-10-CM

## 2020-05-05 DIAGNOSIS — I10 Essential (primary) hypertension: Secondary | ICD-10-CM

## 2020-05-05 DIAGNOSIS — R413 Other amnesia: Secondary | ICD-10-CM

## 2020-05-05 DIAGNOSIS — E1165 Type 2 diabetes mellitus with hyperglycemia: Secondary | ICD-10-CM

## 2020-05-06 ENCOUNTER — Other Ambulatory Visit: Payer: Self-pay | Admitting: Family Medicine

## 2020-05-06 MED ORDER — FOLIC ACID 1 MG PO TABS: 1.0000 mg | ORAL_TABLET | Freq: Every day | ORAL | 3 refills | Status: DC

## 2020-05-06 NOTE — Telephone Encounter (Signed)
  Prescription Request  05/06/2020  What is the name of the medication or equipment? Folic Acid 1 mg. Patient is out  Have you contacted your pharmacy to request a refill? (if applicable) NO  Which pharmacy would you like this sent to? [facility]   Patient notified that their request is being sent to the clinical staff for review and that they should receive a response within 2 business days.

## 2020-05-19 DIAGNOSIS — R2689 Other abnormalities of gait and mobility: Secondary | ICD-10-CM | POA: Diagnosis not present

## 2020-06-01 ENCOUNTER — Encounter: Payer: Self-pay | Admitting: Physician Assistant

## 2020-06-01 ENCOUNTER — Ambulatory Visit: Payer: Medicare PPO | Admitting: Physician Assistant

## 2020-06-01 ENCOUNTER — Telehealth: Payer: Self-pay | Admitting: Family Medicine

## 2020-06-01 ENCOUNTER — Other Ambulatory Visit: Payer: Self-pay

## 2020-06-01 VITALS — BP 116/71 | HR 88 | Temp 97.5°F | Resp 20 | Ht 69.0 in | Wt 122.0 lb

## 2020-06-01 DIAGNOSIS — R600 Localized edema: Secondary | ICD-10-CM

## 2020-06-01 NOTE — Telephone Encounter (Signed)
Pt would like and update on the referral to neurology. Please advise.

## 2020-06-01 NOTE — Patient Instructions (Signed)
Edema  Edema is when you have too much fluid in your body or under your skin. Edema may make your legs, feet, and ankles swell up. Swelling is also common in looser tissues, like around your eyes. This is a common condition. It gets more common as you get older. There are many possible causes of edema. Eating too much salt (sodium) and being on your feet or sitting for a long time can cause edema in your legs, feet, and ankles. Hot weather may make edema worse. Edema is usually painless. Your skin may look swollen or shiny. Follow these instructions at home:  Keep the swollen body part raised (elevated) above the level of your heart when you are sitting or lying down.  Do not sit still or stand for a long time.  Do not wear tight clothes. Do not wear garters on your upper legs.  Exercise your legs. This can help the swelling go down.  Wear elastic bandages or support stockings as told by your doctor.  Eat a low-salt (low-sodium) diet to reduce fluid as told by your doctor.  Depending on the cause of your swelling, you may need to limit how much fluid you drink (fluid restriction).  Take over-the-counter and prescription medicines only as told by your doctor. Contact a doctor if:  Treatment is not working.  You have heart, liver, or kidney disease and have symptoms of edema.  You have sudden and unexplained weight gain. Get help right away if:  You have shortness of breath or chest pain.  You cannot breathe when you lie down.  You have pain, redness, or warmth in the swollen areas.  You have heart, liver, or kidney disease and get edema all of a sudden.  You have a fever and your symptoms get worse all of a sudden. Summary  Edema is when you have too much fluid in your body or under your skin.  Edema may make your legs, feet, and ankles swell up. Swelling is also common in looser tissues, like around your eyes.  Raise (elevate) the swollen body part above the level of your  heart when you are sitting or lying down.  Follow your doctor's instructions about diet and how much fluid you can drink (fluid restriction). This information is not intended to replace advice given to you by your health care provider. Make sure you discuss any questions you have with your health care provider. Document Revised: 11/15/2017 Document Reviewed: 11/30/2016 Elsevier Patient Education  2020 Elsevier Inc.  

## 2020-06-01 NOTE — Progress Notes (Signed)
  Subjective:     Patient ID: Wendy [name], female   DOB: [birth year], 79 y.o.   MRN: [id number]  HPI Pt here due to lower ext edema Multiple visits for same -  See notes Referred to [facility] due to decrease in renal function State she is supposed to start Epogen shots this week Supposed to wear compression hose but states these do not help   Review of Systems  Constitutional: Negative.   Respiratory: Negative.   Cardiovascular: Negative.   Musculoskeletal: Positive for joint swelling.       Objective:   Physical Exam Vitals and nursing note reviewed.  Constitutional:      General: She is not in acute distress.    Appearance: Normal appearance. She is not ill-appearing or toxic-appearing.  Cardiovascular:     Rate and Rhythm: Normal rate and regular rhythm.     Pulses: Normal pulses.     Heart sounds: Normal heart sounds.  Pulmonary:     Effort: Pulmonary effort is normal.     Breath sounds: Normal breath sounds.  Musculoskeletal:     Right lower leg: Edema present.     Left lower leg: Edema present.     Comments: Pt not wearing compression hose 1+ pitting edema from mid tibia distal No skin breakdown noted  Neurological:     Mental Status: She is alert.        Assessment:     Lower ext edema    Plan:     Continue with diuretic from specialist Stressed wearing compression hose Keep appt for Epogen injection F/U here prn

## 2020-06-03 NOTE — Telephone Encounter (Signed)
Thanks, Courtney

## 2020-06-03 NOTE — Telephone Encounter (Signed)
Pt seen by Je 6/8.  Is this not sufficient

## 2020-06-03 NOTE — Telephone Encounter (Signed)
I am contacting the Office now  - I did not see her appt with J :)

## 2020-06-06 ENCOUNTER — Encounter (HOSPITAL_COMMUNITY): Payer: Self-pay

## 2020-06-06 ENCOUNTER — Telehealth: Payer: Self-pay | Admitting: Family Medicine

## 2020-06-06 ENCOUNTER — Encounter (HOSPITAL_COMMUNITY)
Admission: RE | Admit: 2020-06-06 | Discharge: 2020-06-06 | Disposition: A | Discharge status: Home / Self Care | Payer: Medicare PPO | Source: Ambulatory Visit | Attending: Nephrology | Admitting: Nephrology

## 2020-06-06 ENCOUNTER — Other Ambulatory Visit: Payer: Self-pay

## 2020-06-06 DIAGNOSIS — D631 Anemia in chronic kidney disease: Secondary | ICD-10-CM | POA: Insufficient documentation

## 2020-06-06 DIAGNOSIS — N1832 Chronic kidney disease, stage 3b: Secondary | ICD-10-CM | POA: Insufficient documentation

## 2020-06-06 HISTORY — DX: Anemia, unspecified: D64.9

## 2020-06-06 LAB — POCT HEMOGLOBIN-HEMACUE: Hemoglobin: 8.8 g/dL — ABNORMAL LOW (ref 12.0–15.0)

## 2020-06-06 MED ORDER — EPOETIN ALFA-EPBX 3000 UNIT/ML IJ SOLN
INTRAMUSCULAR | Status: AC
  Filled 2020-06-06: qty 1

## 2020-06-06 MED ORDER — EPOETIN ALFA-EPBX 3000 UNIT/ML IJ SOLN
3000.0000 [IU] | Freq: Once | INTRAMUSCULAR | Status: AC
  Administered 2020-06-06: 3000 [IU] via SUBCUTANEOUS

## 2020-06-06 NOTE — Telephone Encounter (Signed)
  Prescription Request  06/06/2020  What is the name of the medication or equipment? methotrexate (RHEUMATREX) 2.5 MG tablet  Have you contacted your pharmacy to request a refill? (if applicable) no pt needs new rx because still does not have apt to see rheumatologist  Which pharmacy would you like this sent to? [facility]   Patient notified that their request is being sent to the clinical staff for review and that they should receive a response within 2 business days.

## 2020-06-06 NOTE — Telephone Encounter (Signed)
This was sent in by [facility] in June for 3 m supply.  She is supposed to est care with Rheumatology as this medication is OUTSIDE the scope of primary care.

## 2020-06-07 NOTE — Telephone Encounter (Signed)
She has an appt scheduled but it is not until 08/15/20 with Dr Garen Grams.

## 2020-06-09 ENCOUNTER — Encounter: Payer: Self-pay | Admitting: Neurology

## 2020-06-13 ENCOUNTER — Other Ambulatory Visit: Payer: Self-pay | Admitting: *Deleted

## 2020-06-18 DIAGNOSIS — R2689 Other abnormalities of gait and mobility: Secondary | ICD-10-CM | POA: Diagnosis not present

## 2020-06-20 ENCOUNTER — Encounter (HOSPITAL_COMMUNITY)
Admission: RE | Admit: 2020-06-20 | Discharge: 2020-06-20 | Disposition: A | Discharge status: Home / Self Care | Payer: Medicare PPO | Source: Ambulatory Visit | Attending: Nephrology | Admitting: Nephrology

## 2020-06-20 ENCOUNTER — Other Ambulatory Visit: Payer: Self-pay

## 2020-06-20 ENCOUNTER — Encounter (HOSPITAL_COMMUNITY): Payer: Self-pay

## 2020-06-20 DIAGNOSIS — N1832 Chronic kidney disease, stage 3b: Secondary | ICD-10-CM | POA: Diagnosis not present

## 2020-06-20 LAB — POCT HEMOGLOBIN-HEMACUE: Hemoglobin: 10 g/dL — ABNORMAL LOW (ref 12.0–15.0)

## 2020-06-20 MED ORDER — EPOETIN ALFA-EPBX 3000 UNIT/ML IJ SOLN: 3000.0000 [IU] | Freq: Once | INTRAMUSCULAR | Status: DC

## 2020-06-24 ENCOUNTER — Other Ambulatory Visit: Payer: Self-pay | Admitting: Family Medicine

## 2020-06-24 ENCOUNTER — Other Ambulatory Visit: Payer: Self-pay | Admitting: *Deleted

## 2020-06-24 MED ORDER — METHOTREXATE 2.5 MG PO TABS: 2.5000 mg | ORAL_TABLET | ORAL | 0 refills | Status: DC

## 2020-07-04 ENCOUNTER — Encounter (HOSPITAL_COMMUNITY)
Admission: RE | Admit: 2020-07-04 | Discharge: 2020-07-04 | Disposition: A | Discharge status: Home / Self Care | Payer: Medicare PPO | Source: Ambulatory Visit | Attending: Nephrology | Admitting: Nephrology

## 2020-07-04 ENCOUNTER — Other Ambulatory Visit: Payer: Self-pay

## 2020-07-04 DIAGNOSIS — N1832 Chronic kidney disease, stage 3b: Secondary | ICD-10-CM | POA: Diagnosis present

## 2020-07-04 DIAGNOSIS — D631 Anemia in chronic kidney disease: Secondary | ICD-10-CM | POA: Diagnosis not present

## 2020-07-04 LAB — POCT HEMOGLOBIN-HEMACUE: Hemoglobin: 9.7 g/dL — ABNORMAL LOW (ref 12.0–15.0)

## 2020-07-04 MED ORDER — EPOETIN ALFA-EPBX 3000 UNIT/ML IJ SOLN
3000.0000 [IU] | Freq: Once | INTRAMUSCULAR | Status: AC
  Administered 2020-07-04: 3000 [IU] via SUBCUTANEOUS

## 2020-07-04 MED ORDER — EPOETIN ALFA-EPBX 3000 UNIT/ML IJ SOLN
INTRAMUSCULAR | Status: AC
  Filled 2020-07-04: qty 1

## 2020-07-11 ENCOUNTER — Ambulatory Visit: Payer: Medicare PPO | Admitting: Nurse Practitioner

## 2020-07-11 ENCOUNTER — Encounter: Payer: Self-pay | Admitting: Nurse Practitioner

## 2020-07-11 ENCOUNTER — Other Ambulatory Visit: Payer: Self-pay

## 2020-07-11 VITALS — BP 166/78 | HR 69 | Temp 97.2°F | Resp 18 | Ht 69.0 in | Wt 120.2 lb

## 2020-07-11 DIAGNOSIS — H6123 Impacted cerumen, bilateral: Secondary | ICD-10-CM

## 2020-07-11 DIAGNOSIS — R600 Localized edema: Secondary | ICD-10-CM | POA: Diagnosis not present

## 2020-07-11 DIAGNOSIS — I10 Essential (primary) hypertension: Secondary | ICD-10-CM | POA: Diagnosis not present

## 2020-07-11 MED ORDER — HYDROCHLOROTHIAZIDE 12.5 MG PO TABS: 12.5000 mg | ORAL_TABLET | Freq: Every day | ORAL | 0 refills | Status: DC

## 2020-07-11 MED ORDER — DEBROX 6.5 % OT SOLN: 5.0000 [drp] | Freq: Two times a day (BID) | OTIC | 0 refills | Status: DC

## 2020-07-11 NOTE — Assessment & Plan Note (Signed)
Patient is reporting pain in the right ear, started in the last 1 week.  Pain radiates to her jaw.  Patient reports pain is intermittent and mild.  Patient denies using any sharp objects or Q-tip to remove wax.  Patient is not reporting any fever chills or headache, numbness or tingling.  Provided education to patient.  On assessment right ear is full with cerumen.  Advised patient to use Debrox and follow-up with unresolved or worsening symptoms.  Rx sent to pharmacy.

## 2020-07-11 NOTE — Progress Notes (Signed)
Established Patient Office Visit  Subjective:  Patient ID: Wendy [name], female    DOB: [birth year]  Age: 79 y.o. MRN: [id number]  CC:  Chief Complaint  Patient presents with   Follow-up   Leg Swelling    Bilateral   Jaw Pain    Right, Roaring in ears    HPI Wendy [name] presents for follow up of hypertension. Patient was diagnosed in 08/17/2008 The patient is tolerating the medication well without side effects. Compliance with treatment has been good; including taking medication as directed , maintains a healthy diet  and following up as directed.  Current medication Catapres 0.3 mg every 24 hour.  Blood pressures elevated today but patient denies elevated blood pressures at home.  Patient does not check blood pressures at home.   Concerning bilateral lower extremity edema.  Bilateral lower extremity edema +3.  This is recurrent for patient. Patient reports that elevating feet and compression socks has not helped.  Patient is reporting tenderness bilateral ankles.  Patient reports right ear pain which started in the last week radiating down her jaw.  Patient reports pain is intermittent and mild.  Patient denies using any sharp object or Q-tip to remove wax.  Patient is not reporting any fever, chills or headache.  Past Medical History:  Diagnosis Date   Acquired hammer toes of both feet    Anemia    Anxiety    Arthritis    Diabetes mellitus without complication (HCC)    Eczema    Environmental allergies    Fibrocystic breast changes    GERD (gastroesophageal reflux disease)    Hearing loss    Hypercholesteremia    Hypertension    IBS (irritable bowel syndrome)    Incontinence    Memory loss    Osteoarthritis    PVD (peripheral vascular disease) (HCC)    Rheumatoid arthritis ([place])     Past Surgical History:  Procedure Laterality Date   ABDOMINAL HYSTERECTOMY     SHOULDER SURGERY     FATTY TUMOR REMOVED    Family History  Problem Relation  Age of Onset   Diabetes Mother    Hypertension Mother    Alzheimer's disease Mother    Heart disease Father    Hypertension Sister    Diabetes Sister    Heart disease Sister    Hypertension Brother    Diabetes Brother    Cancer Sister        lung   Heart disease Sister     Social History   Socioeconomic History   Marital status: Divorced    Spouse name: Not on file   Number of children: Not on file   Years of education: Not on file   Highest education level: Not on file  Occupational History   Not on file  Tobacco Use   Smoking status: Never Smoker   Smokeless tobacco: Never Used  Scientific laboratory technician Use: Never used  Substance and Sexual Activity   Alcohol use: No   Drug use: No   Sexual activity: Not on file  Other Topics Concern   Not on file  Social History Narrative   Never Smoked   No Alcohol   No Recreational Drug Use   Retired from Surveyor, minerals work   Marital Status: Divorced since 2003   Children: 5 kids, 12 grandchildren, 53 great grandchildren   Religion: Psychologist, occupational   Social Determinants of Radio broadcast assistant Strain:  Difficulty of Paying Living Expenses:   Food Insecurity:    Worried About Charity fundraiser in the Last Year:    Arboriculturist in the Last Year:   Transportation Needs:    Film/video editor (Medical):    Lack of Transportation (Non-Medical):   Physical Activity:    Days of Exercise per Week:    Minutes of Exercise per Session:   Stress:    Feeling of Stress :   Social Connections:    Frequency of Communication with Friends and Family:    Frequency of Social Gatherings with Friends and Family:    Attends Religious Services:    Active Member of Clubs or Organizations:    Attends Music therapist:    Marital Status:   Intimate Partner Violence:    Fear of Current or Ex-Partner:    Emotionally Abused:    Physically Abused:    Sexually Abused:      Outpatient Medications Prior to Visit  Medication Sig Dispense Refill   aspirin EC 81 MG tablet Take 81 mg by mouth daily.     Cholecalciferol 25 MCG (1000 UT) tablet Take 1 tablet by mouth daily.     cilostazol (PLETAL) 100 MG tablet Take 1 tablet (100 mg total) by mouth daily. 90 tablet 3   cloNIDine (CATAPRES - DOSED IN MG/24 HR) 0.3 mg/24hr patch Place 1 patch (0.3 mg total) onto the skin once a week. 4 patch 0   cycloSPORINE (RESTASIS) 0.05 % ophthalmic emulsion Place 1 drop into both eyes 2 (two) times daily. 0.4 mL 5   diclofenac Sodium (VOLTAREN) 1 % GEL Apply 2 g topically daily.     epoetin alfa-epbx (RETACRIT) 3000 UNIT/ML injection 3,000 Units every 14 (fourteen) days.     folic acid (FOLVITE) 1 MG tablet Take 1 tablet (1 mg total) by mouth daily. 90 tablet 3   irbesartan (AVAPRO) 300 MG tablet Take 1 tablet (300 mg total) by mouth every morning. 90 tablet 3   memantine (NAMENDA) 10 MG tablet Take 1 tablet (10 mg total) by mouth in the morning and at bedtime. 90 tablet 3   metFORMIN (GLUCOPHAGE) 1000 MG tablet Take 0.5 tablets (500 mg total) by mouth 2 (two) times daily with a meal. Renal dose 180 tablet 1   methotrexate (RHEUMATREX) 2.5 MG tablet Take 1 tablet (2.5 mg total) by mouth once a week. 12 tablet 0   nortriptyline (PAMELOR) 10 MG capsule Take 10 mg by mouth at bedtime. 1-2 tablets nightly     rivastigmine (EXELON) 9.5 mg/24hr Place 1 patch (9.5 mg total) onto the skin daily. 12 patch 3   sitaGLIPtin (JANUVIA) 50 MG tablet Take 1 tablet (50 mg total) by mouth daily. 90 tablet 3   No facility-administered medications prior to visit.    Allergies  Allergen Reactions   Codeine    Erythromycin    Leflunomide Other (See Comments)   Lisinopril    Naproxen    Penicillins    Pioglitazone    Valsartan Other (See Comments)    ROS Review of Systems  Constitutional: Negative.   HENT: Positive for ear pain.   Eyes: Negative.    Cardiovascular: Positive for leg swelling.  Gastrointestinal: Negative.   Genitourinary: Negative.   Musculoskeletal: Positive for joint swelling.  Neurological: Negative.   Psychiatric/Behavioral: The patient is not nervous/anxious.       Objective:    Physical Exam Vitals reviewed.  HENT:  Head: Normocephalic.     Right Ear: There is impacted cerumen.     Nose: Nose normal.  Cardiovascular:     Rate and Rhythm: Normal rate.     Pulses: Normal pulses.     Heart sounds: Normal heart sounds.  Pulmonary:     Effort: Pulmonary effort is normal.     Breath sounds: Normal breath sounds.  Abdominal:     General: Bowel sounds are normal.  Musculoskeletal:        General: Swelling and tenderness present.  Neurological:     Mental Status: She is alert and oriented to person, place, and time.     BP (!) 166/78    Pulse 69    Temp (!) 97.2 F (36.2 C) (Temporal)    Resp 18    Ht 5\' 9"  (1.753 m)    Wt 120 lb 3.2 oz (54.5 kg)    SpO2 100%    BMI 17.75 kg/m  Wt Readings from Last 3 Encounters:  07/11/20 120 lb 3.2 oz (54.5 kg)  06/06/20 122 lb (55.3 kg)  06/01/20 122 lb (55.3 kg)     Health Maintenance Due  Topic Date Due   FOOT EXAM  Never done   OPHTHALMOLOGY EXAM  Never done   COVID-19 Vaccine (1) Never done   DEXA SCAN  Never done   INFLUENZA VACCINE  06/26/2020    There are no preventive care reminders to display for this patient.  Lab Results  Component Value Date   TSH 1.090 05/03/2020   Lab Results  Component Value Date   WBC 7.6 05/03/2020   HGB 9.7 (L) 07/04/2020   HCT 28.9 (L) 05/03/2020   MCV 78 (L) 05/03/2020   PLT 476 (H) 05/03/2020   Lab Results  Component Value Date   NA 144 05/03/2020   K 5.5 (H) 05/03/2020   CO2 22 05/03/2020   GLUCOSE 117 (H) 05/03/2020   BUN 36 (H) 05/03/2020   CREATININE 1.40 (H) 05/03/2020   BILITOT 0.3 05/03/2020   ALKPHOS 64 05/03/2020   AST 16 05/03/2020   ALT 9 05/03/2020   PROT 7.3 05/03/2020    ALBUMIN 4.3 05/03/2020   CALCIUM 10.4 (H) 05/03/2020   Lab Results  Component Value Date   CHOL 173 02/11/2020   Lab Results  Component Value Date   HDL 63 02/11/2020   Lab Results  Component Value Date   LDLCALC 96 02/11/2020   Lab Results  Component Value Date   TRIG 72 02/11/2020   Lab Results  Component Value Date   CHOLHDL 2.7 02/11/2020   Lab Results  Component Value Date   HGBA1C 7.4 (H) 02/11/2020      Assessment & Plan:   Problem List Items Addressed This Visit      Cardiovascular and Mediastinum   Essential hypertension - Primary    Patient is a 79 year old female who presents for follow-up hypertension.  Patient was diagnosed in 08/17/2008.  Patient is tolerating the medication well without side effects.  Compliance with treatment has been good including taking medication as directed, maintains a healthy diet and follow-up as directed.  Current medication is Catapres 0.3 mg every 24 hour patch.  Blood pressure elevated today patient denies elevated blood pressure at home.  Patient does not check blood pressure at home. Provided education to patient with printed handout on the importance of checking blood pressures at home. Started patient on hydrochlorothiazide to help reduce blood pressure and reduce bilateral lower extremity edema.  Follow-up in 2 weeks.      Relevant Medications   hydrochlorothiazide (HYDRODIURIL) 12.5 MG tablet     Nervous and Auditory   Cerumen debris on tympanic membrane of both ears    Patient is reporting pain in the right ear, started in the last 1 week.  Pain radiates to her jaw.  Patient reports pain is intermittent and mild.  Patient denies using any sharp objects or Q-tip to remove wax.  Patient is not reporting any fever chills or headache, numbness or tingling.  Provided education to patient.  On assessment right ear is full with cerumen.  Advised patient to use Debrox and follow-up with unresolved or worsening symptoms.  Rx  sent to pharmacy.        Other   Localized edema    Patient is a 79 year old female who presents to clinic with bilateral lower extremity edema.  Patient reports that edema is recurrent and is gradually worse.  Patient reports elevating feet and compression socks has not helped.  Patient is reporting tenderness bilateral ankle. Educated patient to continue to elevate feet, continue compression socks, Started patient on hydrochlorothiazide 12.25 mg daily to help with elevated blood pressure and edema.  Printed handout provided to patient. Patient will monitor blood pressure and follow-up in 2 weeks.      Relevant Medications   hydrochlorothiazide (HYDRODIURIL) 12.5 MG tablet      Meds ordered this encounter  Medications   hydrochlorothiazide (HYDRODIURIL) 12.5 MG tablet    Sig: Take 1 tablet (12.5 mg total) by mouth daily.    Dispense:  30 tablet    Refill:  0    Order Specific Question:   Supervising Provider    Answer:   Caryl Pina A [[id number]]   carbamide peroxide (DEBROX) 6.5 % OTIC solution    Sig: Place 5 drops into both ears 2 (two) times daily.    Dispense:  15 mL    Refill:  0    Order Specific Question:   Supervising Provider    Answer:   Caryl Pina A [[id number]]    Follow-up: Return in about 2 weeks (around 07/25/2020).    Ivy Lynn, NP

## 2020-07-11 NOTE — Assessment & Plan Note (Signed)
Patient is a 79 year old female who presents to clinic with bilateral lower extremity edema.  Patient reports that edema is recurrent and is gradually worse.  Patient reports elevating feet and compression socks has not helped.  Patient is reporting tenderness bilateral ankle. Educated patient to continue to elevate feet, continue compression socks, Started patient on hydrochlorothiazide 12.25 mg daily to help with elevated blood pressure and edema.  Printed handout provided to patient. Patient will monitor blood pressure and follow-up in 2 weeks.

## 2020-07-11 NOTE — Patient Instructions (Signed)
Edema  Edema is when you have too much fluid in your body or under your skin. Edema may make your legs, feet, and ankles swell up. Swelling is also common in looser tissues, like around your eyes. This is a common condition. It gets more common as you get older. There are many possible causes of edema. Eating too much salt (sodium) and being on your feet or sitting for a long time can cause edema in your legs, feet, and ankles. Hot weather may make edema worse. Edema is usually painless. Your skin may look swollen or shiny. Follow these instructions at home:  Keep the swollen body part raised (elevated) above the level of your heart when you are sitting or lying down.  Do not sit still or stand for a long time.  Do not wear tight clothes. Do not wear garters on your upper legs.  Exercise your legs. This can help the swelling go down.  Wear elastic bandages or support stockings as told by your doctor.  Eat a low-salt (low-sodium) diet to reduce fluid as told by your doctor.  Depending on the cause of your swelling, you may need to limit how much fluid you drink (fluid restriction).  Take over-the-counter and prescription medicines only as told by your doctor. Contact a doctor if:  Treatment is not working.  You have heart, liver, or kidney disease and have symptoms of edema.  You have sudden and unexplained weight gain. Get help right away if:  You have shortness of breath or chest pain.  You cannot breathe when you lie down.  You have pain, redness, or warmth in the swollen areas.  You have heart, liver, or kidney disease and get edema all of a sudden.  You have a fever and your symptoms get worse all of a sudden. Summary  Edema is when you have too much fluid in your body or under your skin.  Edema may make your legs, feet, and ankles swell up. Swelling is also common in looser tissues, like around your eyes.  Raise (elevate) the swollen body part above the level of your  heart when you are sitting or lying down.  Follow your doctor's instructions about diet and how much fluid you can drink (fluid restriction). This information is not intended to replace advice given to you by your health care provider. Make sure you discuss any questions you have with your health care provider. Document Revised: 11/15/2017 Document Reviewed: 11/30/2016 Elsevier Patient Education  2020 Elsevier Inc. Hypertension, Adult Hypertension is another name for high blood pressure. High blood pressure forces your heart to work harder to pump blood. This can cause problems over time. There are two numbers in a blood pressure reading. There is a top number (systolic) over a bottom number (diastolic). It is best to have a blood pressure that is below 120/80. Healthy choices can help lower your blood pressure, or you may need medicine to help lower it. What are the causes? The cause of this condition is not known. Some conditions may be related to high blood pressure. What increases the risk?  Smoking.  Having type 2 diabetes mellitus, high cholesterol, or both.  Not getting enough exercise or physical activity.  Being overweight.  Having too much fat, sugar, calories, or salt (sodium) in your diet.  Drinking too much alcohol.  Having long-term (chronic) kidney disease.  Having a family history of high blood pressure.  Age. Risk increases with age.  Race. You may be at   higher risk if you are African American.  Gender. Men are at higher risk than women before age 45. After age 65, women are at higher risk than men.  Having obstructive sleep apnea.  Stress. What are the signs or symptoms?  High blood pressure may not cause symptoms. Very high blood pressure (hypertensive crisis) may cause: ? Headache. ? Feelings of worry or nervousness (anxiety). ? Shortness of breath. ? Nosebleed. ? A feeling of being sick to your stomach (nausea). ? Throwing up (vomiting). ? Changes  in how you see. ? Very bad chest pain. ? Seizures. How is this treated?  This condition is treated by making healthy lifestyle changes, such as: ? Eating healthy foods. ? Exercising more. ? Drinking less alcohol.  Your health care provider may prescribe medicine if lifestyle changes are not enough to get your blood pressure under control, and if: ? Your top number is above 130. ? Your bottom number is above 80.  Your personal target blood pressure may vary. Follow these instructions at home: Eating and drinking   If told, follow the DASH eating plan. To follow this plan: ? Fill one half of your plate at each meal with fruits and vegetables. ? Fill one fourth of your plate at each meal with whole grains. Whole grains include whole-wheat pasta, brown rice, and whole-grain bread. ? Eat or drink low-fat dairy products, such as skim milk or low-fat yogurt. ? Fill one fourth of your plate at each meal with low-fat (lean) proteins. Low-fat proteins include fish, chicken without skin, eggs, beans, and tofu. ? Avoid fatty meat, cured and processed meat, or chicken with skin. ? Avoid pre-made or processed food.  Eat less than 1,500 mg of salt each day.  Do not drink alcohol if: ? Your doctor tells you not to drink. ? You are pregnant, may be pregnant, or are planning to become pregnant.  If you drink alcohol: ? Limit how much you use to:  0-1 drink a day for women.  0-2 drinks a day for men. ? Be aware of how much alcohol is in your drink. In the [place] one drink equals one 12 oz bottle of beer (355 mL), one 5 oz glass of wine (148 mL), or one 1 oz glass of hard liquor (44 mL). Lifestyle   Work with your doctor to stay at a healthy weight or to lose weight. Ask your doctor what the best weight is for you.  Get at least 30 minutes of exercise most days of the week. This may include walking, swimming, or biking.  Get at least 30 minutes of exercise that strengthens your muscles  (resistance exercise) at least 3 days a week. This may include lifting weights or doing Pilates.  Do not use any products that contain nicotine or tobacco, such as cigarettes, e-cigarettes, and chewing tobacco. If you need help quitting, ask your doctor.  Check your blood pressure at home as told by your doctor.  Keep all follow-up visits as told by your doctor. This is important. Medicines  Take over-the-counter and prescription medicines only as told by your doctor. Follow directions carefully.  Do not skip doses of blood pressure medicine. The medicine does not work as well if you skip doses. Skipping doses also puts you at risk for problems.  Ask your doctor about side effects or reactions to medicines that you should watch for. Contact a doctor if you:  Think you are having a reaction to the medicine you are taking.    Have headaches that keep coming back (recurring).  Feel dizzy.  Have swelling in your ankles.  Have trouble with your vision. Get help right away if you:  Get a very bad headache.  Start to feel mixed up (confused).  Feel weak or numb.  Feel faint.  Have very bad pain in your: ? Chest. ? Belly (abdomen).  Throw up more than once.  Have trouble breathing. Summary  Hypertension is another name for high blood pressure.  High blood pressure forces your heart to work harder to pump blood.  For most people, a normal blood pressure is less than 120/80.  Making healthy choices can help lower blood pressure. If your blood pressure does not get lower with healthy choices, you may need to take medicine. This information is not intended to replace advice given to you by your health care provider. Make sure you discuss any questions you have with your health care provider. Document Revised: 07/23/2018 Document Reviewed: 07/23/2018 Elsevier Patient Education  2020 Elsevier Inc.  

## 2020-07-11 NOTE — Assessment & Plan Note (Signed)
Patient is a 79 year old female who presents for follow-up hypertension.  Patient was diagnosed in 08/17/2008.  Patient is tolerating the medication well without side effects.  Compliance with treatment has been good including taking medication as directed, maintains a healthy diet and follow-up as directed.  Current medication is Catapres 0.3 mg every 24 hour patch.  Blood pressure elevated today patient denies elevated blood pressure at home.  Patient does not check blood pressure at home. Provided education to patient with printed handout on the importance of checking blood pressures at home. Started patient on hydrochlorothiazide to help reduce blood pressure and reduce bilateral lower extremity edema. Follow-up in 2 weeks.

## 2020-07-14 ENCOUNTER — Telehealth: Payer: Self-pay

## 2020-07-14 NOTE — Telephone Encounter (Signed)
Pt had called this am c/o elevated BP. It was 172/101. She has been prescribed a new blood pressure medication and she had just picked it up late yesterday evening. The triage nurse this morning instructed pt to go ahead and take the medication and call us back with a new reading. Pt returned call at about 12:15pm and the reading is down to 153/91. She denies HA, blurred vision, SOB, chest pain. Instructed pt to lay down in a quiet dark room and it should come down even more. She will call back in about 3 hours with an update.

## 2020-07-18 ENCOUNTER — Encounter (HOSPITAL_COMMUNITY): Payer: Self-pay

## 2020-07-18 ENCOUNTER — Other Ambulatory Visit: Payer: Self-pay

## 2020-07-18 ENCOUNTER — Encounter (HOSPITAL_COMMUNITY)
Admission: RE | Admit: 2020-07-18 | Discharge: 2020-07-18 | Disposition: A | Discharge status: Home / Self Care | Payer: Medicare PPO | Source: Ambulatory Visit | Attending: Nephrology | Admitting: Nephrology

## 2020-07-18 DIAGNOSIS — N1832 Chronic kidney disease, stage 3b: Secondary | ICD-10-CM | POA: Diagnosis not present

## 2020-07-18 LAB — POCT HEMOGLOBIN-HEMACUE: Hemoglobin: 10.5 g/dL — ABNORMAL LOW (ref 12.0–15.0)

## 2020-07-18 MED ORDER — EPOETIN ALFA-EPBX 3000 UNIT/ML IJ SOLN: 3000.0000 [IU] | Freq: Once | INTRAMUSCULAR | Status: DC

## 2020-07-19 DIAGNOSIS — R2689 Other abnormalities of gait and mobility: Secondary | ICD-10-CM | POA: Diagnosis not present

## 2020-07-22 ENCOUNTER — Telehealth: Payer: Self-pay | Admitting: Family Medicine

## 2020-07-22 NOTE — Telephone Encounter (Signed)
Patient states has some sore gums thinks is yeast. Asked what she could do until appt. Advised probiotic and yogurt. Appt Monday

## 2020-07-24 ENCOUNTER — Other Ambulatory Visit: Payer: Self-pay | Admitting: Family Medicine

## 2020-07-25 ENCOUNTER — Ambulatory Visit: Payer: Medicare PPO | Admitting: Family Medicine

## 2020-07-25 ENCOUNTER — Ambulatory Visit: Payer: Medicare PPO | Admitting: Nurse Practitioner

## 2020-07-26 ENCOUNTER — Encounter: Payer: Self-pay | Admitting: Family Medicine

## 2020-07-26 ENCOUNTER — Other Ambulatory Visit: Payer: Self-pay

## 2020-07-26 ENCOUNTER — Ambulatory Visit: Payer: Medicare PPO | Admitting: Family Medicine

## 2020-07-26 VITALS — BP 138/85 | HR 87 | Temp 97.3°F | Ht 69.0 in | Wt 118.6 lb

## 2020-07-26 DIAGNOSIS — G4719 Other hypersomnia: Secondary | ICD-10-CM | POA: Diagnosis not present

## 2020-07-26 DIAGNOSIS — E1122 Type 2 diabetes mellitus with diabetic chronic kidney disease: Secondary | ICD-10-CM | POA: Diagnosis not present

## 2020-07-26 DIAGNOSIS — N183 Chronic kidney disease, stage 3 unspecified: Secondary | ICD-10-CM | POA: Diagnosis not present

## 2020-07-26 LAB — BAYER DCA HB A1C WAIVED: HB A1C (BAYER DCA - WAIVED): 7.1 % — ABNORMAL HIGH (ref <7.0)

## 2020-07-26 NOTE — Progress Notes (Signed)
Subjective: CC: Follow-up edema, hypertension PCP: Janora Norlander, DO XBD:[id number] Wendy [name] is a 79 y.o. female presenting to clinic today for:  1.  Edema/hypertension Patient was seen about 2 weeks ago for bilateral lower extremity edema.  She was started on hydrochlorothiazide 12.5 mg daily.  She is tolerating this medication without difficulty.  She has good urine output.  Does not report any cramping in the lower extremities.  She continues to have some lower extremity edema but often sits with her legs hung down during the day.  She rarely elevates the lower extremities.  No chest pain, shortness of breath.  Does not report any claudication or discoloration of the legs.  She uses Pletal daily.   ROS: Per HPI  Allergies  Allergen Reactions  . Codeine   . Erythromycin   . Leflunomide Other (See Comments)  . Lisinopril   . Naproxen   . Penicillins   . Pioglitazone   . Valsartan Other (See Comments)   Past Medical History:  Diagnosis Date  . Acquired hammer toes of both feet   . Anemia   . Anxiety   . Arthritis   . Diabetes mellitus without complication ([place])   . Eczema   . Environmental allergies   . Fibrocystic breast changes   . GERD (gastroesophageal reflux disease)   . Hearing loss   . Hypercholesteremia   . Hypertension   . IBS (irritable bowel syndrome)   . Incontinence   . Memory loss   . Osteoarthritis   . PVD (peripheral vascular disease) ([place])   . Rheumatoid arthritis (HCC)     Current Outpatient Medications:  .  aspirin EC 81 MG tablet, Take 81 mg by mouth daily., Disp: , Rfl:  .  carbamide peroxide (DEBROX) 6.5 % OTIC solution, Place 5 drops into both ears 2 (two) times daily., Disp: 15 mL, Rfl: 0 .  Cholecalciferol 25 MCG (1000 UT) tablet, Take 1 tablet by mouth daily., Disp: , Rfl:  .  cilostazol (PLETAL) 100 MG tablet, Take 1 tablet (100 mg total) by mouth daily., Disp: 90 tablet, Rfl: 3 .  cloNIDine (CATAPRES - DOSED IN MG/24 HR) 0.3 mg/24hr  patch, Place 1 patch (0.3 mg total) onto the skin once a week., Disp: 4 patch, Rfl: 0 .  cycloSPORINE (RESTASIS) 0.05 % ophthalmic emulsion, Place 1 drop into both eyes 2 (two) times daily., Disp: 0.4 mL, Rfl: 5 .  diclofenac Sodium (VOLTAREN) 1 % GEL, Apply 2 g topically daily., Disp: , Rfl:  .  epoetin alfa-epbx (RETACRIT) 3000 UNIT/ML injection, 3,000 Units every 14 (fourteen) days., Disp: , Rfl:  .  folic acid (FOLVITE) 1 MG tablet, Take 1 tablet (1 mg total) by mouth daily., Disp: 90 tablet, Rfl: 3 .  hydrochlorothiazide (HYDRODIURIL) 12.5 MG tablet, Take 1 tablet (12.5 mg total) by mouth daily., Disp: 30 tablet, Rfl: 0 .  irbesartan (AVAPRO) 300 MG tablet, Take 1 tablet (300 mg total) by mouth every morning., Disp: 90 tablet, Rfl: 3 .  memantine (NAMENDA) 10 MG tablet, Take 1 tablet (10 mg total) by mouth in the morning and at bedtime., Disp: 90 tablet, Rfl: 3 .  metFORMIN (GLUCOPHAGE) 1000 MG tablet, Take 0.5 tablets (500 mg total) by mouth 2 (two) times daily with a meal. Renal dose, Disp: 180 tablet, Rfl: 1 .  methotrexate (RHEUMATREX) 2.5 MG tablet, Take 1 tablet (2.5 mg total) by mouth once a week., Disp: 12 tablet, Rfl: 0 .  nortriptyline (PAMELOR) 10 MG capsule,  Take 10 mg by mouth at bedtime. 1-2 tablets nightly, Disp: , Rfl:  .  rivastigmine (EXELON) 9.5 mg/24hr, APPLY 1 PATCH TOPICALLY ONCE DAILY, Disp: 30 patch, Rfl: 2 .  sitaGLIPtin (JANUVIA) 50 MG tablet, Take 1 tablet (50 mg total) by mouth daily., Disp: 90 tablet, Rfl: 3 Social History   Socioeconomic History  . Marital status: Divorced    Spouse name: Not on file  . Number of children: Not on file  . Years of education: Not on file  . Highest education level: Not on file  Occupational History  . Not on file  Tobacco Use  . Smoking status: Never Smoker  . Smokeless tobacco: Never Used  Vaping Use  . Vaping Use: Never used  Substance and Sexual Activity  . Alcohol use: No  . Drug use: No  . Sexual activity: Not on  file  Other Topics Concern  . Not on file  Social History Narrative   Never Smoked   No Alcohol   No Recreational Drug Use   Retired from Surveyor, minerals work   Marital Status: Divorced since 2003   Children: 5 kids, 12 grandchildren, 83 great grandchildren   Religion: Psychologist, occupational   Social Determinants of Radio broadcast assistant Strain:   . Difficulty of Paying Living Expenses: Not on file  Food Insecurity:   . Worried About Charity fundraiser in the Last Year: Not on file  . Ran Out of Food in the Last Year: Not on file  Transportation Needs:   . Lack of Transportation (Medical): Not on file  . Lack of Transportation (Non-Medical): Not on file  Physical Activity:   . Days of Exercise per Week: Not on file  . Minutes of Exercise per Session: Not on file  Stress:   . Feeling of Stress : Not on file  Social Connections:   . Frequency of Communication with Friends and Family: Not on file  . Frequency of Social Gatherings with Friends and Family: Not on file  . Attends Religious Services: Not on file  . Active Member of Clubs or Organizations: Not on file  . Attends Archivist Meetings: Not on file  . Marital Status: Not on file  Intimate Partner Violence:   . Fear of Current or Ex-Partner: Not on file  . Emotionally Abused: Not on file  . Physically Abused: Not on file  . Sexually Abused: Not on file   Family History  Problem Relation Age of Onset  . Diabetes Mother   . Hypertension Mother   . Alzheimer's disease Mother   . Heart disease Father   . Hypertension Sister   . Diabetes Sister   . Heart disease Sister   . Hypertension Brother   . Diabetes Brother   . Cancer Sister        lung  . Heart disease Sister     Objective: Office vital signs reviewed. BP 138/85   Pulse 87   Temp (!) 97.3 F (36.3 C)   Ht 5\' 9"  (1.753 m)   Wt 118 lb 9.6 oz (53.8 kg)   SpO2 98%   BMI 17.51 kg/m   Physical Examination:  General: Awake, alert, well  appearing elderly female, No acute distress HEENT: Normal; sclera white.  Moist mucous membranes Cardio: regular rate and rhythm, S1S2 heard, no murmurs appreciated Pulm: clear to auscultation bilaterally, no wheezes, rhonchi or rales; normal work of breathing on room air Extremities: warm, well perfused, 1+ edema of  legs, No cyanosis or clubbing; +1 pulses bilaterally Neuro: Seems appropriate.  Assessment/ Plan: 79 y.o. female   1. CKD stage 3 due to type 2 diabetes mellitus (HCC) Check renal function panel, especially given recent initiation of HCTZ 12.5 mg daily.  Blood pressure under excellent control.  A1c also checked which showed good control for age at 7.1.  No changes made - Renal Function Panel - Bayer DCA Hb A1c Waived  2. Excessive daytime sleepiness Wean from nortriptyline given age, excessive daytime sleepiness.  Start with 1 tab q. OD for 2 weeks then can discontinue    No orders of the defined types were placed in this encounter.  No orders of the defined types were placed in this encounter.    Janora Norlander, DO [place] [phone]

## 2020-07-26 NOTE — Patient Instructions (Signed)
You have refills on your pletal at the pharmacy.  Give them a call for refill.  The patch was sent yesterday.  Nortriptyline can cause excessive daytime sleepiness.  You can start taking one EVERY OTHER day for 2 weeks. Then off.  This should help your sleepiness.

## 2020-07-27 LAB — RENAL FUNCTION PANEL
Albumin: 4.3 g/dL (ref 3.7–4.7)
BUN/Creatinine Ratio: 26 (ref 12–28)
BUN: 37 mg/dL — ABNORMAL HIGH (ref 8–27)
CO2: 25 mmol/L (ref 20–29)
Calcium: 10.4 mg/dL — ABNORMAL HIGH (ref 8.7–10.3)
Chloride: 101 mmol/L (ref 96–106)
Creatinine, Ser: 1.41 mg/dL — ABNORMAL HIGH (ref 0.57–1.00)
GFR calc Af Amer: 41 mL/min/{1.73_m2} — ABNORMAL LOW (ref >59)
GFR calc non Af Amer: 35 mL/min/{1.73_m2} — ABNORMAL LOW (ref >59)
Glucose: 225 mg/dL — ABNORMAL HIGH (ref 65–99)
Phosphorus: 3 mg/dL (ref 3.0–4.3)
Potassium: 5 mmol/L (ref 3.5–5.2)
Sodium: 140 mmol/L (ref 134–144)

## 2020-07-28 ENCOUNTER — Other Ambulatory Visit: Payer: Self-pay | Admitting: Family Medicine

## 2020-07-28 ENCOUNTER — Telehealth: Payer: Self-pay | Admitting: Family Medicine

## 2020-07-28 NOTE — Telephone Encounter (Signed)
Patient aware and agreeable. 

## 2020-07-28 NOTE — Telephone Encounter (Signed)
Would be good to actually look at mouth to verify thrush vs other process.  Would start with adequate hydration, biotene mouth wash.  If no resolution in next week, have her get checked for thrush

## 2020-07-28 NOTE — Telephone Encounter (Signed)
Pt returned missed call from [facility] regarding lab results. Reviewed results with pt per Dr Alver Sorrow notes. Pt voiced understanding.   Pt says she cant remember if she talked to Dr Lajuana Ripple about her mouth issues but says she believes she has thrush in her mouth and wants to know how to treat that.

## 2020-08-02 ENCOUNTER — Encounter (HOSPITAL_COMMUNITY)
Admission: RE | Admit: 2020-08-02 | Discharge: 2020-08-02 | Disposition: A | Discharge status: Home / Self Care | Payer: Medicare PPO | Source: Ambulatory Visit | Attending: Nephrology | Admitting: Nephrology

## 2020-08-02 ENCOUNTER — Encounter (HOSPITAL_COMMUNITY): Payer: Self-pay

## 2020-08-02 ENCOUNTER — Other Ambulatory Visit: Payer: Self-pay

## 2020-08-02 DIAGNOSIS — D509 Iron deficiency anemia, unspecified: Secondary | ICD-10-CM | POA: Insufficient documentation

## 2020-08-02 DIAGNOSIS — N1832 Chronic kidney disease, stage 3b: Secondary | ICD-10-CM | POA: Insufficient documentation

## 2020-08-02 DIAGNOSIS — D631 Anemia in chronic kidney disease: Secondary | ICD-10-CM | POA: Diagnosis not present

## 2020-08-02 LAB — CBC
HCT: 32.3 % — ABNORMAL LOW (ref 36.0–46.0)
Hemoglobin: 9.7 g/dL — ABNORMAL LOW (ref 12.0–15.0)
MCH: 24 pg — ABNORMAL LOW (ref 26.0–34.0)
MCHC: 30 g/dL (ref 30.0–36.0)
MCV: 79.8 fL — ABNORMAL LOW (ref 80.0–100.0)
Platelets: 266 10*3/uL (ref 150–400)
RBC: 4.05 MIL/uL (ref 3.87–5.11)
RDW: 15.2 % (ref 11.5–15.5)
WBC: 4.9 10*3/uL (ref 4.0–10.5)
nRBC: 0 % (ref 0.0–0.2)

## 2020-08-02 LAB — RENAL FUNCTION PANEL
Albumin: 3.6 g/dL (ref 3.5–5.0)
Anion gap: 14 (ref 5–15)
BUN: 56 mg/dL — ABNORMAL HIGH (ref 8–23)
CO2: 21 mmol/L — ABNORMAL LOW (ref 22–32)
Calcium: 9.7 mg/dL (ref 8.9–10.3)
Chloride: 103 mmol/L (ref 98–111)
Creatinine, Ser: 1.71 mg/dL — ABNORMAL HIGH (ref 0.44–1.00)
GFR calc Af Amer: 32 mL/min — ABNORMAL LOW (ref >60)
GFR calc non Af Amer: 28 mL/min — ABNORMAL LOW (ref >60)
Glucose, Bld: 182 mg/dL — ABNORMAL HIGH (ref 70–99)
Phosphorus: 3.1 mg/dL (ref 2.5–4.6)
Potassium: 4.5 mmol/L (ref 3.5–5.1)
Sodium: 138 mmol/L (ref 135–145)

## 2020-08-02 LAB — POCT HEMOGLOBIN-HEMACUE: Hemoglobin: 9.6 g/dL — ABNORMAL LOW (ref 12.0–15.0)

## 2020-08-02 MED ORDER — EPOETIN ALFA-EPBX 3000 UNIT/ML IJ SOLN
3000.0000 [IU] | Freq: Once | INTRAMUSCULAR | Status: AC
  Administered 2020-08-02: 3000 [IU] via SUBCUTANEOUS
  Filled 2020-08-02: qty 1

## 2020-08-02 NOTE — Progress Notes (Deleted)
Office Visit Note  Patient: Wendy [name]             Date of Birth: [birth year]           MRN: [id number]             PCP: Janora Norlander, DO Referring: Janora Norlander, DO Visit Date: 08/15/2020 Occupation: @GUAROCC @  Subjective:  No chief complaint on file.   History of Present Illness: Wendy [name] is a 79 y.o. female ***   Activities of Daily Living:  Patient reports morning stiffness for *** {minute/hour:19697}.   Patient {ACTIONS;DENIES/REPORTS:21021675::"Denies"} nocturnal pain.  Difficulty dressing/grooming: {ACTIONS;DENIES/REPORTS:21021675::"Denies"} Difficulty climbing stairs: {ACTIONS;DENIES/REPORTS:21021675::"Denies"} Difficulty getting out of chair: {ACTIONS;DENIES/REPORTS:21021675::"Denies"} Difficulty using hands for taps, buttons, cutlery, and/or writing: {ACTIONS;DENIES/REPORTS:21021675::"Denies"}  No Rheumatology ROS completed.   PMFS History:  Patient Active Problem List   Diagnosis Date Noted  . Localized edema 07/11/2020  . Cerumen debris on tympanic membrane of both ears 07/11/2020  . Methotrexate, long term, current use 12/05/2017  . CKD stage 3 due to type 2 diabetes mellitus ([place]) 01/13/2015  . Dyslipidemia 01/13/2015  . Memory loss 04/09/2014  . Adrenal nodule ([place]) 02/27/2014  . Thyroid nodule 02/27/2014  . Microcytic anemia 02/25/2014  . Rheumatoid arthritis ([place]) 02/25/2014  . PVD (peripheral vascular disease) ([place]) 12/02/2012  . DYSPHAGIA UNSPECIFIED 08/18/2008  . COLONIC POLYPS 08/17/2008  . Type 2 diabetes mellitus with kidney complication, without long-term current use of insulin ([place]) 08/17/2008  . Essential hypertension 08/17/2008  . DIVERTICULOSIS, COLON 08/18/2002    Past Medical History:  Diagnosis Date  . Acquired hammer toes of both feet   . Anemia   . Anxiety   . Arthritis   . Diabetes mellitus without complication ([place])   . Eczema   . Environmental allergies   . Fibrocystic breast changes   . GERD  (gastroesophageal reflux disease)   . Hearing loss   . Hypercholesteremia   . Hypertension   . IBS (irritable bowel syndrome)   . Incontinence   . Memory loss   . Osteoarthritis   . PVD (peripheral vascular disease) ([place])   . Rheumatoid arthritis ([place])     Family History  Problem Relation Age of Onset  . Diabetes Mother   . Hypertension Mother   . Alzheimer's disease Mother   . Heart disease Father   . Hypertension Sister   . Diabetes Sister   . Heart disease Sister   . Hypertension Brother   . Diabetes Brother   . Cancer Sister        lung  . Heart disease Sister    Past Surgical History:  Procedure Laterality Date  . ABDOMINAL HYSTERECTOMY    . SHOULDER SURGERY     FATTY TUMOR REMOVED   Social History   Social History Narrative   Never Smoked   No Alcohol   No Recreational Drug Use   Retired from Surveyor, minerals work   Marital Status: Divorced since 2003   Children: 5 kids, 12 grandchildren, 39 great grandchildren   Religion: Estate manager/land agent History  Administered Date(s) Administered  . Influenza, High Dose Seasonal PF 08/12/2015, 08/12/2015, 09/26/2017, 09/26/2017, 09/18/2018, 09/18/2018  . Influenza,inj,quad, With Preservative 07/28/2019  . Pneumococcal Conjugate-13 07/15/2015, 07/15/2015  . Pneumococcal Polysaccharide-23 08/03/2016, 08/03/2016  . Tdap 03/01/2017, 03/01/2017     Objective: Vital Signs: There were no vitals taken for this visit.   Physical Exam   Musculoskeletal Exam: ***  CDAI Exam: CDAI Score: --  Patient Global: --; Provider Global: -- Swollen: --; Tender: -- Joint Exam 08/15/2020   No joint exam has been documented for this visit   There is currently no information documented on the homunculus. Go to the Rheumatology activity and complete the homunculus joint exam.  Investigation: No additional findings.  Imaging: No results found.  Recent Labs: Lab Results  Component Value Date   WBC 7.6 05/03/2020    HGB 10.5 (L) 07/18/2020   PLT 476 (H) 05/03/2020   NA 140 07/26/2020   K 5.0 07/26/2020   CL 101 07/26/2020   CO2 25 07/26/2020   GLUCOSE 225 (H) 07/26/2020   BUN 37 (H) 07/26/2020   CREATININE 1.41 (H) 07/26/2020   BILITOT 0.3 05/03/2020   ALKPHOS 64 05/03/2020   AST 16 05/03/2020   ALT 9 05/03/2020   PROT 7.3 05/03/2020   ALBUMIN 4.3 07/26/2020   CALCIUM 10.4 (H) 07/26/2020   GFRAA 41 (L) 07/26/2020    Speciality Comments: No specialty comments available.  Procedures:  No procedures performed Allergies: Codeine, Erythromycin, Leflunomide, Lisinopril, Naproxen, Penicillins, Pioglitazone, and Valsartan   Assessment / Plan:     Visit Diagnoses: Rheumatoid arthritis involving multiple sites, unspecified whether rheumatoid factor present ([place]) - Treated in [place]  High risk medication use - methotrexate 2.5 mg 1 tablet once weekly.  Essential hypertension  PVD (peripheral vascular disease) ([place])  CKD stage 3 due to type 2 diabetes mellitus (HCC)  Thyroid nodule  Adrenal nodule (HCC)  Dyslipidemia  Memory loss  Microcytic anemia  History of diverticulosis  Hx of colonic polyps  Orders: No orders of the defined types were placed in this encounter.  No orders of the defined types were placed in this encounter.   Face-to-face time spent with patient was *** minutes. Greater than 50% of time was spent in counseling and coordination of care.  Follow-Up Instructions: No follow-ups on file.   [name], PA-C  Note - This record has been created using Dragon software.  Chart creation errors have been sought, but may not always  have been located. Such creation errors do not reflect on  the standard of medical care.,

## 2020-08-03 DIAGNOSIS — N189 Chronic kidney disease, unspecified: Secondary | ICD-10-CM | POA: Diagnosis not present

## 2020-08-03 DIAGNOSIS — N170 Acute kidney failure with tubular necrosis: Secondary | ICD-10-CM | POA: Diagnosis not present

## 2020-08-03 DIAGNOSIS — D638 Anemia in other chronic diseases classified elsewhere: Secondary | ICD-10-CM | POA: Diagnosis not present

## 2020-08-03 DIAGNOSIS — I129 Hypertensive chronic kidney disease with stage 1 through stage 4 chronic kidney disease, or unspecified chronic kidney disease: Secondary | ICD-10-CM | POA: Diagnosis not present

## 2020-08-03 DIAGNOSIS — E1122 Type 2 diabetes mellitus with diabetic chronic kidney disease: Secondary | ICD-10-CM | POA: Diagnosis not present

## 2020-08-14 DIAGNOSIS — R110 Nausea: Secondary | ICD-10-CM | POA: Diagnosis not present

## 2020-08-14 DIAGNOSIS — R0981 Nasal congestion: Secondary | ICD-10-CM | POA: Diagnosis not present

## 2020-08-14 DIAGNOSIS — R05 Cough: Secondary | ICD-10-CM | POA: Diagnosis not present

## 2020-08-15 ENCOUNTER — Encounter (HOSPITAL_COMMUNITY)
Admission: RE | Admit: 2020-08-15 | Discharge: 2020-08-15 | Disposition: A | Discharge status: Home / Self Care | Payer: Medicare PPO | Source: Ambulatory Visit | Attending: Nephrology | Admitting: Nephrology

## 2020-08-15 ENCOUNTER — Other Ambulatory Visit: Payer: Self-pay

## 2020-08-15 ENCOUNTER — Encounter (HOSPITAL_COMMUNITY): Payer: Self-pay

## 2020-08-15 ENCOUNTER — Ambulatory Visit: Payer: Medicare PPO | Admitting: Family Medicine

## 2020-08-15 ENCOUNTER — Ambulatory Visit: Payer: Medicare PPO | Admitting: Rheumatology

## 2020-08-15 DIAGNOSIS — N1832 Chronic kidney disease, stage 3b: Secondary | ICD-10-CM | POA: Diagnosis not present

## 2020-08-15 LAB — POCT HEMOGLOBIN-HEMACUE: Hemoglobin: 10 g/dL — ABNORMAL LOW (ref 12.0–15.0)

## 2020-08-15 MED ORDER — EPOETIN ALFA-EPBX 3000 UNIT/ML IJ SOLN: 3000.0000 [IU] | Freq: Once | INTRAMUSCULAR | Status: DC

## 2020-08-16 ENCOUNTER — Ambulatory Visit (HOSPITAL_COMMUNITY): Payer: Medicare PPO

## 2020-08-16 DIAGNOSIS — F419 Anxiety disorder, unspecified: Secondary | ICD-10-CM | POA: Diagnosis not present

## 2020-08-16 DIAGNOSIS — K5900 Constipation, unspecified: Secondary | ICD-10-CM | POA: Diagnosis not present

## 2020-08-16 DIAGNOSIS — E1151 Type 2 diabetes mellitus with diabetic peripheral angiopathy without gangrene: Secondary | ICD-10-CM | POA: Diagnosis not present

## 2020-08-16 DIAGNOSIS — R32 Unspecified urinary incontinence: Secondary | ICD-10-CM | POA: Diagnosis not present

## 2020-08-16 DIAGNOSIS — M069 Rheumatoid arthritis, unspecified: Secondary | ICD-10-CM | POA: Diagnosis not present

## 2020-08-16 DIAGNOSIS — Z79899 Other long term (current) drug therapy: Secondary | ICD-10-CM | POA: Diagnosis not present

## 2020-08-16 DIAGNOSIS — G3184 Mild cognitive impairment, so stated: Secondary | ICD-10-CM | POA: Diagnosis not present

## 2020-08-16 DIAGNOSIS — I10 Essential (primary) hypertension: Secondary | ICD-10-CM | POA: Diagnosis not present

## 2020-08-16 DIAGNOSIS — Z880 Allergy status to penicillin: Secondary | ICD-10-CM | POA: Diagnosis not present

## 2020-08-16 DIAGNOSIS — R636 Underweight: Secondary | ICD-10-CM | POA: Diagnosis not present

## 2020-08-16 DIAGNOSIS — J309 Allergic rhinitis, unspecified: Secondary | ICD-10-CM | POA: Diagnosis not present

## 2020-08-18 ENCOUNTER — Other Ambulatory Visit: Payer: Self-pay | Admitting: *Deleted

## 2020-08-18 DIAGNOSIS — I10 Essential (primary) hypertension: Secondary | ICD-10-CM

## 2020-08-18 DIAGNOSIS — R600 Localized edema: Secondary | ICD-10-CM

## 2020-08-18 MED ORDER — HYDROCHLOROTHIAZIDE 12.5 MG PO TABS: 12.5000 mg | ORAL_TABLET | Freq: Every day | ORAL | 4 refills | Status: DC

## 2020-08-19 DIAGNOSIS — R2689 Other abnormalities of gait and mobility: Secondary | ICD-10-CM | POA: Diagnosis not present

## 2020-08-26 ENCOUNTER — Ambulatory Visit (INDEPENDENT_AMBULATORY_CARE_PROVIDER_SITE_OTHER): Payer: Medicare PPO | Admitting: Family Medicine

## 2020-08-26 ENCOUNTER — Other Ambulatory Visit: Payer: Self-pay

## 2020-08-26 ENCOUNTER — Encounter: Payer: Self-pay | Admitting: Family Medicine

## 2020-08-26 VITALS — BP 160/87 | HR 77 | Temp 97.1°F | Ht 69.0 in | Wt 120.1 lb

## 2020-08-26 DIAGNOSIS — L89511 Pressure ulcer of right ankle, stage 1: Secondary | ICD-10-CM

## 2020-08-26 DIAGNOSIS — R42 Dizziness and giddiness: Secondary | ICD-10-CM

## 2020-08-26 MED ORDER — ZINC OXIDE 10 % EX OINT: 1.0000 "application " | TOPICAL_OINTMENT | Freq: Every day | CUTANEOUS | 2 refills | Status: DC | PRN

## 2020-08-26 MED ORDER — MECLIZINE HCL 25 MG PO TABS: 25.0000 mg | ORAL_TABLET | Freq: Three times a day (TID) | ORAL | 0 refills | Status: DC | PRN

## 2020-08-26 NOTE — Progress Notes (Signed)
BP (!) 160/87   Pulse 77   Temp (!) 97.1 F (36.2 C) (Temporal)   Ht 5\' 9"  (1.753 m)   Wt 120 lb 2 oz (54.5 kg)   BMI 17.74 kg/m    Subjective:   Patient ID: Wendy [name], female    DOB: Sep 30, 1941, 78 y.o.   MRN: [id number]  HPI: Wendy [name] is a 79 y.o. female presenting on 08/26/2020 for Dizziness and Wound Check   HPI Patient is coming in complaining of dizziness and spinning sensation that started last week and went on for couple days and then it got better and then she has been having a little bit since yesterday but it is better today.  She describes it as a spinning and swelling sensation that she gets that comes and goes and some nausea with it.  Patient is coming in for wound on buttock that has been there a month and has been bothering her and wants it looked at.   Relevant past medical, surgical, family and social history reviewed and updated as indicated. Interim medical history since our last visit reviewed. Allergies and medications reviewed and updated.  Review of Systems  Constitutional: Negative for chills and fever.  Eyes: Negative for visual disturbance.  Respiratory: Negative for chest tightness and shortness of breath.   Cardiovascular: Negative for chest pain and leg swelling.  Musculoskeletal: Negative for back pain and gait problem.  Skin: Positive for wound. Negative for rash.  Neurological: Positive for dizziness. Negative for light-headedness and headaches.  Psychiatric/Behavioral: Negative for agitation and behavioral problems.  All other systems reviewed and are negative.   Per HPI unless specifically indicated above   Allergies as of 08/26/2020      Reactions   Codeine    Erythromycin    Leflunomide Other (See Comments)   Lisinopril    Naproxen    Penicillins    Pioglitazone    Valsartan Other (See Comments)      Medication List       Accurate as of August 26, 2020 10:52 AM. If you have any questions, ask your nurse or doctor.         STOP taking these medications   Debrox 6.5 % OTIC solution Generic drug: carbamide peroxide Stopped by: Fransisca Kaufmann Tynslee Bowlds, MD     TAKE these medications   aspirin EC 81 MG tablet Take 81 mg by mouth daily.   chlorthalidone 25 MG tablet Commonly known as: HYGROTON Take by mouth.   Cholecalciferol 25 MCG (1000 UT) tablet Take 1 tablet by mouth daily.   cilostazol 100 MG tablet Commonly known as: PLETAL Take 1 tablet (100 mg total) by mouth daily.   cloNIDine 0.3 mg/24hr patch Commonly known as: CATAPRES - Dosed in mg/24 hr Place 1 patch (0.3 mg total) onto the skin once a week.   cycloSPORINE 0.05 % ophthalmic emulsion Commonly known as: RESTASIS Place 1 drop into both eyes 2 (two) times daily.   diclofenac Sodium 1 % Gel Commonly known as: VOLTAREN Apply 2 g topically daily.   folic acid 1 MG tablet Commonly known as: FOLVITE Take 1 tablet (1 mg total) by mouth daily.   hydrochlorothiazide 12.5 MG tablet Commonly known as: HYDRODIURIL Take 1 tablet (12.5 mg total) by mouth daily.   irbesartan 300 MG tablet Commonly known as: AVAPRO Take 1 tablet (300 mg total) by mouth every morning.   memantine 10 MG tablet Commonly known as: NAMENDA Take 1 tablet (10 mg total)  by mouth in the morning and at bedtime.   metFORMIN 1000 MG tablet Commonly known as: GLUCOPHAGE Take 0.5 tablets (500 mg total) by mouth 2 (two) times daily with a meal. Renal dose   methotrexate 2.5 MG tablet Commonly known as: RHEUMATREX Take 1 tablet (2.5 mg total) by mouth once a week.   Retacrit 3000 UNIT/ML injection Generic drug: epoetin alfa-epbx 3,000 Units every 14 (fourteen) days.   rivastigmine 9.5 mg/24hr Commonly known as: EXELON APPLY 1 PATCH TOPICALLY ONCE DAILY   sitaGLIPtin 50 MG tablet Commonly known as: Januvia Take 1 tablet (50 mg total) by mouth daily.        Objective:   BP (!) 160/87   Pulse 77   Temp (!) 97.1 F (36.2 C) (Temporal)   Ht 5\' 9"   (1.753 m)   Wt 120 lb 2 oz (54.5 kg)   BMI 17.74 kg/m   Wt Readings from Last 3 Encounters:  08/26/20 120 lb 2 oz (54.5 kg)  08/02/20 118 lb 9.7 oz (53.8 kg)  07/26/20 118 lb 9.6 oz (53.8 kg)    Physical Exam Vitals and nursing note reviewed.  Constitutional:      General: She is not in acute distress.    Appearance: She is well-developed. She is not diaphoretic.  Eyes:     Conjunctiva/sclera: Conjunctivae normal.  Cardiovascular:     Rate and Rhythm: Normal rate and regular rhythm.     Heart sounds: Normal heart sounds. No murmur heard.   Pulmonary:     Effort: Pulmonary effort is normal. No respiratory distress.     Breath sounds: Normal breath sounds. No wheezing.  Musculoskeletal:        General: No tenderness. Normal range of motion.  Skin:    General: Skin is warm and dry.     Findings: Lesion (Very superficial small about the size of a nickel sore on right inner gluteal fold and even smaller on left inner gluteal fold, only superficial skin breakdown) present. No rash.  Neurological:     Mental Status: She is alert and oriented to person, place, and time.     Coordination: Coordination normal.  Psychiatric:        Behavior: Behavior normal.       Assessment & Plan:   Problem List Items Addressed This Visit    None    Visit Diagnoses    Vertigo    -  Primary   Relevant Medications   meclizine (ANTIVERT) 25 MG tablet   Pressure injury of right ankle, stage 1       Relevant Medications   Zinc Oxide 10 % OINT      Patient has a very superficial start of a pressure sore on her gluteal folds on both sides but worse on the right.  Recommended barrier cream daily Follow up plan: Return if symptoms worsen or fail to improve.  Counseling provided for all of the vaccine components No orders of the defined types were placed in this encounter.   Caryl Pina, MD [facility] 08/26/2020, 10:52 AM

## 2020-08-30 ENCOUNTER — Encounter (HOSPITAL_COMMUNITY): Admission: RE | Admit: 2020-08-30 | Payer: Medicare PPO | Source: Ambulatory Visit

## 2020-08-30 ENCOUNTER — Encounter (HOSPITAL_COMMUNITY): Payer: Medicare PPO

## 2020-08-30 ENCOUNTER — Inpatient Hospital Stay (HOSPITAL_COMMUNITY): Admission: RE | Admit: 2020-08-30 | Payer: Medicare PPO | Source: Ambulatory Visit

## 2020-08-31 ENCOUNTER — Encounter (HOSPITAL_COMMUNITY): Admission: RE | Admit: 2020-08-31 | Payer: Medicare PPO | Source: Ambulatory Visit

## 2020-08-31 ENCOUNTER — Encounter (HOSPITAL_COMMUNITY): Payer: Medicare PPO | Attending: Nephrology

## 2020-09-06 ENCOUNTER — Ambulatory Visit (INDEPENDENT_AMBULATORY_CARE_PROVIDER_SITE_OTHER): Payer: Medicare PPO

## 2020-09-06 DIAGNOSIS — Z0000 Encounter for general adult medical examination without abnormal findings: Secondary | ICD-10-CM

## 2020-09-06 NOTE — Patient Instructions (Signed)
  Wendy [name] , Thank you for taking time to come for your Medicare Wellness Visit. I appreciate your ongoing commitment to your health goals. Please review the following plan we discussed and let me know if I can assist you in the future.   These are the goals we discussed: Goals    . DIET - EAT MORE FRUITS AND VEGETABLES    . Exercise 150 min/wk Moderate Activity       This is a list of the screening recommended for you and due dates:  Health Maintenance  Topic Date Due  . Complete foot exam   Never done  . Eye exam for diabetics  Never done  . COVID-19 Vaccine (1) Never done  . DEXA scan (bone density measurement)  Never done  . Flu Shot  06/26/2020  . Hemoglobin A1C  01/23/2021  . Tetanus Vaccine  03/02/2027  . Pneumonia vaccines  Completed

## 2020-09-06 NOTE — Progress Notes (Signed)
MEDICARE ANNUAL WELLNESS VISIT  09/06/2020  Telephone Visit Disclaimer This Medicare AWV was conducted by telephone due to national recommendations for restrictions regarding the COVID-19 Pandemic (e.g. social distancing).  I verified, using two identifiers, that I am speaking with Wendy [name] or their authorized healthcare agent. I discussed the limitations, risks, security, and privacy concerns of performing an evaluation and management service by telephone and the potential availability of an in-person appointment in the future. The patient expressed understanding and agreed to proceed.  Location of Patient: Home Location of Provider (nurse):  [facility]  Subjective:    Wendy [name] is a 79 y.o. female patient of Wendy Norlander, DO who had a Medicare Annual Wellness Visit today via telephone. Diondra lives locally here in [place] and currently her son is staying with her. She has 5 children in all but the other four all live in different states. 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 She feels her health maybe a little better than it was this time last year but she states that she hasn't been able to do much in the last year because she is unable to drive. She states she has to be cleared to be able to drive again. She doesn't exercise on a regular basis.   Patient Care Team: Wendy Norlander, DO as PCP - General (Family Medicine)  Advanced Directives 09/06/2020  Does Patient Have a Medical Advance Directive? Yes  Does patient want to make changes to medical advance directive? No - Patient declined    Hospital Utilization Over the Past 12 Months: # of hospitalizations or ER visits: 0 # of surgeries: 0  Review of Systems    Patient reports that her overall health is better compared to last year.  History obtained from chart review  Patient Reported Readings (BP, Pulse, CBG, Weight, etc) none  Pain  Assessment Pain : No/denies pain     Current Medications & Allergies (verified) Allergies as of 09/06/2020      Reactions   Codeine    Erythromycin    Leflunomide Other (See Comments)   Lisinopril    Naproxen    Penicillins    Pioglitazone    Valsartan Other (See Comments)      Medication List       Accurate as of September 06, 2020  1:57 PM. If you have any questions, ask your nurse or doctor.        aspirin EC 81 MG tablet Take 81 mg by mouth daily.   chlorthalidone 25 MG tablet Commonly known as: HYGROTON Take by mouth.   Cholecalciferol 25 MCG (1000 UT) tablet Take 1 tablet by mouth daily.   cilostazol 100 MG tablet Commonly known as: PLETAL Take 1 tablet (100 mg total) by mouth daily.   cloNIDine 0.3 mg/24hr patch Commonly known as: CATAPRES - Dosed in mg/24 hr Place 1 patch (0.3 mg total) onto the skin once a week.   cycloSPORINE 0.05 % ophthalmic emulsion Commonly known as: RESTASIS Place 1 drop into both eyes 2 (two) times daily.   diclofenac Sodium 1 % Gel Commonly known as: VOLTAREN Apply 2 g topically daily.   folic acid 1 MG tablet Commonly known as: FOLVITE Take 1 tablet (1 mg total) by mouth daily.   hydrochlorothiazide 12.5 MG tablet Commonly known as: HYDRODIURIL Take 1 tablet (12.5 mg total) by mouth daily.   irbesartan 300 MG tablet Commonly  known as: AVAPRO Take 1 tablet (300 mg total) by mouth every morning.   meclizine 25 MG tablet Commonly known as: ANTIVERT Take 1 tablet (25 mg total) by mouth 3 (three) times daily as needed for dizziness.   memantine 10 MG tablet Commonly known as: NAMENDA Take 1 tablet (10 mg total) by mouth in the morning and at bedtime.   metFORMIN 1000 MG tablet Commonly known as: GLUCOPHAGE Take 0.5 tablets (500 mg total) by mouth 2 (two) times daily with a meal. Renal dose   methotrexate 2.5 MG tablet Commonly known as: RHEUMATREX Take 1 tablet (2.5 mg total) by mouth once a week.   Retacrit  3000 UNIT/ML injection Generic drug: epoetin alfa-epbx 3,000 Units every 14 (fourteen) days.   rivastigmine 9.5 mg/24hr Commonly known as: EXELON APPLY 1 PATCH TOPICALLY ONCE DAILY   sitaGLIPtin 50 MG tablet Commonly known as: Januvia Take 1 tablet (50 mg total) by mouth daily.   Zinc Oxide 10 % Oint Apply 1 application topically daily as needed.       History (reviewed): Past Medical History:  Diagnosis Date  . Acquired hammer toes of both feet   . Anemia   . Anxiety   . Arthritis   . Diabetes mellitus without complication ([place])   . Eczema   . Environmental allergies   . Fibrocystic breast changes   . GERD (gastroesophageal reflux disease)   . Hearing loss   . Hypercholesteremia   . Hypertension   . IBS (irritable bowel syndrome)   . Incontinence   . Memory loss   . Osteoarthritis   . PVD (peripheral vascular disease) ([place])   . Rheumatoid arthritis [facility])    Past Surgical History:  Procedure Laterality Date  . ABDOMINAL HYSTERECTOMY    . SHOULDER SURGERY     FATTY TUMOR REMOVED   Family History  Problem Relation Age of Onset  . Diabetes Mother   . Hypertension Mother   . Alzheimer's disease Mother   . Heart disease Father   . Hypertension Sister   . Diabetes Sister   . Heart disease Sister   . Hypertension Brother   . Diabetes Brother   . Cancer Sister        lung  . Heart disease Sister    Social History   Socioeconomic History  . Marital status: Divorced    Spouse name: Not on file  . Number of children: Not on file  . Years of education: Not on file  . Highest education level: Not on file  Occupational History  . Occupation: Retired    Comment: Social research officer, government for [employer]  . Smoking status: Never Smoker  . Smokeless tobacco: Never Used  Vaping Use  . Vaping Use: Never used  Substance and Sexual Activity  . Alcohol use: No  . Drug use: No  . Sexual activity: Not on file  Other Topics Concern  . Not on file  Social  History Narrative   Never Smoked   No Alcohol   No Recreational Drug Use   Retired from Surveyor, minerals work   Marital Status: Divorced since 2003   Children: 5 kids, 12 grandchildren, 54 great grandchildren   Religion: Psychologist, occupational   Social Determinants of Radio broadcast assistant Strain:   . Difficulty of Paying Living Expenses: Not on file  Food Insecurity:   . Worried About Charity fundraiser in the Last Year: Not on file  . Ran Out of Food  in the Last Year: Not on file  Transportation Needs:   . Lack of Transportation (Medical): Not on file  . Lack of Transportation (Non-Medical): Not on file  Physical Activity:   . Days of Exercise per Week: Not on file  . Minutes of Exercise per Session: Not on file  Stress:   . Feeling of Stress : Not on file  Social Connections:   . Frequency of Communication with Friends and Family: Not on file  . Frequency of Social Gatherings with Friends and Family: Not on file  . Attends Religious Services: Not on file  . Active Member of Clubs or Organizations: Not on file  . Attends Archivist Meetings: Not on file  . Marital Status: Not on file    Activities of Daily Living In your present state of health, do you have any difficulty performing the following activities: 09/06/2020  Hearing? N  Vision? N  Difficulty concentrating or making decisions? N  Walking or climbing stairs? N  Dressing or bathing? N  Doing errands, shopping? N  Using the Toilet? N  In the past six months, have you accidently leaked urine? N  Do you have problems with loss of bowel control? N  Managing your Medications? N  Managing your Finances? N  Housekeeping or managing your Housekeeping? N  Some recent data might be hidden    Patient Education/ Literacy How often do you need to have someone help you when you read instructions, pamphlets, or other written materials from your doctor or pharmacy?: 1 - Never What is the last grade level you  completed in school?: GED  Exercise Current Exercise Habits: The patient does not participate in regular exercise at present, Exercise limited by: None identified  Diet Patient reports consuming 3 meals a day and 2 snack(s) a day Patient reports that her primary diet is: Regular Patient reports that she does have regular access to food.   Depression Screen PHQ 2/9 Scores 08/26/2020 07/26/2020 07/26/2020 07/11/2020 06/01/2020 05/03/2020 03/02/2020  PHQ - 2 Score 1 3 0 0 0 0 2  PHQ- 9 Score - 12 - - - - 4     Fall Risk Fall Risk  09/06/2020 08/26/2020 07/26/2020 07/11/2020 06/01/2020  Falls in the past year? 0 0 0 0 0  Number falls in past yr: - - - 0 -  Injury with Fall? - - - 0 -  Risk for fall due to : - - - No Fall Risks -     Objective:  Aiesha V Bressman seemed alert and oriented and she participated appropriately during our telephone visit.  Blood Pressure Weight BMI  BP Readings from Last 3 Encounters:  08/26/20 (!) 160/87  08/15/20 (!) 167/94  08/02/20 (!) 144/65   Wt Readings from Last 3 Encounters:  08/26/20 120 lb 2 oz (54.5 kg)  08/02/20 118 lb 9.7 oz (53.8 kg)  07/26/20 118 lb 9.6 oz (53.8 kg)   BMI Readings from Last 1 Encounters:  08/26/20 17.74 kg/m    *Unable to obtain current vital signs, weight, and BMI due to telephone visit type  Hearing/Vision  . Fontella did not seem to have difficulty with hearing/understanding during the telephone conversation . Reports that she has had a formal eye exam by an eye care professional within the past year . Reports that she has not had a formal hearing evaluation within the past year *Unable to fully assess hearing and vision during telephone visit type  Cognitive Function: 6CIT Screen  09/06/2020  What Year? 0 points  What month? 0 points  What time? 0 points  Count back from 20 0 points  Months in reverse 0 points  Repeat phrase 0 points  Total Score 0   (Normal:0-7, Significant for Dysfunction: >8)  Normal Cognitive  Function Screening: Yes   Immunization & Health Maintenance Record Immunization History  Administered Date(s) Administered  . Influenza, High Dose Seasonal PF 08/12/2015, 08/12/2015, 09/26/2017, 09/26/2017, 09/18/2018, 09/18/2018  . Influenza,inj,quad, With Preservative 07/28/2019  . Pneumococcal Conjugate-13 07/15/2015, 07/15/2015  . Pneumococcal Polysaccharide-23 08/03/2016, 08/03/2016  . Tdap 03/01/2017, 03/01/2017    Health Maintenance  Topic Date Due  . FOOT EXAM  Never done  . OPHTHALMOLOGY EXAM  Never done  . COVID-19 Vaccine (1) Never done  . DEXA SCAN  Never done  . INFLUENZA VACCINE  06/26/2020  . HEMOGLOBIN A1C  01/23/2021  . TETANUS/TDAP  03/02/2027  . PNA vac Low Risk Adult  Completed       Assessment  This is a routine wellness examination for Wendy [name].  Health Maintenance: Due or Overdue Health Maintenance Due  Topic Date Due  . FOOT EXAM  Never done  . OPHTHALMOLOGY EXAM  Never done  . COVID-19 Vaccine (1) Never done  . DEXA SCAN  Never done  . INFLUENZA VACCINE  06/26/2020    Wendy [name] does not need a referral for Community Assistance: Care Management:   no Social Work:    no Prescription Assistance:  no Nutrition/Diabetes Education:  no   Plan:  Personalized Goals Goals Addressed            This Visit's Progress   . DIET - EAT MORE FRUITS AND VEGETABLES      . Exercise 150 min/wk Moderate Activity        Personalized Health Maintenance & Screening Recommendations  Influenza vaccine  Lung Cancer Screening Recommended: no (Low Dose CT Chest recommended if Age 40-80 years, 30 pack-year currently smoking OR have quit w/in past 15 years) Hepatitis C Screening recommended: no HIV Screening recommended: no  Advanced Directives: Written information was not prepared per patient's request.  Referrals & Orders No orders of the defined types were placed in this encounter.   Follow-up Plan . Follow-up with Wendy Norlander, DO  as planned . Schedule for flu vaccine    I have personally reviewed and noted the following in the patient's chart:   . Medical and social history . Use of alcohol, tobacco or illicit drugs  . Current medications and supplements . Functional ability and status . Nutritional status . Physical activity . Advanced directives . List of other physicians . Hospitalizations, surgeries, and ER visits in previous 12 months . Vitals . Screenings to include cognitive, depression, and falls . Referrals and appointments  In addition, I have reviewed and discussed with Wendy [name] certain preventive protocols, quality metrics, and best practice recommendations. A written personalized care plan for preventive services as well as general preventive health recommendations is available and can be mailed to the patient at her request.      Rolena Infante LPN [id number]

## 2020-09-07 ENCOUNTER — Other Ambulatory Visit: Payer: Self-pay | Admitting: Family Medicine

## 2020-09-07 ENCOUNTER — Encounter (HOSPITAL_COMMUNITY)
Admission: RE | Admit: 2020-09-07 | Discharge: 2020-09-07 | Disposition: A | Discharge status: Home / Self Care | Payer: Medicare PPO | Source: Ambulatory Visit | Attending: Nephrology | Admitting: Nephrology

## 2020-09-07 ENCOUNTER — Ambulatory Visit: Payer: Medicare PPO | Admitting: Rheumatology

## 2020-09-07 ENCOUNTER — Encounter (HOSPITAL_COMMUNITY): Payer: Self-pay

## 2020-09-07 ENCOUNTER — Other Ambulatory Visit: Payer: Self-pay

## 2020-09-07 DIAGNOSIS — D631 Anemia in chronic kidney disease: Secondary | ICD-10-CM | POA: Diagnosis not present

## 2020-09-07 DIAGNOSIS — R809 Proteinuria, unspecified: Secondary | ICD-10-CM | POA: Diagnosis not present

## 2020-09-07 DIAGNOSIS — N189 Chronic kidney disease, unspecified: Secondary | ICD-10-CM | POA: Diagnosis not present

## 2020-09-07 DIAGNOSIS — N1832 Chronic kidney disease, stage 3b: Secondary | ICD-10-CM | POA: Diagnosis present

## 2020-09-07 DIAGNOSIS — E1122 Type 2 diabetes mellitus with diabetic chronic kidney disease: Secondary | ICD-10-CM | POA: Diagnosis not present

## 2020-09-07 DIAGNOSIS — I129 Hypertensive chronic kidney disease with stage 1 through stage 4 chronic kidney disease, or unspecified chronic kidney disease: Secondary | ICD-10-CM | POA: Diagnosis not present

## 2020-09-07 DIAGNOSIS — D638 Anemia in other chronic diseases classified elsewhere: Secondary | ICD-10-CM | POA: Diagnosis not present

## 2020-09-07 DIAGNOSIS — N170 Acute kidney failure with tubular necrosis: Secondary | ICD-10-CM | POA: Diagnosis not present

## 2020-09-07 DIAGNOSIS — E1129 Type 2 diabetes mellitus with other diabetic kidney complication: Secondary | ICD-10-CM | POA: Diagnosis not present

## 2020-09-07 HISTORY — DX: Chronic kidney disease, unspecified: N18.9

## 2020-09-07 LAB — RENAL FUNCTION PANEL
Albumin: 3.3 g/dL — ABNORMAL LOW (ref 3.5–5.0)
Anion gap: 9 (ref 5–15)
BUN: 29 mg/dL — ABNORMAL HIGH (ref 8–23)
CO2: 24 mmol/L (ref 22–32)
Calcium: 9.6 mg/dL (ref 8.9–10.3)
Chloride: 106 mmol/L (ref 98–111)
Creatinine, Ser: 1.26 mg/dL — ABNORMAL HIGH (ref 0.44–1.00)
GFR, Estimated: 40 mL/min — ABNORMAL LOW (ref >60)
Glucose, Bld: 133 mg/dL — ABNORMAL HIGH (ref 70–99)
Phosphorus: 3.3 mg/dL (ref 2.5–4.6)
Potassium: 4.7 mmol/L (ref 3.5–5.1)
Sodium: 139 mmol/L (ref 135–145)

## 2020-09-07 LAB — POCT HEMOGLOBIN-HEMACUE: Hemoglobin: 9 g/dL — ABNORMAL LOW (ref 12.0–15.0)

## 2020-09-07 MED ORDER — EPOETIN ALFA-EPBX 3000 UNIT/ML IJ SOLN
INTRAMUSCULAR | Status: AC
  Filled 2020-09-07: qty 1

## 2020-09-07 MED ORDER — EPOETIN ALFA-EPBX 3000 UNIT/ML IJ SOLN
3000.0000 [IU] | Freq: Once | INTRAMUSCULAR | Status: AC
  Administered 2020-09-07: 3000 [IU] via SUBCUTANEOUS

## 2020-09-07 MED ORDER — CLONIDINE HCL 0.2 MG/24HR TD PTWK
0.2000 mg | MEDICATED_PATCH | TRANSDERMAL | 0 refills | Status: DC
Start: 2020-09-07 — End: 2022-12-04

## 2020-09-12 ENCOUNTER — Telehealth: Payer: Self-pay

## 2020-09-12 ENCOUNTER — Other Ambulatory Visit: Payer: Self-pay

## 2020-09-12 ENCOUNTER — Ambulatory Visit: Payer: Medicare PPO | Admitting: Family Medicine

## 2020-09-12 ENCOUNTER — Encounter: Payer: Self-pay | Admitting: Family Medicine

## 2020-09-12 VITALS — BP 173/89 | HR 60 | Temp 96.9°F | Ht 69.0 in | Wt 120.0 lb

## 2020-09-12 DIAGNOSIS — Z23 Encounter for immunization: Secondary | ICD-10-CM

## 2020-09-12 DIAGNOSIS — E440 Moderate protein-calorie malnutrition: Secondary | ICD-10-CM | POA: Diagnosis not present

## 2020-09-12 DIAGNOSIS — I10 Essential (primary) hypertension: Secondary | ICD-10-CM | POA: Diagnosis not present

## 2020-09-12 DIAGNOSIS — E1122 Type 2 diabetes mellitus with diabetic chronic kidney disease: Secondary | ICD-10-CM | POA: Diagnosis not present

## 2020-09-12 DIAGNOSIS — N183 Chronic kidney disease, stage 3 unspecified: Secondary | ICD-10-CM | POA: Diagnosis not present

## 2020-09-12 DIAGNOSIS — M069 Rheumatoid arthritis, unspecified: Secondary | ICD-10-CM | POA: Diagnosis not present

## 2020-09-12 DIAGNOSIS — K062 Gingival and edentulous alveolar ridge lesions associated with trauma: Secondary | ICD-10-CM

## 2020-09-12 NOTE — Progress Notes (Signed)
Subjective: CC: swelling PCP: Janora Norlander, DO Wendy [name] is a 79 y.o. female presenting to clinic today for:  1.  Mouth irritation/malnutrition/CKD 3 Patient reports that she is not here for swelling but in fact here for irritation in her mouth.  She had some dentures adjusted outside of the state but this did not fix them and they still rub quite a bit in her mouth.  She is for this reason not really using them.  She is also concerned about her weight.  She feels like she "looks like a skeleton".  She feels that she eats at least 2 good meals per day.  However, what she eats is limited secondary to inability to chew.  She also finds it hard to balance what she eats between her diabetes and her CKD.  She notes that her blood sugars tend to be higher even if she is not eating much.  She does want to go back on insulin.  She would really like to see a dietitian if possible.  She did not bring any of her medicines in today because she did not know that she was supposed to.  She also did not take any of her medicines this morning because she was trying to rush out the house.   ROS: Per HPI  Allergies  Allergen Reactions  . Codeine   . Erythromycin   . Leflunomide Other (See Comments)  . Lisinopril   . Naproxen   . Penicillins   . Pioglitazone   . Valsartan Other (See Comments)   Past Medical History:  Diagnosis Date  . Acquired hammer toes of both feet   . Anemia   . Anxiety   . Arthritis   . Chronic kidney disease   . Diabetes mellitus without complication ([place])   . Eczema   . Environmental allergies   . Fibrocystic breast changes   . GERD (gastroesophageal reflux disease)   . Hearing loss   . Hypercholesteremia   . Hypertension   . IBS (irritable bowel syndrome)   . Incontinence   . Memory loss   . Osteoarthritis   . PVD (peripheral vascular disease) ([place])   . Rheumatoid arthritis ([facility])     Current Outpatient Medications:  .  aspirin EC 81 MG tablet,  Take 81 mg by mouth daily., Disp: , Rfl:  .  Cholecalciferol 25 MCG (1000 UT) tablet, Take 1 tablet by mouth daily., Disp: , Rfl:  .  cilostazol (PLETAL) 100 MG tablet, Take 1 tablet (100 mg total) by mouth daily., Disp: 90 tablet, Rfl: 3 .  cloNIDine (CATAPRES - DOSED IN MG/24 HR) 0.2 mg/24hr patch, Place 1 patch (0.2 mg total) onto the skin once a week., Disp: 4 patch, Rfl: 0 .  cycloSPORINE (RESTASIS) 0.05 % ophthalmic emulsion, Place 1 drop into both eyes 2 (two) times daily., Disp: 0.4 mL, Rfl: 5 .  diclofenac Sodium (VOLTAREN) 1 % GEL, Apply 2 g topically daily., Disp: , Rfl:  .  epoetin alfa-epbx (RETACRIT) 3000 UNIT/ML injection, 3,000 Units every 14 (fourteen) days., Disp: , Rfl:  .  folic acid (FOLVITE) 1 MG tablet, Take 1 tablet (1 mg total) by mouth daily., Disp: 90 tablet, Rfl: 3 .  hydrochlorothiazide (HYDRODIURIL) 12.5 MG tablet, Take 1 tablet (12.5 mg total) by mouth daily., Disp: 30 tablet, Rfl: 4 .  meclizine (ANTIVERT) 25 MG tablet, Take 1 tablet (25 mg total) by mouth 3 (three) times daily as needed for dizziness., Disp: 30 tablet, Rfl:  0 .  memantine (NAMENDA) 10 MG tablet, Take 1 tablet (10 mg total) by mouth in the morning and at bedtime., Disp: 90 tablet, Rfl: 3 .  metFORMIN (GLUCOPHAGE) 1000 MG tablet, Take 0.5 tablets (500 mg total) by mouth 2 (two) times daily with a meal. Renal dose, Disp: 180 tablet, Rfl: 1 .  methotrexate (RHEUMATREX) 2.5 MG tablet, Take 1 tablet (2.5 mg total) by mouth once a week., Disp: 12 tablet, Rfl: 0 .  rivastigmine (EXELON) 9.5 mg/24hr, APPLY 1 PATCH TOPICALLY ONCE DAILY, Disp: 30 patch, Rfl: 2 .  sitaGLIPtin (JANUVIA) 50 MG tablet, Take 1 tablet (50 mg total) by mouth daily., Disp: 90 tablet, Rfl: 3 .  Zinc Oxide 10 % OINT, Apply 1 application topically daily as needed., Disp: 226 g, Rfl: 2 Social History   Socioeconomic History  . Marital status: Divorced    Spouse name: Not on file  . Number of children: Not on file  . Years of  education: Not on file  . Highest education level: Not on file  Occupational History  . Occupation: Retired    Comment: Social research officer, government for [employer]  . Smoking status: Never Smoker  . Smokeless tobacco: Never Used  Vaping Use  . Vaping Use: Never used  Substance and Sexual Activity  . Alcohol use: No  . Drug use: No  . Sexual activity: Not on file  Other Topics Concern  . Not on file  Social History Narrative   Never Smoked   No Alcohol   No Recreational Drug Use   Retired from Surveyor, minerals work   Marital Status: Divorced since 2003   Children: 5 kids, 12 grandchildren, 56 great grandchildren   Religion: Psychologist, occupational   Social Determinants of Radio broadcast assistant Strain:   . Difficulty of Paying Living Expenses: Not on file  Food Insecurity:   . Worried About Charity fundraiser in the Last Year: Not on file  . Ran Out of Food in the Last Year: Not on file  Transportation Needs:   . Lack of Transportation (Medical): Not on file  . Lack of Transportation (Non-Medical): Not on file  Physical Activity:   . Days of Exercise per Week: Not on file  . Minutes of Exercise per Session: Not on file  Stress:   . Feeling of Stress : Not on file  Social Connections:   . Frequency of Communication with Friends and Family: Not on file  . Frequency of Social Gatherings with Friends and Family: Not on file  . Attends Religious Services: Not on file  . Active Member of Clubs or Organizations: Not on file  . Attends Archivist Meetings: Not on file  . Marital Status: Not on file  Intimate Partner Violence:   . Fear of Current or Ex-Partner: Not on file  . Emotionally Abused: Not on file  . Physically Abused: Not on file  . Sexually Abused: Not on file   Family History  Problem Relation Age of Onset  . Diabetes Mother   . Hypertension Mother   . Alzheimer's disease Mother   . Heart disease Father   . Hypertension Sister   . Diabetes Sister    . Heart disease Sister   . Hypertension Brother   . Diabetes Brother   . Cancer Sister        lung  . Heart disease Sister     Objective: Office vital signs reviewed. BP (!) 173/89  Pulse 60   Temp (!) 96.9 F (36.1 C) (Temporal)   Ht 5\' 9"  (1.753 m)   Wt 120 lb (54.4 kg)   SpO2 100%   BMI 17.72 kg/m   Physical Examination:  General: Awake, alert, thin and frail appearing, No acute distress HEENT: Normal; sclera white.  Oropharynx without masses.  No appreciable abrasions, erythema or soft tissue swelling within the mouth Cardio: regular rate and rhythm, S1S2 heard, no murmurs appreciated Pulm: clear to auscultation bilaterally, no wheezes, rhonchi or rales; normal work of breathing on room air Extremities: warm, well perfused, No appreciable edema, cyanosis or clubbing; +2 pulses bilaterally MSK: Slow gait and hunched station Skin: dry; intact; no rashes or lesions Neuro: Follows commands  Assessment/ Plan: 79 y.o. female   1. CKD stage 3 due to type 2 diabetes mellitus ([place]) Being closely monitored by nephrology.  I was actually able to speak with her nephrologist last week and there was concern for her not knowing what medicine she should be taking.  She was supposed to bring these to our office today but unfortunately did not do so.  I am going to see if I can get her plugged into pharmacy, clinical social work and nurse with [facility].  I would like them to do a thorough med review with the patient.  I think that she is in fact supposed to be on chlorthalidone but has been taking her hydrochlorothiazide as well. - Amb ref to Medical Nutrition Therapy-MNT - Referral to Chronic Care Management Services  2. Malnutrition of moderate degree ([place]) Referral to nutrition also placed as she is malnourished with a BMI of less than 19. - Amb ref to Medical Nutrition Therapy-MNT - Referral to Chronic Care Management Services  3. Elevated blood pressure reading in office with  diagnosis of hypertension Likely secondary to have not taken any of her medicines this morning.  May need to consider increasing the clonidine patch back up to 0.3 - Amb ref to Medical Nutrition Therapy-MNT - Referral to Chronic Care Management Services  4. Rheumatoid arthritis involving multiple sites, unspecified whether rheumatoid factor present ([place]) No longer taking her methotrexate as she does not want to continue seeing a rheumatologist.  Denied any exacerbation in joint pain today - Referral to Chronic Care Management Services  5. Denture irritation Highly recommended that she see a dentist locally for adjustment of her dentures as this may be impacting her ability to get sufficient calories then.  We will see if CCM might be able to help [place] with this - Referral to Chronic Care Management Services  6. Need for immunization against influenza Administered during today's visit - Flu Vaccine QUAD High Dose(Fluad)   No orders of the defined types were placed in this encounter.  No orders of the defined types were placed in this encounter.    Janora Norlander, DO [place] [phone]

## 2020-09-12 NOTE — Chronic Care Management (AMB) (Signed)
  Chronic Care Management   Note  09/12/2020 Name: Wendy [name] MRN: [id number] DOB: 1941/01/05  Wendy [name] is a 79 y.o. year old female who is a primary care patient of Janora Norlander, DO. I reached out to Wendy [name] by phone today in response to a referral sent by Ms. Anai V Minarik's PCP, Adam Phenix, MD     [name] was given information about Chronic Care Management services today including:  1. CCM service includes personalized support from designated clinical staff supervised by her physician, including individualized plan of care and coordination with other care providers 2. 24/7 contact phone numbers for assistance for urgent and routine care needs. 3. Service will only be billed when office clinical staff spend 20 minutes or more in a month to coordinate care. 4. Only one practitioner may furnish and bill the service in a calendar month. 5. The patient may stop CCM services at any time (effective at the end of the month) by phone call to the office staff. 6. The patient will be responsible for cost sharing (co-pay) of up to 20% of the service fee (after annual deductible is met).  Patient agreed to services and verbal consent obtained.   Follow up plan: Telephone appointment with care management team member scheduled for: [facility]  10/05/2020 LCSW 10/18/2020  Noreene Larsson, [place], [place], [place] [address] Direct Dial: [phone] [email or web address]: [email or web address]

## 2020-09-15 ENCOUNTER — Ambulatory Visit: Payer: Medicare PPO | Admitting: Neurology

## 2020-09-18 DIAGNOSIS — R2689 Other abnormalities of gait and mobility: Secondary | ICD-10-CM | POA: Diagnosis not present

## 2020-09-19 ENCOUNTER — Ambulatory Visit: Payer: Medicare PPO | Admitting: Neurology

## 2020-09-19 ENCOUNTER — Other Ambulatory Visit: Payer: Self-pay

## 2020-09-19 ENCOUNTER — Encounter: Payer: Self-pay | Admitting: Neurology

## 2020-09-19 VITALS — BP 191/94 | HR 93 | Ht 69.0 in | Wt 118.8 lb

## 2020-09-19 DIAGNOSIS — F0280 Dementia in other diseases classified elsewhere without behavioral disturbance: Secondary | ICD-10-CM | POA: Diagnosis not present

## 2020-09-19 DIAGNOSIS — R413 Other amnesia: Secondary | ICD-10-CM

## 2020-09-19 DIAGNOSIS — G309 Alzheimer's disease, unspecified: Secondary | ICD-10-CM | POA: Diagnosis not present
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
## 2020-09-19 NOTE — Patient Instructions (Signed)
1. Schedule MRI brain without contrast at [facility]  2. For the driving assessment, you can contact [employer] in [place] or [employer] [phone]  3. Follow-up with Dr. Lajuana Ripple as scheduled   FALL PRECAUTIONS: Be cautious when walking. Scan the area for obstacles that may increase the risk of trips and falls. When getting up in the mornings, sit up at the edge of the bed for a few minutes before getting out of bed. Consider elevating the bed at the head end to avoid drop of blood pressure when getting up. Walk always in a well-lit room (use night lights in the walls). Avoid area rugs or power cords from appliances in the middle of the walkways. Use a walker or a cane if necessary and consider physical therapy for balance exercise. Get your eyesight checked regularly.  FINANCIAL OVERSIGHT: Supervision, especially oversight when making financial decisions or transactions is also recommended as difficulties arise.  HOME SAFETY: Consider the safety of the kitchen when operating appliances like stoves, microwave oven, and blender. Consider having supervision and share cooking responsibilities until no longer able to participate in those. Accidents with firearms and other hazards in the house should be identified and addressed as well.  DRIVING: Regarding driving, in patients with progressive memory problems, driving will be impaired. We advise to have someone else do the driving if trouble finding directions or if minor accidents are reported. Independent driving assessment is available to determine safety of driving.  ABILITY TO BE LEFT ALONE: If patient is unable to contact 911 operator, consider using LifeLine, or when the need is there, arrange for someone to stay with patients. Smoking is a fire hazard, consider supervision or cessation. Risk of wandering should be assessed by caregiver and if detected at any point, supervision and safe proof  recommendations should be instituted.  MEDICATION SUPERVISION: Inability to self-administer medication needs to be constantly addressed. Implement a mechanism to ensure safe administration of the medications.  RECOMMENDATIONS FOR ALL PATIENTS WITH MEMORY PROBLEMS: 1. Continue to exercise (Recommend 30 minutes of walking everyday, or 3 hours every week) 2. Increase social interactions - continue going to [place] and enjoy social gatherings with friends and family 3. Eat healthy, avoid fried foods and eat more fruits and vegetables 4. Maintain adequate blood pressure, blood sugar, and blood cholesterol level. Reducing the risk of stroke and cardiovascular disease also helps promoting better memory. 5. Avoid stressful situations. Live a simple life and avoid aggravations. Organize your time and prepare for the next day in anticipation. 6. Sleep well, avoid any interruptions of sleep and avoid any distractions in the bedroom that may interfere with adequate sleep quality 7. Avoid sugar, avoid sweets as there is a strong link between excessive sugar intake, diabetes, and cognitive impairment The Mediterranean diet has been shown to help patients reduce the risk of progressive memory disorders and reduces cardiovascular risk. This includes eating fish, eat fruits and green leafy vegetables, nuts like almonds and hazelnuts, walnuts, and also use olive oil. Avoid fast foods and fried foods as much as possible. Avoid sweets and sugar as sugar use has been linked to worsening of memory function.  There is always a concern of gradual progression of memory problems. If this is the case, then we may need to adjust level of care according to patient needs. Support, both to the patient and caregiver, should then be put into place.

## 2020-09-20 ENCOUNTER — Telehealth: Payer: Self-pay

## 2020-09-20 NOTE — Telephone Encounter (Signed)
Made patient tele visit for tomorrow she was very upset that we would not order a urine and call something in. Tried to explain things and she just kept saying I can not believe what things have come to.

## 2020-09-20 NOTE — Telephone Encounter (Signed)
Pt called informed that MRI scheduled for Nov 4th at [facility] she has to be at the hospital at 12:30 scan is at 1pm she stated that she wrote the information down,  Spoke to pt daughter and informed her of the MRI and she is going to let her brother know,

## 2020-09-21 ENCOUNTER — Encounter (HOSPITAL_COMMUNITY)
Admission: RE | Admit: 2020-09-21 | Discharge: 2020-09-21 | Disposition: A | Discharge status: Home / Self Care | Payer: Medicare PPO | Source: Ambulatory Visit | Attending: Nephrology | Admitting: Nephrology

## 2020-09-21 ENCOUNTER — Other Ambulatory Visit: Payer: Self-pay

## 2020-09-21 ENCOUNTER — Ambulatory Visit (INDEPENDENT_AMBULATORY_CARE_PROVIDER_SITE_OTHER): Payer: Medicare PPO | Admitting: Family Medicine

## 2020-09-21 ENCOUNTER — Encounter (HOSPITAL_COMMUNITY): Payer: Self-pay

## 2020-09-21 DIAGNOSIS — N3000 Acute cystitis without hematuria: Secondary | ICD-10-CM | POA: Diagnosis not present

## 2020-09-21 DIAGNOSIS — N1832 Chronic kidney disease, stage 3b: Secondary | ICD-10-CM | POA: Diagnosis not present

## 2020-09-21 DIAGNOSIS — R350 Frequency of micturition: Secondary | ICD-10-CM | POA: Diagnosis not present

## 2020-09-21 LAB — URINALYSIS, COMPLETE
Bilirubin, UA: NEGATIVE
Ketones, UA: NEGATIVE
Nitrite, UA: NEGATIVE
Specific Gravity, UA: 1.025 (ref 1.005–1.030)
Urobilinogen, Ur: 0.2 mg/dL (ref 0.2–1.0)
pH, UA: 6 (ref 5.0–7.5)

## 2020-09-21 LAB — MICROSCOPIC EXAMINATION: WBC, UA: >30 /hpf — AB (ref 0–5)

## 2020-09-21 LAB — POCT HEMOGLOBIN-HEMACUE: Hemoglobin: 9.9 g/dL — ABNORMAL LOW (ref 12.0–15.0)

## 2020-09-21 MED ORDER — EPOETIN ALFA 3000 UNIT/ML IJ SOLN
INTRAMUSCULAR | Status: AC
  Filled 2020-09-21: qty 1

## 2020-09-21 MED ORDER — EPOETIN ALFA 3000 UNIT/ML IJ SOLN
3000.0000 [IU] | Freq: Once | INTRAMUSCULAR | Status: AC
  Administered 2020-09-21: 3000 [IU] via SUBCUTANEOUS

## 2020-09-21 MED ORDER — EPOETIN ALFA-EPBX 3000 UNIT/ML IJ SOLN: 3000.0000 [IU] | Freq: Once | INTRAMUSCULAR | Status: DC

## 2020-09-21 MED ORDER — CEPHALEXIN 500 MG PO CAPS: 500.0000 mg | ORAL_CAPSULE | Freq: Two times a day (BID) | ORAL | 0 refills | Status: AC

## 2020-09-21 NOTE — Progress Notes (Signed)
Telephone visit  Subjective: CC: UTI PCP: Janora Norlander, DO GYI:RSWNIO Wendy [name] is a 79 y.o. female calls for telephone consult today. Patient provides verbal consent for consult held via phone.  Due to COVID-19 pandemic this visit was conducted virtually. This visit type was conducted due to national recommendations for restrictions regarding the COVID-19 Pandemic (e.g. social distancing, sheltering in place) in an effort to limit this patient's exposure and mitigate transmission in our community. All issues noted in this document were discussed and addressed.  A physical exam was not performed with this format.   Location of patient: home Location of provider: [facility] Others present for call: none  1. Urinary symptoms Patient reports a few week h/o urinary frequency, urgency.  She has incontinence.  She uses pull up underwear. Denies hematuria, fevers, chills, abdominal pain, nausea, vomiting, back pain, vaginal discharge.  Patient has used nothing for symptoms.  Patient denies a h/o frequent or recurrent UTIs.    ROS: Per HPI  Allergies  Allergen Reactions   Codeine    Erythromycin    Leflunomide Other (See Comments)   Lisinopril    Naproxen    Penicillins    Pioglitazone    Valsartan Other (See Comments)   Past Medical History:  Diagnosis Date   Acquired hammer toes of both feet    Anemia    Anxiety    Arthritis    Chronic kidney disease    Diabetes mellitus without complication (H[facility])    Eczema    Environmental allergies    Fibrocystic breast changes    GERD (gastroesophageal reflux disease)    Hearing loss    Hypercholesteremia    Hypertension    IBS (irritable bowel syndrome)    Incontinence    Memory loss    Osteoarthritis    PVD (peripheral vascular disease) ([facility])    Rheumatoid arthritis ([place])     Current Outpatient Medications:    aspirin EC 81 MG tablet, Take 81 mg by mouth daily., Disp: , Rfl:    cilostazol (PLETAL) 100 MG  tablet, Take 1 tablet (100 mg total) by mouth daily., Disp: 90 tablet, Rfl: 3   cloNIDine (CATAPRES - DOSED IN MG/24 HR) 0.2 mg/24hr patch, Place 1 patch (0.2 mg total) onto the skin once a week., Disp: 4 patch, Rfl: 0   cycloSPORINE (RESTASIS) 0.05 % ophthalmic emulsion, Place 1 drop into both eyes 2 (two) times daily., Disp: 0.4 mL, Rfl: 5   diclofenac Sodium (VOLTAREN) 1 % GEL, Apply 2 g topically daily., Disp: , Rfl:    epoetin alfa-epbx (RETACRIT) 3000 UNIT/ML injection, 3,000 Units every 14 (fourteen) days., Disp: , Rfl:    folic acid (FOLVITE) 1 MG tablet, Take 1 tablet (1 mg total) by mouth daily., Disp: 90 tablet, Rfl: 3   hydrochlorothiazide (HYDRODIURIL) 12.5 MG tablet, Take 1 tablet (12.5 mg total) by mouth daily., Disp: 30 tablet, Rfl: 4   meclizine (ANTIVERT) 25 MG tablet, Take 1 tablet (25 mg total) by mouth 3 (three) times daily as needed for dizziness., Disp: 30 tablet, Rfl: 0   memantine (NAMENDA) 10 MG tablet, Take 1 tablet (10 mg total) by mouth in the morning and at bedtime., Disp: 90 tablet, Rfl: 3   metFORMIN (GLUCOPHAGE) 1000 MG tablet, Take 0.5 tablets (500 mg total) by mouth 2 (two) times daily with a meal. Renal dose, Disp: 180 tablet, Rfl: 1   Omega-3 Fatty Acids (FISH OIL) 1000 MG CAPS, Take 1,000 mg by mouth daily., Disp: ,  Rfl:    rivastigmine (EXELON) 9.5 mg/24hr, APPLY 1 PATCH TOPICALLY ONCE DAILY, Disp: 30 patch, Rfl: 2   senna (SENOKOT) 8.6 MG tablet, Take 1 tablet by mouth daily., Disp: , Rfl:   Assessment/ Plan: 79 y.o. female   1.  Acute cystitis without hematuria Overactive bladder versus urinary tract infection.  Will check urinalysis and make plans after that.  She will try and bring a urine sample by later this afternoon. - Urinalysis, Complete  **Addendum Urinalysis showed trace lysed blood, leukocytes, her urine microscopy showed greater than 30 white blood cells and many bacteria.  No red blood cells were appreciated.  I have sent in  Keflex 500 mg twice daily.  She is previously tolerated cephalosporins.  We discussed home care instructions and reasons for return.  She voiced understanding of follow-up as needed   Start time: 11:57am; results called back with plan at 5:24pm End time: 12:03pm; phone call concluded again at 5:26pm  Total time spent on patient care (including telephone call/ virtual visit): 8 minutes  [place], [place] [phone]

## 2020-09-27 ENCOUNTER — Telehealth: Payer: Self-pay

## 2020-09-27 ENCOUNTER — Other Ambulatory Visit: Payer: Self-pay | Admitting: Family Medicine

## 2020-09-27 DIAGNOSIS — R42 Dizziness and giddiness: Secondary | ICD-10-CM

## 2020-09-27 NOTE — Telephone Encounter (Signed)
Pt called to let Dr Lajuana Ripple know that for the past month her blood sugar has been very high (in the 200's). Wants to know what she needs to do or if there is something Dr Lajuana Ripple can prescribe to her to help.

## 2020-09-27 NOTE — Telephone Encounter (Signed)
Patient would like for billing to call her first to see how much it will be to see [name]- please call patient

## 2020-09-27 NOTE — Telephone Encounter (Signed)
Please set her up with julie.  She needs medication that is not harsh on kidneys

## 2020-09-28 NOTE — Telephone Encounter (Signed)
Spoke with patient to let her know her cost to see [name] would be $20 copay and she stated she has an appt in December to see a dietician and she will see her instead.

## 2020-09-29 ENCOUNTER — Encounter (HOSPITAL_COMMUNITY): Payer: Self-pay

## 2020-09-29 ENCOUNTER — Other Ambulatory Visit: Payer: Self-pay | Admitting: Neurology

## 2020-09-29 ENCOUNTER — Other Ambulatory Visit: Payer: Self-pay

## 2020-09-29 ENCOUNTER — Ambulatory Visit (HOSPITAL_COMMUNITY)
Admission: RE | Admit: 2020-09-29 | Discharge: 2020-09-29 | Disposition: A | Discharge status: Home / Self Care | Payer: Medicare PPO | Source: Ambulatory Visit | Attending: Neurology | Admitting: Neurology

## 2020-09-29 DIAGNOSIS — R413 Other amnesia: Secondary | ICD-10-CM

## 2020-10-05 ENCOUNTER — Encounter (HOSPITAL_COMMUNITY): Admission: RE | Admit: 2020-10-05 | Payer: Medicare PPO | Source: Ambulatory Visit

## 2020-10-05 ENCOUNTER — Ambulatory Visit: Payer: Medicare PPO | Admitting: *Deleted

## 2020-10-05 ENCOUNTER — Encounter (HOSPITAL_COMMUNITY): Payer: Medicare PPO

## 2020-10-05 DIAGNOSIS — E1122 Type 2 diabetes mellitus with diabetic chronic kidney disease: Secondary | ICD-10-CM

## 2020-10-05 DIAGNOSIS — I10 Essential (primary) hypertension: Secondary | ICD-10-CM

## 2020-10-05 DIAGNOSIS — R600 Localized edema: Secondary | ICD-10-CM

## 2020-10-05 DIAGNOSIS — E1165 Type 2 diabetes mellitus with hyperglycemia: Secondary | ICD-10-CM

## 2020-10-05 DIAGNOSIS — R413 Other amnesia: Secondary | ICD-10-CM

## 2020-10-07 ENCOUNTER — Encounter (HOSPITAL_COMMUNITY)
Admission: RE | Admit: 2020-10-07 | Discharge: 2020-10-07 | Disposition: A | Discharge status: Home / Self Care | Payer: Medicare PPO | Source: Ambulatory Visit | Attending: Nephrology | Admitting: Nephrology

## 2020-10-07 ENCOUNTER — Other Ambulatory Visit: Payer: Self-pay

## 2020-10-07 ENCOUNTER — Telehealth: Payer: Medicare PPO

## 2020-10-07 ENCOUNTER — Encounter (HOSPITAL_COMMUNITY): Payer: Self-pay

## 2020-10-07 DIAGNOSIS — N183 Chronic kidney disease, stage 3 unspecified: Secondary | ICD-10-CM | POA: Diagnosis present

## 2020-10-07 DIAGNOSIS — D631 Anemia in chronic kidney disease: Secondary | ICD-10-CM | POA: Diagnosis not present

## 2020-10-07 LAB — POCT HEMOGLOBIN-HEMACUE: Hemoglobin: 9.6 g/dL — ABNORMAL LOW (ref 12.0–15.0)

## 2020-10-07 MED ORDER — EPOETIN ALFA-EPBX 3000 UNIT/ML IJ SOLN
3000.0000 [IU] | Freq: Once | INTRAMUSCULAR | Status: AC
  Administered 2020-10-07: 3000 [IU] via SUBCUTANEOUS

## 2020-10-07 MED ORDER — EPOETIN ALFA-EPBX 3000 UNIT/ML IJ SOLN
INTRAMUSCULAR | Status: AC
  Filled 2020-10-07: qty 1

## 2020-10-07 NOTE — Chronic Care Management (AMB) (Addendum)
Chronic Care Management   Initial Visit Note  10/05/2020 Name: MATSUKO KRETZ MRN: [id number] DOB: 1940/12/26  Referred by: Janora Norlander, DO Reason for referral : Chronic Care Management (Initial Visit)   JAVIONNA LEDER is a 79 y.o. year old female who is a primary care patient of Janora Norlander, DO. The CCM team was consulted for assistance with chronic disease management and care coordination needs related to HTN, HLD, PVD, CKD stage 3, DM, RA, memory loss.  Review of patient status, including review of consultants reports, relevant laboratory and other test results, and collaboration with appropriate care team members and the patient's provider was performed as part of comprehensive patient evaluation and provision of chronic care management services.    Subjective: I spoke with Ms Lufkin by telephone today regarding management of her chronic medical conditions.   SDOH (Social Determinants of Health) assessments performed: Yes See Care Plan activities for detailed interventions related to SDOH  SDOH Interventions     Most Recent Value  SDOH Interventions  Physical Activity Interventions Other (Comments)  [[employer] [facility]]       Objective: Outpatient Encounter Medications as of 10/05/2020  Medication Sig   aspirin EC 81 MG tablet Take 81 mg by mouth daily.   cilostazol (PLETAL) 100 MG tablet Take 1 tablet (100 mg total) by mouth daily.   cloNIDine (CATAPRES - DOSED IN MG/24 HR) 0.2 mg/24hr patch Place 1 patch (0.2 mg total) onto the skin once a week.   cycloSPORINE (RESTASIS) 0.05 % ophthalmic emulsion Place 1 drop into both eyes 2 (two) times daily.   diclofenac Sodium (VOLTAREN) 1 % GEL Apply 2 g topically daily.   epoetin alfa-epbx (RETACRIT) 3000 UNIT/ML injection 3,000 Units every 14 (fourteen) days.   folic acid (FOLVITE) 1 MG tablet Take 1 tablet (1 mg total) by mouth daily.   hydrochlorothiazide (HYDRODIURIL) 12.5 MG tablet Take 1  tablet (12.5 mg total) by mouth daily.   meclizine (ANTIVERT) 25 MG tablet TAKE 1 TABLET BY MOUTH THREE TIMES DAILY AS NEEDED FOR DIZZINESS (Patient not taking: Reported on 10/05/2020)   memantine (NAMENDA) 10 MG tablet Take 1 tablet (10 mg total) by mouth in the morning and at bedtime.   metFORMIN (GLUCOPHAGE) 1000 MG tablet Take 0.5 tablets (500 mg total) by mouth 2 (two) times daily with a meal. Renal dose   Omega-3 Fatty Acids (FISH OIL) 1000 MG CAPS Take 1,000 mg by mouth daily.   rivastigmine (EXELON) 9.5 mg/24hr APPLY 1 PATCH TOPICALLY ONCE DAILY   senna (SENOKOT) 8.6 MG tablet Take 1 tablet by mouth daily.   No facility-administered encounter medications on file as of 10/05/2020.     BP Readings from Last 3 Encounters:  10/07/20 (!) 158/89  09/21/20 (!) 180/89  09/19/20 (!) 191/94   Lab Results  Component Value Date   HGBA1C 7.1 (H) 07/26/2020   HGBA1C 7.4 (H) 02/11/2020   Lab Results  Component Value Date   LDLCALC 96 02/11/2020   CREATININE 1.26 (H) 09/07/2020     Goals Addressed            This Visit's Progress    Chronic Disease Management Needs       CARE PLAN ENTRY (see longtitudinal plan of care for additional care plan information)  Current Barriers:   Chronic Disease Management support, education, and care coordination needs related to HTN, HLD, PVD, CKD stage 3, DM, RA, memory loss  Clinical Goal(s) related to HTN, HLD,  PVD, CKD stage 3, DM, RA, memory loss:  Over the next 30 days, patient will:   Work with the care management team to address educational, disease management, and care coordination needs   Begin or continue self health monitoring activities as directed today Measure and record cbg (blood glucose) 2 times daily and Measure and record blood pressure 4 times per week  Call provider office for new or worsened signs and symptoms Blood glucose findings outside established parameters and Blood pressure findings outside established  parameters  Call care management team with questions or concerns  Verbalize basic understanding of patient centered plan of care established today  Interventions related to HTN, HLD, PVD, CKD stage 3, DM, RA, memory loss:   Evaluation of current treatment plans and patient's adherence to plan as established by provider  Assessed patient understanding of disease states  Assessed patient's education and care coordination needs  Provided disease specific education to patient   Collaborated with appropriate clinical care team members regarding patient needs  Chart reviewed including recent office notes and lab results  Discussed recent PCP and nephrology visits  Reviewed and discussed medications. Had patient read her medication bottles to me.  Confirmed that she is taking HCTZ and not chlorthalidone.   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 She was told she had one but didn't understand what the results were and couldn't remember whey she had it done. After discussion, she remembered in great detail why she fell and went to the ED and ended up with a  CT scan of her head.   Fall evaluation and fall prevention performed  Discussed lower extremity edema o Wearing compresion hose o Takes HCTZ o Props feet up when sitting but can's it around all day. She likes to be up and moving around the house.   Discussed diabetes home blood sugar testing o Reports high readings for the past couple of months o Averaging around 150 but has been as high as 300 o Reports dry mouth, which could be a side effect of elev blood sugar o No known reason for elevation o Last A1C on 8/31 was 7.1  Discussed diet and nutrition o Tries to eat a low sodium diet and avoid simple carbs o No change in diet or weight recently  Reviewed upcoming appointments  Provided with RN Care Manager contact number and encouraged to reach out as needed  Patient Self Care Activities related to HTN, HLD, PVD, CKD stage 3, DM, RA, memory loss:   Patient is unable to independently self-manage chronic health conditions  Initial goal documentation         Plan:   Telephone follow  up appointment with care management team member scheduled for: RN Care Manager on 10/14/20 and LCSW on 10/18/20 Next PCP appointment scheduled for: 01/24/21 with Dr Lajuana Ripple  Chong Sicilian, BSN, RN-BC [facility] Direct Dial: [phone]

## 2020-10-07 NOTE — Patient Instructions (Signed)
Visit Information  Goals Addressed            This Visit's Progress   . Chronic Disease Management Needs       CARE PLAN ENTRY (see longtitudinal plan of care for additional care plan information)  Current Barriers:  . Chronic Disease Management support, education, and care coordination needs related to HTN, HLD, PVD, CKD stage 3, DM, RA, memory loss  Clinical Goal(s) related to HTN, HLD, PVD, CKD stage 3, DM, RA, memory loss:  Over the next 30 days, patient will:  . Work with the care management team to address educational, disease management, and care coordination needs  . Begin or continue self health monitoring activities as directed today Measure and record cbg (blood glucose) 2 times daily and Measure and record blood pressure 4 times per week . Call provider office for new or worsened signs and symptoms Blood glucose findings outside established parameters and Blood pressure findings outside established parameters . Call care management team with questions or concerns . Verbalize basic understanding of patient centered plan of care established today  Interventions related to HTN, HLD, PVD, CKD stage 3, DM, RA, memory loss:  . Evaluation of current treatment plans and patient's adherence to plan as established by provider . Assessed patient understanding of disease states . Assessed patient's education and care coordination needs . Provided disease specific education to patient  . Collaborated with appropriate clinical care team members regarding patient needs . Chart reviewed including recent office notes and lab results . Discussed recent PCP and nephrology visits . Reviewed and discussed medications. Had patient read her medication bottles to me. . Confirmed that she is taking HCTZ and not chlorthalidone.  . Staff message sent to nephrologist, Dr Theador Hawthorne to let him know that she is taking HCTZ and not chlorthalidone, which was recommended in his last office note . 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 Discussed physical activity and mobility o Not very physically active now but would like to begin exercise programs at the [facility] patient to reach out to them regarding their schedule . Discussed ability to perform ADLs . Discussed her unahppiness with not being able to drive due to memory problems o Has f/u with neuro and MRI scheduled . Discussed transportation assistance through [place] . Discussed CT scan results from 2015. She was told she had one but didn't understand what the results were and couldn't remember whey she had it done. After discussion, she remembered in great detail why she fell and went to the ED and ended up with a CT scan of her head.  . Fall evaluation and fall prevention performed . Discussed lower extremity edema o Wearing compresion hose o Takes HCTZ o Props feet up when sitting but can's it around all day. She likes to be up and moving around the house.  . Discussed diabetes home blood sugar testing o Reports high readings for the past couple of months o Averaging around 150 but has been as high as 300 o Reports dry mouth, which could be a side effect of elev blood sugar o No known reason for elevation o Last A1C on 8/31 was 7.1 . Discussed diet and nutrition o Tries to eat a low sodium  diet and avoid simple carbs o No change in diet or weight recently . Reviewed upcoming appointments . Provided with RN Care Manager contact number and encouraged to reach out as needed  Patient Self Care Activities related to HTN, HLD, PVD, CKD stage 3, DM, RA, memory loss:  . Patient is unable to independently self-manage chronic health conditions  Initial goal documentation        Ms.  Nishida was given information about Chronic Care Management services today including:  1. CCM service includes personalized support from designated clinical staff supervised by her physician, including individualized plan of care and coordination with other care providers 2. 24/7 contact phone numbers for assistance for urgent and routine care needs. 3. Service will only be billed when office clinical staff spend 20 minutes or more in a month to coordinate care. 4. Only one practitioner may furnish and bill the service in a calendar month. 5. The patient may stop CCM services at any time (effective at the end of the month) by phone call to the office staff. 6. The patient will be responsible for cost sharing (co-pay) of up to 20% of the service fee (after annual deductible is met).  Patient agreed to services and verbal consent obtained.   The patient verbalized understanding of instructions, educational materials, and care plan provided today and declined offer to receive copy of patient instructions, educational materials, and care plan.   Plan:  Telephone follow up appointment with care management team member scheduled for: RN Care Manager on 10/14/20 and LCSW on 10/18/20 Next PCP appointment scheduled for: 01/24/21 with Dr Lajuana Ripple  Chong Sicilian, BSN, RN-BC [place] [facility] Direct Dial: [phone]

## 2020-10-14 ENCOUNTER — Other Ambulatory Visit: Payer: Self-pay

## 2020-10-14 ENCOUNTER — Ambulatory Visit (HOSPITAL_COMMUNITY)
Admission: RE | Admit: 2020-10-14 | Discharge: 2020-10-14 | Disposition: A | Discharge status: Home / Self Care | Payer: Medicare PPO | Source: Ambulatory Visit | Attending: Neurology | Admitting: Neurology

## 2020-10-14 ENCOUNTER — Ambulatory Visit: Payer: Medicare PPO | Admitting: *Deleted

## 2020-10-14 DIAGNOSIS — E1165 Type 2 diabetes mellitus with hyperglycemia: Secondary | ICD-10-CM

## 2020-10-14 DIAGNOSIS — G9389 Other specified disorders of brain: Secondary | ICD-10-CM | POA: Diagnosis not present

## 2020-10-14 DIAGNOSIS — R413 Other amnesia: Secondary | ICD-10-CM | POA: Insufficient documentation

## 2020-10-14 DIAGNOSIS — N183 Chronic kidney disease, stage 3 unspecified: Secondary | ICD-10-CM

## 2020-10-14 DIAGNOSIS — I6389 Other cerebral infarction: Secondary | ICD-10-CM | POA: Diagnosis not present

## 2020-10-14 DIAGNOSIS — I6782 Cerebral ischemia: Secondary | ICD-10-CM | POA: Diagnosis not present

## 2020-10-14 DIAGNOSIS — E1122 Type 2 diabetes mellitus with diabetic chronic kidney disease: Secondary | ICD-10-CM

## 2020-10-14 NOTE — Patient Instructions (Signed)
Visit Information  Goals Addressed            This Visit's Progress   . Chronic Disease Management Needs       CARE PLAN ENTRY (see longtitudinal plan of care for additional care plan information)  Current Barriers:  . Chronic Disease Management support, education, and care coordination needs related to HTN, HLD, PVD, CKD stage 3, DM, RA, memory loss  Clinical Goal(s) related to HTN, HLD, PVD, CKD stage 3, DM, RA, memory loss:  Over the next 30 days, patient will:  . Work with the care management team to address educational, disease management, and care coordination needs  . Begin or continue self health monitoring activities as directed today Measure and record cbg (blood glucose) 2 times daily and Measure and record blood pressure 4 times per week . Call provider office for new or worsened signs and symptoms Blood glucose findings outside established parameters and Blood pressure findings outside established parameters . Call care management team with questions or concerns . Verbalize basic understanding of patient centered plan of care established today  Interventions related to HTN, HLD, PVD, CKD stage 3, DM, RA, memory loss:  . Evaluation of current treatment plans and patient's adherence to plan as established by provider . Assessed patient understanding of disease states . Assessed patient's education and care coordination needs . Provided disease specific education to patient  . Collaborated with appropriate clinical care team members regarding patient needs . Chart reviewed including recent office notes and lab results . Discussed recent PCP and nephrology visits . Previously reviewed and discussed medications and had patient read her medication bottles to me. . Previously confirmed that she is taking HCTZ and not chlorthalidone.  Marland Kitchen Collaborated with Dr Theador Hawthorne and Dr Lajuana Ripple regarding diuretic. Dr Theador Hawthorne prefers chlorthalidone but said that HCTZ is ok if that is what  she has at home . 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 Discussed physical activity and mobility o Not very physically active now but would like to begin exercise programs at the [facility] mail schedule and program information for [employer] . Discussed her unahppiness with not being able to drive due to memory problems o Has f/u with neuro  o MRI done this morning . Discussed that Dr Delice Lesch had recommended an Independent Driving Assessment through either the [employer] in [place] [phone] or [employer] in [place] . Discussed transportation assistance through [place] . Reviewed upcoming appointments o Encouraged patient to reach out to CCM team is she needs assistance with transportation to these appointments . Provided with RN Care Manager contact number and encouraged to reach out as needed  Patient Self Care Activities related to HTN, HLD, PVD, CKD stage 3, DM, RA, memory loss:  . Patient is unable to independently self-manage chronic health conditions  Please see past updates related to this goal by clicking on the "Past Updates" button in the selected goal         The patient verbalized understanding of instructions, educational materials, and care plan provided today and declined offer to receive copy of patient instructions, educational materials, and care plan.   Follow-up PlanPlan:  Telephone follow up appointment with care management team member scheduled for:  LCSW on 10/18/20 and [place] on 10/27/2020  Chong Sicilian, BSN, RN-BC [place] [facility] Direct Dial: [phone]    '

## 2020-10-14 NOTE — Chronic Care Management (AMB) (Signed)
Chronic Care Management   Follow Up Note   10/14/2020 Name: Wendy [name] MRN: [id number] DOB: 02-19-1941  Referred by: Janora Norlander, DO Reason for referral : Chronic Care Management (RN follow up)   Wendy [name] is a 79 y.o. year old female who is a primary care patient of Janora Norlander, DO. The CCM team was consulted for assistance with chronic disease management and care coordination needs.    Review of patient status, including review of consultants reports, relevant laboratory and other test results, and collaboration with appropriate care team members and the patient's provider was performed as part of comprehensive patient evaluation and provision of chronic care management services.    SDOH (Social Determinants of Health) assessments performed: Yes See Care Plan activities for detailed interventions related to [facility])     Outpatient Encounter Medications as of 10/14/2020  Medication Sig  . aspirin EC 81 MG tablet Take 81 mg by mouth daily.  . cilostazol (PLETAL) 100 MG tablet Take 1 tablet (100 mg total) by mouth daily.  . cloNIDine (CATAPRES - DOSED IN MG/24 HR) 0.2 mg/24hr patch Place 1 patch (0.2 mg total) onto the skin once a week.  . cycloSPORINE (RESTASIS) 0.05 % ophthalmic emulsion Place 1 drop into both eyes 2 (two) times daily.  . diclofenac Sodium (VOLTAREN) 1 % GEL Apply 2 g topically daily.  Marland Kitchen epoetin alfa-epbx (RETACRIT) 3000 UNIT/ML injection 3,000 Units every 14 (fourteen) days.  . folic acid (FOLVITE) 1 MG tablet Take 1 tablet (1 mg total) by mouth daily.  . hydrochlorothiazide (HYDRODIURIL) 12.5 MG tablet Take 1 tablet (12.5 mg total) by mouth daily.  . meclizine (ANTIVERT) 25 MG tablet TAKE 1 TABLET BY MOUTH THREE TIMES DAILY AS NEEDED FOR DIZZINESS (Patient not taking: Reported on 10/05/2020)  . memantine (NAMENDA) 10 MG tablet Take 1 tablet (10 mg total) by mouth in the morning and at bedtime.  . metFORMIN (GLUCOPHAGE) 1000 MG tablet Take 0.5  tablets (500 mg total) by mouth 2 (two) times daily with a meal. Renal dose  . Omega-3 Fatty Acids (FISH OIL) 1000 MG CAPS Take 1,000 mg by mouth daily.  . rivastigmine (EXELON) 9.5 mg/24hr APPLY 1 PATCH TOPICALLY ONCE DAILY  . senna (SENOKOT) 8.6 MG tablet Take 1 tablet by mouth daily.   No facility-administered encounter medications on file as of 10/14/2020.      Goals Addressed            This Visit's Progress   . Chronic Disease Management Needs       CARE PLAN ENTRY (see longtitudinal plan of care for additional care plan information)  Current Barriers:  . Chronic Disease Management support, education, and care coordination needs related to HTN, HLD, PVD, CKD stage 3, DM, RA, memory loss  Clinical Goal(s) related to HTN, HLD, PVD, CKD stage 3, DM, RA, memory loss:  Over the next 30 days, patient will:  . Work with the care management team to address educational, disease management, and care coordination needs  . Begin or continue self health monitoring activities as directed today Measure and record cbg (blood glucose) 2 times daily and Measure and record blood pressure 4 times per week . Call provider office for new or worsened signs and symptoms Blood glucose findings outside established parameters and Blood pressure findings outside established parameters . Call care management team with questions or concerns . Verbalize basic understanding of patient centered plan of care established today  Interventions related to HTN,  HLD, PVD, CKD stage 3, DM, RA, memory loss:  . Evaluation of current treatment plans and patient's adherence to plan as established by provider . Assessed patient understanding of disease states . Assessed patient's education and care coordination needs . Provided disease specific education to patient  . Collaborated with appropriate clinical care team members regarding patient needs . Chart reviewed including recent office notes and lab  results . Discussed recent PCP and nephrology visits . Previously reviewed and discussed medications and had patient read her medication bottles to me. . Previously confirmed that she is taking HCTZ and not chlorthalidone.  Marland Kitchen Collaborated with Dr Theador Hawthorne and Dr Lajuana Ripple regarding diuretic. Dr Theador Hawthorne prefers chlorthalidone but said that HCTZ is ok if that is what she has at home . 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 Discussed physical activity and mobility o Not very physically active now but would like to begin exercise programs at the [facility] mail schedule and program information for [employer] . Discussed her unahppiness with not being able to drive due to memory problems o Has f/u with neuro  o MRI done this morning . Discussed that Dr Delice Lesch had recommended an Independent Driving Assessment through either the [employer] in [place] [phone] or [employer] in [place] . Discussed transportation assistance through [place] . Reviewed upcoming appointments o Encouraged patient to reach out to CCM team is she needs assistance with transportation to these appointments . Provided with RN Care Manager contact number and encouraged to reach out as needed  Patient Self Care Activities related to HTN, HLD, PVD, CKD stage 3, DM, RA, memory loss:  . Patient is unable to independently self-manage chronic health conditions  Please see past updates related to this goal by clicking on the "Past Updates" button in the selected goal           Plan:   Telephone follow up appointment with care management team member scheduled for: LCSW on 10/18/20 and [place] on 10/27/2020  Chong Sicilian, BSN, RN-BC [place] [facility] Direct Dial: [phone]    '

## 2020-10-17 ENCOUNTER — Telehealth: Payer: Self-pay

## 2020-10-17 NOTE — Telephone Encounter (Signed)
Pt called back no answer left a voice mail for pt to call back

## 2020-10-17 NOTE — Telephone Encounter (Signed)
-----   Message from Cameron Sprang, MD sent at 10/16/2020 10:38 PM EST ----- Pls let patient/son know that the MRI brain showed moderate diffuse atrophy (brain shrinkage), consistent with diagnosis of dementia that we had discussed. Thanks

## 2020-10-18 ENCOUNTER — Ambulatory Visit: Payer: Medicare PPO | Admitting: Licensed Clinical Social Worker

## 2020-10-18 DIAGNOSIS — E1122 Type 2 diabetes mellitus with diabetic chronic kidney disease: Secondary | ICD-10-CM

## 2020-10-18 DIAGNOSIS — I10 Essential (primary) hypertension: Secondary | ICD-10-CM

## 2020-10-18 DIAGNOSIS — E1165 Type 2 diabetes mellitus with hyperglycemia: Secondary | ICD-10-CM

## 2020-10-18 DIAGNOSIS — N183 Chronic kidney disease, stage 3 unspecified: Secondary | ICD-10-CM

## 2020-10-18 DIAGNOSIS — R413 Other amnesia: Secondary | ICD-10-CM

## 2020-10-18 DIAGNOSIS — I739 Peripheral vascular disease, unspecified: Secondary | ICD-10-CM

## 2020-10-18 NOTE — Chronic Care Management (AMB) (Signed)
Chronic Care Management    Clinical Social Work Follow Up Note  10/18/2020 Name: Wendy [name] MRN: [id number] DOB: [birth year]  Wendy [name] is a 79 y.o. year old female who is a primary care patient of Janora Norlander, DO. The CCM team was consulted for assistance with [employer] .   Review of patient status, including review of consultants reports, other relevant assessments, and collaboration with appropriate care team members and the patient's provider was performed as part of comprehensive patient evaluation and provision of chronic care management services.    SDOH (Social Determinants of Health) assessments performed: Yes; risk for depression; risk for physical inactivity; risk for financial needs. Risk for transport needs  SDOH Interventions     Most Recent Value  SDOH Interventions  Depression Interventions/Treatment  Medication         Chronic Care Management from 10/18/2020 in [facility]  PHQ-9 Total Score 7      GAD 7 : Generalized Anxiety Score 10/18/2020  Nervous, Anxious, on Edge 1  Control/stop worrying 1  Worry too much - different things 1  Trouble relaxing 0  Restless 0  Easily annoyed or irritable 0  Afraid - awful might happen 1  Total GAD 7 Score 4  Anxiety Difficulty Somewhat difficult    Outpatient Encounter Medications as of 10/18/2020  Medication Sig  . aspirin EC 81 MG tablet Take 81 mg by mouth daily.  . cilostazol (PLETAL) 100 MG tablet Take 1 tablet (100 mg total) by mouth daily.  . cloNIDine (CATAPRES - DOSED IN MG/24 HR) 0.2 mg/24hr patch Place 1 patch (0.2 mg total) onto the skin once a week.  . cycloSPORINE (RESTASIS) 0.05 % ophthalmic emulsion Place 1 drop into both eyes 2 (two) times daily.  . diclofenac Sodium (VOLTAREN) 1 % GEL Apply 2 g topically daily.  Marland Kitchen epoetin alfa-epbx (RETACRIT) 3000 UNIT/ML injection 3,000 Units every 14 (fourteen) days.  . folic acid (FOLVITE) 1 MG tablet Take 1 tablet  (1 mg total) by mouth daily.  . hydrochlorothiazide (HYDRODIURIL) 12.5 MG tablet Take 1 tablet (12.5 mg total) by mouth daily.  . meclizine (ANTIVERT) 25 MG tablet TAKE 1 TABLET BY MOUTH THREE TIMES DAILY AS NEEDED FOR DIZZINESS (Patient not taking: Reported on 10/05/2020)  . memantine (NAMENDA) 10 MG tablet Take 1 tablet (10 mg total) by mouth in the morning and at bedtime.  . metFORMIN (GLUCOPHAGE) 1000 MG tablet Take 0.5 tablets (500 mg total) by mouth 2 (two) times daily with a meal. Renal dose  . Omega-3 Fatty Acids (FISH OIL) 1000 MG CAPS Take 1,000 mg by mouth daily.  . rivastigmine (EXELON) 9.5 mg/24hr APPLY 1 PATCH TOPICALLY ONCE DAILY  . senna (SENOKOT) 8.6 MG tablet Take 1 tablet by mouth daily.   No facility-administered encounter medications on file as of 10/18/2020.    Goals Addressed              This Visit's Progress   .  Client will talk with LCSW in next 30 days to discuss management of medical needs of client (pt-stated)        CARE PLAN ENTRY   Current Barriers:  . Patient with chronic diagnoses of Type 2 DM, HTN, Dysphagia, PVD, Memory loss, CKD  Clinical Social Work Clinical Goal(s):  Marland Kitchen LCSW will call client in next 30 days to discuss client management of medical needs of client  Interventions: . Talked with client about edema issues . Talked with client  about blood sugar management . Talked with client about mobility issues  . Talked with client about pain issues of client . Talked with client about family support (support from her son) . Talked with client about upcoming client appointments . Talked with client about support from neurologist (Client sees Dr. Delice Lesch, neurologist) . Talked with client about medication procurement for client . Talked with client about transport needs of client . Talked with client about difficulty in standing . Talked with client about vision of client . Encouraged client to talk with RNCM as needed for nursing  support . Discussed [facility] transport services with client   Patient Self Care Activities:   Completes ADLs as needed Eats meals as needed Attends scheduled medical appointments  Patient Self Care Deficits:    Mobility issues Edema issues    Initial goal documentation       Follow Up Plan: LCSW to call client in next 4 weeks to discuss client management of health needs of client  Norva Riffle.Jasenia Weilbacher MSW, LCSW Licensed Clinical Social Worker [facility] Care Management [phone]

## 2020-10-18 NOTE — Patient Instructions (Addendum)
Licensed Clinical Social Worker Visit Information  Goals we discussed today:  Goals Addressed              This Visit's Progress   .  Client will talk with LCSW in next 30 days to discuss management of medical needs of client (pt-stated)        CARE PLAN ENTRY   Current Barriers:  . Patient with chronic diagnoses of Type 2 DM, HTN, Dysphagia, PVD, Memory loss, CKD  Clinical Social Work Clinical Goal(s):  Marland Kitchen LCSW will call client in next 30 days to discuss client management of medical needs of client  Interventions: . Talked with client about edema issues . Talked with client about blood sugar management . Talked with client about mobility issues  . Talked with client about pain issues of client . Talked with client about family support (support from her son) . Talked with client about upcoming client appointments . Talked with client about support from neurologist (Client sees Dr. Delice Lesch, neurologist) . Talked with client about medication procurement for client . Talked with client about transport needs of client . Talked with client about difficulty in standing . Talked with client about vision of client . Encouraged client to talk with RNCM as needed for nursing support . Discussed [facility] transport services with client   Patient Self Care Activities:   Completes ADLs as needed Eats meals as needed Attends scheduled medical appointments  Patient Self Care Deficits:    Mobility issues Edema issues  Initial goal documentation       Materials Provided: No  Follow Up Plan:  LCSW to call client in next 4 weeks to discuss client management of health needs of client  The patient verbalized understanding of instructions provided today and declined a print copy of patient instruction materials.    Norva Riffle.Talyn Eddie MSW, LCSW Licensed Clinical Social Worker [facility] Care [facility] [phone]

## 2020-10-19 ENCOUNTER — Telehealth: Payer: Self-pay

## 2020-10-19 DIAGNOSIS — R2689 Other abnormalities of gait and mobility: Secondary | ICD-10-CM | POA: Diagnosis not present

## 2020-10-19 NOTE — Telephone Encounter (Signed)
Spoke to patient. She said she was talking to a case worker at [employer]. Was told that her scan in 2015 showed age-related changes. Discussed diffuse atrophy more than last brain scan in 2015. Asking why she is not able to drive when other 79 year old people can drive. She has minimal insight into her condition and wanted to argue about driving. I discussed with her to take this up with the [facility] and she can discuss further with her PCP if she wishes but my recommendation is no further driving.

## 2020-10-19 NOTE — Telephone Encounter (Signed)
Called Wendy [name] and informed her that  MRI brain showed moderate diffuse atrophy (brain shrinkage), consistent with diagnosis of dementia that we had discussed. Wendy [name] is asking that Dr Delice Lesch call her she does not understand when I try to go over results with her. Wendy [name] stated that she needs the Dr to talk to her and tell her what is going on and if her brain is smaller than it was before? She does not want the nurse to call her she said she deserves the Dr to call her!!

## 2020-10-19 NOTE — Telephone Encounter (Signed)
Pt called no answer left voice mail to call the office back

## 2020-10-19 NOTE — Telephone Encounter (Signed)
-----   Message from Cameron Sprang, MD sent at 10/16/2020 10:38 PM EST ----- Pls let patient/son know that the MRI brain showed moderate diffuse atrophy (brain shrinkage), consistent with diagnosis of dementia that we had discussed. Thanks

## 2020-10-24 ENCOUNTER — Other Ambulatory Visit: Payer: Self-pay

## 2020-10-24 ENCOUNTER — Encounter (HOSPITAL_COMMUNITY)
Admission: RE | Admit: 2020-10-24 | Discharge: 2020-10-24 | Disposition: A | Discharge status: Home / Self Care | Payer: Medicare PPO | Source: Ambulatory Visit | Attending: Nephrology | Admitting: Nephrology

## 2020-10-24 DIAGNOSIS — N183 Chronic kidney disease, stage 3 unspecified: Secondary | ICD-10-CM | POA: Diagnosis not present

## 2020-10-24 LAB — POCT HEMOGLOBIN-HEMACUE: Hemoglobin: 10.3 g/dL — ABNORMAL LOW (ref 12.0–15.0)

## 2020-10-27 ENCOUNTER — Ambulatory Visit: Payer: Medicare PPO | Admitting: *Deleted

## 2020-10-27 DIAGNOSIS — E1165 Type 2 diabetes mellitus with hyperglycemia: Secondary | ICD-10-CM

## 2020-10-27 DIAGNOSIS — I10 Essential (primary) hypertension: Secondary | ICD-10-CM

## 2020-10-27 DIAGNOSIS — R413 Other amnesia: Secondary | ICD-10-CM

## 2020-10-28 NOTE — Patient Instructions (Signed)
Visit Information  Goals Addressed            This Visit's Progress    Chronic Disease Management Needs       CARE PLAN ENTRY (see longtitudinal plan of care for additional care plan information)  Current Barriers:   Chronic Disease Management support, education, and care coordination needs related to HTN, HLD, PVD, CKD stage 3, DM, RA, memory loss  Clinical Goal(s) related to HTN, HLD, PVD, CKD stage 3, DM, RA, memory loss:  Over the next 30 days, patient will:   Work with the care management team to address educational, disease management, and care coordination needs   Call care management team with questions or concerns  Reach out to [facility] to schedule transportation as needed  Interventions related to HTN, HLD, PVD, CKD stage 3, DM, RA, memory loss:   Assessed patient's education and care coordination needs  Discussed transportation needs o Patient has been in contact with [facility] and has arranged for transportation to nutrition visit with Jearld Fenton o Discussed that [place] may be able to provide transportation to other places for necessary health maintenance with a 3 day notice  Reviewed and discussed upcoming appointment  Chart reviewed including recent office notes and lab results  Previously discussed family/social support o Son lives with her but works 2nd shift o 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 o Has utliized the [employer] in the past for socializattion and exercise  Discussed physical activity and mobility o Not very physically active now but would like to begin exercise programs at the [facility] schedule for December and program information for [employer]  Provided with Insurance claims handler number and encouraged to reach out as needed  Patient Self Care Activities related to HTN, HLD, PVD, CKD stage 3, DM, RA, memory loss:    Patient is unable to independently self-manage chronic health conditions  Please see past updates related to this goal by clicking on the "Past Updates" button in the selected goal         The patient verbalized understanding of instructions, educational materials, and care plan provided today and declined offer to receive copy of patient instructions, educational materials, and care plan.   Follow-up Plan:  Telephone follow up appointment with care management team member scheduled for: 11/21/20 with LCSW The patient has been provided with contact information for the care management team and has been advised to call with any health related questions or concerns.  Next PCP appointment scheduled for: 01/24/21 with Dr Lajuana Ripple Appointment with dietician, Jearld Fenton, scheduled for 11/02/20   Chong Sicilian, BSN, RN-BC [place] [facility] [place] Management Direct Dial: [phone]

## 2020-10-28 NOTE — Chronic Care Management (AMB) (Signed)
Chronic Care Management   Follow Up Note   10/27/2020 Name: Wendy [name] MRN: [id number] DOB: [birth year]  Referred by: Janora Norlander, DO Reason for referral : Chronic Care Management (RN Follow up)   Wendy [name] is a 79 y.o. year old female who is a primary care patient of Janora Norlander, DO. The CCM team was consulted for assistance with chronic disease management and care coordination needs.    Review of patient status, including review of consultants reports, relevant laboratory and other test results, and collaboration with appropriate care team members and the patient's provider was performed as part of comprehensive patient evaluation and provision of chronic care management services.    SDOH (Social Determinants of Health) assessments performed: Yes See Care Plan activities for detailed interventions related to [facility])     Outpatient Encounter Medications as of 10/27/2020  Medication Sig  . aspirin EC 81 MG tablet Take 81 mg by mouth daily.  . cilostazol (PLETAL) 100 MG tablet Take 1 tablet (100 mg total) by mouth daily.  . cloNIDine (CATAPRES - DOSED IN MG/24 HR) 0.2 mg/24hr patch Place 1 patch (0.2 mg total) onto the skin once a week.  . cycloSPORINE (RESTASIS) 0.05 % ophthalmic emulsion Place 1 drop into both eyes 2 (two) times daily.  . diclofenac Sodium (VOLTAREN) 1 % GEL Apply 2 g topically daily.  Marland Kitchen epoetin alfa-epbx (RETACRIT) 3000 UNIT/ML injection 3,000 Units every 14 (fourteen) days.  . folic acid (FOLVITE) 1 MG tablet Take 1 tablet (1 mg total) by mouth daily.  . hydrochlorothiazide (HYDRODIURIL) 12.5 MG tablet Take 1 tablet (12.5 mg total) by mouth daily.  . meclizine (ANTIVERT) 25 MG tablet TAKE 1 TABLET BY MOUTH THREE TIMES DAILY AS NEEDED FOR DIZZINESS (Patient not taking: Reported on 10/05/2020)  . memantine (NAMENDA) 10 MG tablet Take 1 tablet (10 mg total) by mouth in the morning and at bedtime.  . metFORMIN (GLUCOPHAGE) 1000 MG tablet Take 0.5  tablets (500 mg total) by mouth 2 (two) times daily with a meal. Renal dose  . Omega-3 Fatty Acids (FISH OIL) 1000 MG CAPS Take 1,000 mg by mouth daily.  . rivastigmine (EXELON) 9.5 mg/24hr APPLY 1 PATCH TOPICALLY ONCE DAILY  . senna (SENOKOT) 8.6 MG tablet Take 1 tablet by mouth daily.   No facility-administered encounter medications on file as of 10/27/2020.     Objective:   Goals Addressed            This Visit's Progress   . Chronic Disease Management Needs       CARE PLAN ENTRY (see longtitudinal plan of care for additional care plan information)  Current Barriers:  . Chronic Disease Management support, education, and care coordination needs related to HTN, HLD, PVD, CKD stage 3, DM, RA, memory loss  Clinical Goal(s) related to HTN, HLD, PVD, CKD stage 3, DM, RA, memory loss:  Over the next 30 days, patient will:  . Work with the care management team to address educational, disease management, and care coordination needs  . Call care management team with questions or concerns . Reach out to [facility] to schedule transportation as needed  Interventions related to HTN, HLD, PVD, CKD stage 3, DM, RA, memory loss:  . Assessed patient's education and care coordination needs . Discussed transportation needs o Patient has been in contact with [facility] and has arranged for transportation to nutrition visit with Jearld Fenton o Discussed that [place] may be able to provide transportation to other places  for necessary health maintenance with a 3 day notice . Reviewed and discussed upcoming appointment . Chart reviewed including recent office notes and lab results . 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 Discussed physical activity and mobility o Not very  physically active now but would like to begin exercise programs at the [facility] schedule for December and program information for [employer] . Provided with RN Care Manager contact number and encouraged to reach out as needed  Patient Self Care Activities related to HTN, HLD, PVD, CKD stage 3, DM, RA, memory loss:  . Patient is unable to independently self-manage chronic health conditions  Please see past updates related to this goal by clicking on the "Past Updates" button in the selected goal           Plan:  Telephone follow up appointment with care management team member scheduled for: 11/21/20 with LCSW The patient has been provided with contact information for the care management team and has been advised to call with any health related questions or concerns.  Next PCP appointment scheduled for: 01/24/21 with Dr Lajuana Ripple Appointment with dietician, Jearld Fenton, scheduled for 11/02/20   Chong Sicilian, BSN, RN-BC [place] [facility] [place] Management Direct Dial: [phone]

## 2020-11-02 ENCOUNTER — Other Ambulatory Visit: Payer: Self-pay

## 2020-11-02 ENCOUNTER — Encounter: Payer: Medicare PPO | Attending: Family Medicine | Admitting: Nutrition

## 2020-11-02 ENCOUNTER — Telehealth: Payer: Self-pay | Admitting: Nutrition

## 2020-11-02 VITALS — Ht 69.0 in | Wt 115.0 lb

## 2020-11-02 DIAGNOSIS — N181 Chronic kidney disease, stage 1: Secondary | ICD-10-CM | POA: Insufficient documentation

## 2020-11-02 DIAGNOSIS — E1122 Type 2 diabetes mellitus with diabetic chronic kidney disease: Secondary | ICD-10-CM

## 2020-11-02 DIAGNOSIS — N183 Chronic kidney disease, stage 3 unspecified: Secondary | ICD-10-CM | POA: Insufficient documentation

## 2020-11-02 DIAGNOSIS — R634 Abnormal weight loss: Secondary | ICD-10-CM | POA: Diagnosis not present

## 2020-11-02 DIAGNOSIS — E785 Hyperlipidemia, unspecified: Secondary | ICD-10-CM

## 2020-11-02 DIAGNOSIS — R636 Underweight: Secondary | ICD-10-CM

## 2020-11-02 DIAGNOSIS — I10 Essential (primary) hypertension: Secondary | ICD-10-CM

## 2020-11-02 NOTE — Telephone Encounter (Signed)
VM to Case Manager to discuss medication concerns and need for bubble packs.

## 2020-11-02 NOTE — Progress Notes (Signed)
Medical Nutrition Therapy:  Appt start time: 1030 end time:  1130.  Assessment:  Primary concerns today: Diabetes Type 2. Lives with her son. She cooks and her son helps get her groceries. .Didn't eat breakfast this am. But usually eats breakfast. She notes she sleeps in late and usually only eats 2 meals per day.  Frustrated with losing weight and losing strength. Can't seem to gain weight. Unsure about what medications she is taking. Was told to stop her Hydrochlorothiazide. Has dentures but doesn't wear them due not fitting right. Wants to get new dentures that fit since she has lost so much weight. Does drink Glucerna once a day. Her son works and leaves to go to work 1:30 pm and comes home at 10 pm or so. Limited family help. History of memory loss. She is very thin, bony prominents. Lost 5 lbs in the last 2 months and possibly more due to heavier clothes today .Wants to gain weight. Use to weigh 130-140s back in 2015.Marland Kitchen  She notes she sleeps a lot. Isn't on a schedule for anything. Doesn't drink much water. Only drinks green tea, diet soda or juice. Tests her blood sugars in am. Usually in the 140's. . Currently on 500 mg of Metformin BID. Lab Results  Component Value Date   HGBA1C 7.1 (H) 07/26/2020   CMP Latest Ref Rng & Units 09/07/2020 08/02/2020 07/26/2020  Glucose 70 - 99 mg/dL 133(H) 182(H) 225(H)  BUN 8 - 23 mg/dL 29(H) 56(H) 37(H)  Creatinine 0.44 - 1.00 mg/dL 1.26(H) 1.71(H) 1.41(H)  Sodium 135 - 145 mmol/L 139 138 140  Potassium 3.5 - 5.1 mmol/L 4.7 4.5 5.0  Chloride 98 - 111 mmol/L 106 103 101  CO2 22 - 32 mmol/L 24 21(L) 25  Calcium 8.9 - 10.3 mg/dL 9.6 9.7 10.4(H)  Total Protein 6.0 - 8.5 g/dL - - -  Total Bilirubin 0.0 - 1.2 mg/dL - - -  Alkaline Phos 48 - 121 IU/L - - -  AST 0 - 40 IU/L - - -  ALT 0 - 32 IU/L - - -   Wt Readings from Last 3 Encounters:  11/02/20 115 lb (52.2 kg)  09/19/20 118 lb 12.8 oz (53.9 kg)  09/12/20 120 lb (54.4 kg)   Ht Readings from  Last 3 Encounters:  11/02/20 5\' 9"  (1.753 m)  09/19/20 5\' 9"  (1.753 m)  09/12/20 5\' 9"  (1.753 m)   Body mass index is 16.98 kg/m. @BMIFA @ Facility age limit for growth percentiles is 20 years. Facility age limit for growth percentiles is 20 years.  Preferred Learning Style:  No preference indicated   Learning Readiness:  Change in progress   MEDICATIONS:    DIETARY INTAKE:    24-hr recall:  B ( AM): Time varies. Sausage eggs, sometimes grit s or oatmeal, sometimes eats blueberries, juice  Snk ( AM): water or diet soda L ( PM): macaroni and cheese, chicken tenders, turnip greens, green tea Snk ( PM):  D ( PM): Sometimes drink glucerna, Snk ( PM):  Beverages:   Usual physical activity: ADL  Estimated energy needs: 1800 calories 200 g carbohydrates 135 g protein 50 g fat  Progress Towards Goal(s):  In progress.   Nutritional Diagnosis:  NB-1.1 Food and nutrition-related knowledge deficit As related to Diabetes Type 2.  As evidenced by A1C 7.1%..    Intervention:  Nutrition and Diabetes education provided on My Plate, CHO counting, meal planning, portion sizes, timing of meals, avoiding snacks between meals unless having  a low blood sugar, target ranges for A1C and blood sugars, signs/symptoms and treatment of hyper/hypoglycemia, monitoring blood sugars, taking medications as prescribed, benefits of exercising 30 minutes per day and prevention of complications of DM. HIgh Calorie High Protein diet. Needs small frequent meals for needed weight gain. .Goals  Eat three meals per day Drink 2 Glucerna daily. Cut down on diet soda and drink more water Drink 4 cups of water per day. Meal ideas:  B) Eggs and oatmeal/ grits or cherrios and fruit. L) Tunafish/chickensalad or egg salad for lunch and fruit, D) Beans, greens, fruit or meatloaf, greens, cabbage or other lower carb vegetables  Drink 2 Glucerna per day for overall nutritional needs. Gain 1/2 lb per week or 2  lbs per month.  Teaching Method Utilized:  Visual Auditory Hands on  Handouts given during visit include:  The Plate Method  Ways to increase calories.    Barriers to learning/adherence to lifestyle change: cognitive memory  Demonstrated degree of understanding via:  Teach Back   Monitoring/Evaluation:  Dietary intake, exercise, , and body weight in 1 month(s).

## 2020-11-02 NOTE — Patient Instructions (Addendum)
Goals  Eat three meals per day Drink 2 Glucerna daily. Cut down on diet soda and drink more water Drink 4 cups of water per day. B) Eggs and oatmeal/ grits or cherrios and fruit. L) Tunafish/chickensalad or egg salad for lunch and fruit, D) Beans, greens, fruit or meatloaf, greens, cabbage or other lower carb vegetables  Drink 2 Glucerna per day for overall nutritional needs. Gain 1/2 lb per week or 2 lbs per month.

## 2020-11-03 NOTE — Telephone Encounter (Signed)
I am confused as to what this message is asking for.  Can we clarify with pt as to what her needs are?

## 2020-11-03 NOTE — Telephone Encounter (Signed)
lmtcb

## 2020-11-04 ENCOUNTER — Telehealth: Payer: Medicare PPO

## 2020-11-07 ENCOUNTER — Encounter (HOSPITAL_COMMUNITY)
Admission: RE | Admit: 2020-11-07 | Discharge: 2020-11-07 | Disposition: A | Discharge status: Home / Self Care | Payer: Medicare PPO | Source: Ambulatory Visit | Attending: Nephrology | Admitting: Nephrology

## 2020-11-07 ENCOUNTER — Other Ambulatory Visit: Payer: Self-pay

## 2020-11-07 ENCOUNTER — Encounter (HOSPITAL_COMMUNITY): Payer: Self-pay

## 2020-11-07 DIAGNOSIS — N1832 Chronic kidney disease, stage 3b: Secondary | ICD-10-CM | POA: Insufficient documentation

## 2020-11-07 DIAGNOSIS — D631 Anemia in chronic kidney disease: Secondary | ICD-10-CM | POA: Insufficient documentation

## 2020-11-07 LAB — POCT HEMOGLOBIN-HEMACUE: Hemoglobin: 10.7 g/dL — ABNORMAL LOW (ref 12.0–15.0)

## 2020-11-07 MED ORDER — EPOETIN ALFA-EPBX 3000 UNIT/ML IJ SOLN: 3000.0000 [IU] | Freq: Once | INTRAMUSCULAR | Status: DC

## 2020-11-08 ENCOUNTER — Telehealth: Payer: Self-pay | Admitting: Family Medicine

## 2020-11-08 NOTE — Telephone Encounter (Signed)
Spoke to pt and advised she ntbs for evaluation and offered virtual visit but pt declined stating she would go to [facility].

## 2020-11-08 NOTE — Telephone Encounter (Signed)
Lmtcb  No call back this encounter will closed.

## 2020-11-08 NOTE — Telephone Encounter (Signed)
  Incoming Patient Call  11/08/2020  What symptoms do you have? Feet hurt, legs swelling, dry mouth, smells bad when pees   How long have you been sick? months  Have you been seen for this problem? Yes   If your provider decides to give you a prescription, which pharmacy would you like for it to be sent to? [facility] in [place]   Patient informed that this information will be sent to the clinical staff for review and that they should receive a follow up call.

## 2020-11-09 ENCOUNTER — Telehealth: Payer: Medicare PPO | Admitting: *Deleted

## 2020-11-10 ENCOUNTER — Other Ambulatory Visit: Payer: Self-pay

## 2020-11-10 ENCOUNTER — Telehealth: Payer: Self-pay | Admitting: Nutrition

## 2020-11-10 ENCOUNTER — Other Ambulatory Visit (HOSPITAL_COMMUNITY)
Admission: RE | Admit: 2020-11-10 | Discharge: 2020-11-10 | Disposition: A | Discharge status: Home / Self Care | Payer: Medicare PPO | Source: Ambulatory Visit | Attending: Nephrology | Admitting: Nephrology

## 2020-11-10 DIAGNOSIS — D638 Anemia in other chronic diseases classified elsewhere: Secondary | ICD-10-CM | POA: Diagnosis not present

## 2020-11-10 DIAGNOSIS — N189 Chronic kidney disease, unspecified: Secondary | ICD-10-CM | POA: Diagnosis not present

## 2020-11-10 DIAGNOSIS — E1122 Type 2 diabetes mellitus with diabetic chronic kidney disease: Secondary | ICD-10-CM | POA: Insufficient documentation

## 2020-11-10 DIAGNOSIS — R809 Proteinuria, unspecified: Secondary | ICD-10-CM | POA: Diagnosis not present

## 2020-11-10 DIAGNOSIS — E1129 Type 2 diabetes mellitus with other diabetic kidney complication: Secondary | ICD-10-CM | POA: Insufficient documentation

## 2020-11-10 DIAGNOSIS — N170 Acute kidney failure with tubular necrosis: Secondary | ICD-10-CM | POA: Diagnosis not present

## 2020-11-10 DIAGNOSIS — I129 Hypertensive chronic kidney disease with stage 1 through stage 4 chronic kidney disease, or unspecified chronic kidney disease: Secondary | ICD-10-CM | POA: Diagnosis not present

## 2020-11-10 LAB — RENAL FUNCTION PANEL
Albumin: 3.6 g/dL (ref 3.5–5.0)
Anion gap: 10 (ref 5–15)
BUN: 30 mg/dL — ABNORMAL HIGH (ref 8–23)
CO2: 25 mmol/L (ref 22–32)
Calcium: 9.8 mg/dL (ref 8.9–10.3)
Chloride: 101 mmol/L (ref 98–111)
Creatinine, Ser: 1.16 mg/dL — ABNORMAL HIGH (ref 0.44–1.00)
GFR, Estimated: 48 mL/min — ABNORMAL LOW (ref >60)
Glucose, Bld: 201 mg/dL — ABNORMAL HIGH (ref 70–99)
Phosphorus: 3.4 mg/dL (ref 2.5–4.6)
Potassium: 4.3 mmol/L (ref 3.5–5.1)
Sodium: 136 mmol/L (ref 135–145)

## 2020-11-10 LAB — CBC
HCT: 36.8 % (ref 36.0–46.0)
Hemoglobin: 10.7 g/dL — ABNORMAL LOW (ref 12.0–15.0)
MCH: 22.6 pg — ABNORMAL LOW (ref 26.0–34.0)
MCHC: 29.1 g/dL — ABNORMAL LOW (ref 30.0–36.0)
MCV: 77.6 fL — ABNORMAL LOW (ref 80.0–100.0)
Platelets: 395 10*3/uL (ref 150–400)
RBC: 4.74 MIL/uL (ref 3.87–5.11)
RDW: 14.4 % (ref 11.5–15.5)
WBC: 7.1 10*3/uL (ref 4.0–10.5)
nRBC: 0 % (ref 0.0–0.2)

## 2020-11-10 LAB — FERRITIN: Ferritin: 64 ng/mL (ref 11–307)

## 2020-11-10 LAB — IRON AND TIBC
Iron: 56 ug/dL (ref 28–170)
Saturation Ratios: 18 % (ref 10.4–31.8)
TIBC: 309 ug/dL (ref 250–450)
UIBC: 253 ug/dL

## 2020-11-10 LAB — VITAMIN D 25 HYDROXY (VIT D DEFICIENCY, FRACTURES): Vit D, 25-Hydroxy: 45.79 ng/mL (ref 30–100)

## 2020-11-10 NOTE — Telephone Encounter (Signed)
VM left to call to office to follow up and see how she is doing.

## 2020-11-11 DIAGNOSIS — J0100 Acute maxillary sinusitis, unspecified: Secondary | ICD-10-CM | POA: Diagnosis not present

## 2020-11-11 DIAGNOSIS — R300 Dysuria: Secondary | ICD-10-CM | POA: Diagnosis not present

## 2020-11-11 DIAGNOSIS — N3001 Acute cystitis with hematuria: Secondary | ICD-10-CM | POA: Diagnosis not present

## 2020-11-11 DIAGNOSIS — R350 Frequency of micturition: Secondary | ICD-10-CM | POA: Diagnosis not present

## 2020-11-11 DIAGNOSIS — R8290 Unspecified abnormal findings in urine: Secondary | ICD-10-CM | POA: Diagnosis not present

## 2020-11-11 LAB — PTH, INTACT AND CALCIUM
Calcium, Total (PTH): 10.3 mg/dL (ref 8.7–10.3)
PTH: 30 pg/mL (ref 15–65)

## 2020-11-18 DIAGNOSIS — R2689 Other abnormalities of gait and mobility: Secondary | ICD-10-CM | POA: Diagnosis not present

## 2020-11-21 ENCOUNTER — Ambulatory Visit: Payer: Medicare PPO | Admitting: Licensed Clinical Social Worker

## 2020-11-21 DIAGNOSIS — I10 Essential (primary) hypertension: Secondary | ICD-10-CM

## 2020-11-21 DIAGNOSIS — R413 Other amnesia: Secondary | ICD-10-CM

## 2020-11-21 DIAGNOSIS — E1165 Type 2 diabetes mellitus with hyperglycemia: Secondary | ICD-10-CM

## 2020-11-21 DIAGNOSIS — N183 Chronic kidney disease, stage 3 unspecified: Secondary | ICD-10-CM

## 2020-11-21 DIAGNOSIS — I739 Peripheral vascular disease, unspecified: Secondary | ICD-10-CM

## 2020-11-21 NOTE — Chronic Care Management (AMB) (Signed)
  Chronic Care Management    Clinical Social Work Follow Up Note  11/21/2020 Name: Wendy [name] MRN: [id number] DOB: 1941-07-30  Wendy [name] is a 79 y.o. year old female who is a primary care patient of Janora Norlander, DO. The CCM team was consulted for assistance with [employer] .   Review of patient status, including review of consultants reports, other relevant assessments, and collaboration with appropriate care team members and the patient's provider was performed as part of comprehensive patient evaluation and provision of chronic care management services.    SDOH (Social Determinants of Health) assessments performed: No; risk for depression; risk for physical inactivity; risk for tobacco use; risk for financial strain; risk for stress  Flowsheet Row Chronic Care Management from 10/18/2020 in [facility]  PHQ-9 Total Score 7     GAD 7 : Generalized Anxiety Score 10/18/2020  Nervous, Anxious, on Edge 1  Control/stop worrying 1  Worry too much - different things 1  Trouble relaxing 0  Restless 0  Easily annoyed or irritable 0  Afraid - awful might happen 1  Total GAD 7 Score 4  Anxiety Difficulty Somewhat difficult    Outpatient Encounter Medications as of 11/21/2020  Medication Sig  . aspirin EC 81 MG tablet Take 81 mg by mouth daily.  . cilostazol (PLETAL) 100 MG tablet Take 1 tablet (100 mg total) by mouth daily.  . cloNIDine (CATAPRES - DOSED IN MG/24 HR) 0.2 mg/24hr patch Place 1 patch (0.2 mg total) onto the skin once a week.  . cycloSPORINE (RESTASIS) 0.05 % ophthalmic emulsion Place 1 drop into both eyes 2 (two) times daily.  . diclofenac Sodium (VOLTAREN) 1 % GEL Apply 2 g topically daily.  Marland Kitchen epoetin alfa-epbx (RETACRIT) 3000 UNIT/ML injection 3,000 Units every 14 (fourteen) days.  . folic acid (FOLVITE) 1 MG tablet Take 1 tablet (1 mg total) by mouth daily.  . hydrochlorothiazide (HYDRODIURIL) 12.5 MG tablet Take 1 tablet  (12.5 mg total) by mouth daily.  . meclizine (ANTIVERT) 25 MG tablet TAKE 1 TABLET BY MOUTH THREE TIMES DAILY AS NEEDED FOR DIZZINESS (Patient not taking: Reported on 10/05/2020)  . memantine (NAMENDA) 10 MG tablet Take 1 tablet (10 mg total) by mouth in the morning and at bedtime.  . metFORMIN (GLUCOPHAGE) 1000 MG tablet Take 0.5 tablets (500 mg total) by mouth 2 (two) times daily with a meal. Renal dose  . Omega-3 Fatty Acids (FISH OIL) 1000 MG CAPS Take 1,000 mg by mouth daily.  . rivastigmine (EXELON) 9.5 mg/24hr APPLY 1 PATCH TOPICALLY ONCE DAILY  . senna (SENOKOT) 8.6 MG tablet Take 1 tablet by mouth daily.   No facility-administered encounter medications on file as of 11/21/2020.    LCSW called home phone number of client today but LCSW was not able to speak via phone with client today; however, LCSW did leave phone message for client requesting client return call to LCSW at [phone]  Follow Up Plan: LCSW to call client in next 4 weeks to discuss client management of health needs of client  Norva Riffle.Felicitas Sine MSW, LCSW Licensed Clinical Social Worker [facility] Care Management [phone]

## 2020-11-21 NOTE — Patient Instructions (Addendum)
Licensed Clinical Social Worker Visit Information  Materials Provided: No  11/21/2020  Name: Wendy [name]           MRN: [id number]       DOB: [birth year]  Wendy [name] is a 79 y.o. year old female who is a primary care patient of Janora Norlander, DO. The CCM team was consulted for assistance with [employer] .   Review of patient status, including review of consultants reports, other relevant assessments, and collaboration with appropriate care team members and the patient's provider was performed as part of comprehensive patient evaluation and provision of chronic care management services.    SDOH (Social Determinants of Health) assessments performed: No; risk for depression; risk for physical inactivity; risk for tobacco use; risk for financial strain; risk for stress  LCSW called home phone number of client today but LCSW was not able to speak via phone with client today; however, LCSW did leave phone message for client requesting client return call to LCSW at [phone]  Follow Up Plan:LCSW to call client in next 4 weeks to discuss client management of health needs of client  LCSW was not able to speak via phone with client today; thus, client was not able to verbalize understanding of instructions provided today and was not able to accept or decline a print copy of patient instruction materials.   Norva Riffle.Azlyn Wingler MSW, LCSW Licensed Clinical Social Worker [facility] Care Management [phone]

## 2020-11-22 ENCOUNTER — Encounter (HOSPITAL_COMMUNITY)
Admission: RE | Admit: 2020-11-22 | Discharge: 2020-11-22 | Disposition: A | Discharge status: Home / Self Care | Payer: Medicare PPO | Source: Ambulatory Visit | Attending: Nephrology | Admitting: Nephrology

## 2020-11-22 ENCOUNTER — Encounter (HOSPITAL_COMMUNITY): Payer: Self-pay

## 2020-11-22 ENCOUNTER — Other Ambulatory Visit: Payer: Self-pay

## 2020-11-22 DIAGNOSIS — D631 Anemia in chronic kidney disease: Secondary | ICD-10-CM | POA: Diagnosis not present

## 2020-11-22 DIAGNOSIS — N1832 Chronic kidney disease, stage 3b: Secondary | ICD-10-CM | POA: Diagnosis not present

## 2020-11-22 LAB — POCT HEMOGLOBIN-HEMACUE: Hemoglobin: 10 g/dL — ABNORMAL LOW (ref 12.0–15.0)

## 2020-11-22 MED ORDER — EPOETIN ALFA-EPBX 3000 UNIT/ML IJ SOLN: 3000.0000 [IU] | Freq: Once | INTRAMUSCULAR | Status: DC

## 2020-11-28 ENCOUNTER — Ambulatory Visit: Payer: Medicare PPO | Admitting: Family Medicine

## 2020-11-28 ENCOUNTER — Encounter: Payer: Self-pay | Admitting: Nurse Practitioner

## 2020-11-28 ENCOUNTER — Ambulatory Visit (INDEPENDENT_AMBULATORY_CARE_PROVIDER_SITE_OTHER): Payer: Medicare PPO | Admitting: Nurse Practitioner

## 2020-11-28 DIAGNOSIS — Z91199 Patient's noncompliance with other medical treatment and regimen due to unspecified reason: Secondary | ICD-10-CM

## 2020-11-28 DIAGNOSIS — Z5329 Procedure and treatment not carried out because of patient's decision for other reasons: Secondary | ICD-10-CM

## 2020-12-04 ENCOUNTER — Encounter (HOSPITAL_COMMUNITY): Payer: Self-pay | Admitting: *Deleted

## 2020-12-04 ENCOUNTER — Other Ambulatory Visit: Payer: Self-pay

## 2020-12-04 DIAGNOSIS — E1122 Type 2 diabetes mellitus with diabetic chronic kidney disease: Secondary | ICD-10-CM | POA: Insufficient documentation

## 2020-12-04 DIAGNOSIS — I129 Hypertensive chronic kidney disease with stage 1 through stage 4 chronic kidney disease, or unspecified chronic kidney disease: Secondary | ICD-10-CM | POA: Insufficient documentation

## 2020-12-04 DIAGNOSIS — Z79899 Other long term (current) drug therapy: Secondary | ICD-10-CM | POA: Diagnosis not present

## 2020-12-04 DIAGNOSIS — Z7982 Long term (current) use of aspirin: Secondary | ICD-10-CM | POA: Diagnosis not present

## 2020-12-04 DIAGNOSIS — S0990XA Unspecified injury of head, initial encounter: Secondary | ICD-10-CM | POA: Diagnosis present

## 2020-12-04 DIAGNOSIS — Z7984 Long term (current) use of oral hypoglycemic drugs: Secondary | ICD-10-CM | POA: Insufficient documentation

## 2020-12-04 DIAGNOSIS — W01198A Fall on same level from slipping, tripping and stumbling with subsequent striking against other object, initial encounter: Secondary | ICD-10-CM | POA: Insufficient documentation

## 2020-12-04 DIAGNOSIS — S0083XA Contusion of other part of head, initial encounter: Secondary | ICD-10-CM | POA: Diagnosis not present

## 2020-12-04 DIAGNOSIS — N183 Chronic kidney disease, stage 3 unspecified: Secondary | ICD-10-CM | POA: Diagnosis not present

## 2020-12-04 NOTE — ED Triage Notes (Signed)
Pt tripped and fell striking her head on a swing on her patio tonight.  Pt on ASA daily.  Pt denies any LOC.  swelling noted to above left eye.

## 2020-12-05 ENCOUNTER — Emergency Department (HOSPITAL_COMMUNITY)
Admission: EM | Admit: 2020-12-05 | Discharge: 2020-12-05 | Disposition: A | Discharge status: Home / Self Care | Payer: Medicare PPO | Attending: Emergency Medicine | Admitting: Emergency Medicine

## 2020-12-05 ENCOUNTER — Telehealth: Payer: Self-pay | Admitting: Nutrition

## 2020-12-05 DIAGNOSIS — E1122 Type 2 diabetes mellitus with diabetic chronic kidney disease: Secondary | ICD-10-CM

## 2020-12-05 DIAGNOSIS — N183 Chronic kidney disease, stage 3 unspecified: Secondary | ICD-10-CM

## 2020-12-05 DIAGNOSIS — N181 Chronic kidney disease, stage 1: Secondary | ICD-10-CM

## 2020-12-05 DIAGNOSIS — S0083XA Contusion of other part of head, initial encounter: Secondary | ICD-10-CM

## 2020-12-05 DIAGNOSIS — E785 Hyperlipidemia, unspecified: Secondary | ICD-10-CM

## 2020-12-05 NOTE — ED Provider Notes (Signed)
[facility] EMERGENCY DEPARTMENT Provider Note   CSN: [id number] Arrival date & time: 12/04/20  2022   Time seen 1:20 AM  History No chief complaint on file.   Wendy [name] is a 80 y.o. female.  HPI   Patient states about 7 PM tonight she was outside on her porch and it was raining and wet and she tripped and fell and hit her left forehead on a porch swing.  She denies loss of consciousness.  She denies nausea, vomiting, blurred vision, neck pain, or new numbness or tingling of her extremities.  She states she only takes a aspirin 81 mg a day as a blood thinner.  Patient states her son lives with her.  PCP Janora Norlander, DO  Past Medical History:  Diagnosis Date  . Acquired hammer toes of both feet   . Anemia   . Anxiety   . Arthritis   . Chronic kidney disease   . Diabetes mellitus without complication ([place])   . Eczema   . Environmental allergies   . Fibrocystic breast changes   . GERD (gastroesophageal reflux disease)   . Hearing loss   . Hypercholesteremia   . Hypertension   . IBS (irritable bowel syndrome)   . Incontinence   . Memory loss   . Osteoarthritis   . PVD (peripheral vascular disease) ([place])   . Rheumatoid arthritis [facility])     Patient Active Problem List   Diagnosis Date Noted  . No-show for appointment 11/28/2020  . Localized edema 07/11/2020  . Cerumen debris on tympanic membrane of both ears 07/11/2020  . Methotrexate, long term, current use 12/05/2017  . CKD stage 3 due to type 2 diabetes mellitus ([place]) 01/13/2015  . Dyslipidemia 01/13/2015  . Memory loss 04/09/2014  . Adrenal nodule ([place]) 02/27/2014  . Thyroid nodule 02/27/2014  . Microcytic anemia 02/25/2014  . Rheumatoid arthritis ([place]) 02/25/2014  . PVD (peripheral vascular disease) ([place]) 12/02/2012  . DYSPHAGIA UNSPECIFIED 08/18/2008  . COLONIC POLYPS 08/17/2008  . Type 2 diabetes mellitus with kidney complication, without long-term current use of insulin ([place]) 08/17/2008  .  Essential hypertension 08/17/2008  . DIVERTICULOSIS, COLON 08/18/2002    Past Surgical History:  Procedure Laterality Date  . ABDOMINAL HYSTERECTOMY    . SHOULDER SURGERY     FATTY TUMOR REMOVED     OB History   No obstetric history on file.     Family History  Problem Relation Age of Onset  . Diabetes Mother   . Hypertension Mother   . Alzheimer's disease Mother   . Heart disease Father   . Hypertension Sister   . Diabetes Sister   . Heart disease Sister   . Hypertension Brother   . Diabetes Brother   . Cancer Sister        lung  . Heart disease Sister     Social History   Tobacco Use  . Smoking status: Never Smoker  . Smokeless tobacco: Never Used  Vaping Use  . Vaping Use: Never used  Substance Use Topics  . Alcohol use: No  . Drug use: No    Home Medications Prior to Admission medications   Medication Sig Start Date End Date Taking? Authorizing Provider  aspirin EC 81 MG tablet Take 81 mg by mouth daily.    [provider]  cilostazol (PLETAL) 100 MG tablet Take 1 tablet (100 mg total) by mouth daily. 04/13/20   Janora Norlander, DO  cloNIDine (CATAPRES - DOSED IN  MG/24 HR) 0.2 mg/24hr patch Place 1 patch (0.2 mg total) onto the skin once a week. 09/07/20   Janora Norlander, DO  cycloSPORINE (RESTASIS) 0.05 % ophthalmic emulsion Place 1 drop into both eyes 2 (two) times daily. 04/13/20   Janora Norlander, DO  diclofenac Sodium (VOLTAREN) 1 % GEL Apply 2 g topically daily.    [provider]  epoetin alfa-epbx (RETACRIT) 3000 UNIT/ML injection 3,000 Units every 14 (fourteen) days.    Bhutani, Manpreet S, MD  folic acid (FOLVITE) 1 MG tablet Take 1 tablet (1 mg total) by mouth daily. 05/06/20   Janora Norlander, DO  hydrochlorothiazide (HYDRODIURIL) 12.5 MG tablet Take 1 tablet (12.5 mg total) by mouth daily. 08/18/20   Janora Norlander, DO  meclizine (ANTIVERT) 25 MG tablet TAKE 1 TABLET BY MOUTH THREE TIMES DAILY AS NEEDED FOR  DIZZINESS Patient not taking: Reported on 10/05/2020 09/27/20   Janora Norlander, DO  memantine (NAMENDA) 10 MG tablet Take 1 tablet (10 mg total) by mouth in the morning and at bedtime. 04/13/20   Janora Norlander, DO  metFORMIN (GLUCOPHAGE) 1000 MG tablet Take 0.5 tablets (500 mg total) by mouth 2 (two) times daily with a meal. Renal dose 03/07/20   Ronnie Doss M, DO  Omega-3 Fatty Acids (FISH OIL) 1000 MG CAPS Take 1,000 mg by mouth daily.    [provider]  rivastigmine (EXELON) 9.5 mg/24hr APPLY 1 PATCH TOPICALLY ONCE DAILY 07/25/20   Ronnie Doss M, DO  senna (SENOKOT) 8.6 MG tablet Take 1 tablet by mouth daily.    [provider]    Allergies    Codeine, Erythromycin, Leflunomide, Lisinopril, Naproxen, Penicillins, Pioglitazone, and Valsartan  Review of Systems   Review of Systems  All other systems reviewed and are negative.   Physical Exam Updated Vital Signs BP (!) 179/106 (BP Location: Right Arm)   Pulse 93   Temp 98.3 F (36.8 C) (Oral)   Resp 18   Ht 5' 9.5" (1.765 m)   Wt 63.5 kg   SpO2 96%   BMI 20.38 kg/m   Physical Exam Vitals and nursing note reviewed.  Constitutional:      General: She is not in acute distress.    Appearance: Normal appearance. She is normal weight. She is not ill-appearing or toxic-appearing.     Comments: Pleasant elderly female who is alert and cooperative  HENT:     Head: Normocephalic.     Comments: Patient is noted to have swelling of her left forehead and left lateral eyebrow.  She has some faint bruising already of her left eyelid.    Right Ear: External ear normal.     Left Ear: External ear normal.  Eyes:     Extraocular Movements: Extraocular movements intact.     Conjunctiva/sclera: Conjunctivae normal.     Pupils: Pupils are equal, round, and reactive to light.  Cardiovascular:     Rate and Rhythm: Normal rate.  Pulmonary:     Effort: Pulmonary effort is normal. No respiratory distress.   Musculoskeletal:     Cervical back: Normal range of motion and neck supple.  Skin:    General: Skin is warm and dry.  Neurological:     General: No focal deficit present.     Mental Status: She is alert and oriented to person, place, and time.     Cranial Nerves: No cranial nerve deficit.  Psychiatric:        Mood and Affect:  Mood normal.        Behavior: Behavior normal.        Thought Content: Thought content normal.       ED Results / Procedures / Treatments   Labs (all labs ordered are listed, but only abnormal results are displayed) Labs Reviewed - No data to display  EKG None  Radiology No results found.  Procedures Procedures (including critical care time)  Medications Ordered in ED Medications - No data to display  ED Course  I have reviewed the triage vital signs and the nursing notes.  Pertinent labs & imaging results that were available during my care of the patient were reviewed by me and considered in my medical decision making (see chart for details).    MDM Rules/Calculators/A&P                         Patient's main concern was whether she had had a concussion.  We also discussed CT scan of her head.  I told her that concussion was not diagnosed by CAT scan but by symptoms and her symptoms do not indicate that she had a concussion.  She has full memory of what happened and has had no symptoms to suggest it since.  She then was questioning the cost of the CT scan.  She also told me she had just had a CT scan recently.  Actually when I review her chart she had an MRI of her brain done in November.  Patient has decided she does not want the CT scan done.  She was advised to have somebody stay with her today to monitor her.    Final Clinical Impression(s) / ED Diagnoses Final diagnoses:  Contusion of forehead, initial encounter    Rx / DC Orders ED Discharge Orders    None     Plan discharge  Rolland Porter, MD, Barbette Or, MD 12/05/20  [phone]

## 2020-12-05 NOTE — Discharge Instructions (Addendum)
Use ice packs on the swollen area to help with the swelling and bruising.  You will develop a black eye on the left as gravity has the blood in the swollen part of your forehead drift down into your eyelids.  This is nothing to be alarmed about but would be expected.  You can safely take Tylenol if needed for discomfort.  You should return to the emergency department for any problems listed on the head injury sheet.  You should have someone stay with you today, January 10 to make sure you are okay.

## 2020-12-05 NOTE — Telephone Encounter (Signed)
TC... she reports her current wt is 120 lbs. Depressed about her weight. She wants to gain weight. Has been trying to eat better but fearful of losing weight and not getting the food she needs. She tries to drink a Glucerna daily. Admits to being depressed and wants some help. Admits she doesn't want to die but needs help getting better. She notes all her family is out of town except one of her son's who tries to help but limited with what he can do for her. Ate An omelette with vegetables that her son cooked for her. Feel thi sam and got a bump on her head and spent the whole day in the ER. She notes she hasn't eaten anything all day. Admits to missing some of her medications for her diabetes. She notes her blood sugar was the highest it has ever been at 201 today in ER.     Upset that she can't do much anymore and is worried about being able to take care of herself at home. She admits she needs some to help her with her medications, fixing her meals and helping her with at [facility].     She is suppose to see a provider at [facility] tomorrow at 930.      Informed her I would notify her provider and will contact her next week to discuss her diabetes and eating plan in greater detail. Marland Kitchen

## 2020-12-06 ENCOUNTER — Ambulatory Visit: Payer: Medicare PPO | Admitting: Nurse Practitioner

## 2020-12-06 NOTE — Telephone Encounter (Signed)
FYI

## 2020-12-06 NOTE — Telephone Encounter (Signed)
Ccing to provider that is seeing her today.  MMM, not sure what she is seeing you for but can you make sure she gets scheduled with me for a 3min f/u so we can discuss placement/ assisted living/ health aide (need her son with her)

## 2020-12-06 NOTE — Telephone Encounter (Signed)
She was a no show today and is seeing J tomorrow

## 2020-12-07 ENCOUNTER — Ambulatory Visit: Payer: Medicare PPO | Admitting: Nurse Practitioner

## 2020-12-07 ENCOUNTER — Encounter (HOSPITAL_COMMUNITY)
Admission: RE | Admit: 2020-12-07 | Discharge: 2020-12-07 | Disposition: A | Discharge status: Home / Self Care | Payer: Medicare PPO | Source: Ambulatory Visit | Attending: Nephrology | Admitting: Nephrology

## 2020-12-07 ENCOUNTER — Other Ambulatory Visit: Payer: Self-pay

## 2020-12-07 DIAGNOSIS — N1832 Chronic kidney disease, stage 3b: Secondary | ICD-10-CM | POA: Diagnosis not present

## 2020-12-07 DIAGNOSIS — D631 Anemia in chronic kidney disease: Secondary | ICD-10-CM | POA: Insufficient documentation

## 2020-12-07 LAB — POCT HEMOGLOBIN-HEMACUE: Hemoglobin: 12 g/dL (ref 12.0–15.0)

## 2020-12-08 ENCOUNTER — Encounter: Payer: Self-pay | Admitting: Family Medicine

## 2020-12-08 ENCOUNTER — Encounter: Payer: Self-pay | Admitting: Nutrition

## 2020-12-08 ENCOUNTER — Encounter: Payer: Medicare PPO | Attending: Family Medicine | Admitting: Nutrition

## 2020-12-08 DIAGNOSIS — I10 Essential (primary) hypertension: Secondary | ICD-10-CM | POA: Insufficient documentation

## 2020-12-08 DIAGNOSIS — N183 Chronic kidney disease, stage 3 unspecified: Secondary | ICD-10-CM | POA: Diagnosis not present

## 2020-12-08 DIAGNOSIS — E1122 Type 2 diabetes mellitus with diabetic chronic kidney disease: Secondary | ICD-10-CM | POA: Diagnosis not present

## 2020-12-08 DIAGNOSIS — N181 Chronic kidney disease, stage 1: Secondary | ICD-10-CM | POA: Diagnosis not present

## 2020-12-08 DIAGNOSIS — E785 Hyperlipidemia, unspecified: Secondary | ICD-10-CM | POA: Diagnosis not present

## 2020-12-08 DIAGNOSIS — R636 Underweight: Secondary | ICD-10-CM | POA: Diagnosis not present

## 2020-12-08 DIAGNOSIS — R634 Abnormal weight loss: Secondary | ICD-10-CM | POA: Insufficient documentation

## 2020-12-08 NOTE — Progress Notes (Signed)
This visit was completed via telephone due to the COVID-19 pandemic.   I spoke with Wendy [name]  and verified that I was speaking with the correct person with two patient identifiers (full name and date of birth).   I discussed the limitations related to this kind of visit and the patient is willing to proceed.  Medical Nutrition Therapy:  Appt start time: 1100 end time:  1130.  Assessment:  Primary concerns today: Diabetes Type 2. Lives with her son. She cooks and her son helps get her groceries. She fell a few days ago and has been trying to get an MD appt that was scheduled for yesterday at [facility] with Ronnald Collum.   Unknown weight of 140 lbs reported in chart. Questionable. Hopefully will get [name] accurate weight when she goes to MD this next few days.    Her son was taking her and car broke down and wasn't able to do a visit with doctor yesterday. Her car is now in the shop.   Her son has taken a few days off work to be with her this week to help her since she fell and to take her to MD appt.  No headaches. Eating better. Her son has been helping her with meals and eating better since she fell. Made some vegetable soup to eat.  Has been off track with taking her medications due to all these issues.  Sleeps in late today due to staying up late from all the issues she had yesterday. Stressed about her car getting fixed and the cost. Stressed about her weight loss.  Worried about getting to the MD and wanting a good complete check up.. Currently on 500 mg of Metformin BID.  Hemoglobin has improved to 12. She is grateful for that.  Appears very depressed and feels hopeless. Will request referral to therapist. She would benefit from a personal care aide due to history of falls, medication mismanagement and cognitive function.   Lab Results  Component Value Date   HGBA1C 7.1 (H) 07/26/2020   CMP Latest Ref Rng & Units 11/10/2020 11/10/2020 09/07/2020  Glucose 70 - 99 mg/dL 201(H) -  133(H)  BUN 8 - 23 mg/dL 30(H) - 29(H)  Creatinine 0.44 - 1.00 mg/dL 1.16(H) - 1.26(H)  Sodium 135 - 145 mmol/L 136 - 139  Potassium 3.5 - 5.1 mmol/L 4.3 - 4.7  Chloride 98 - 111 mmol/L 101 - 106  CO2 22 - 32 mmol/L 25 - 24  Calcium 8.7 - 10.3 mg/dL 9.8 10.3 9.6  Total Protein 6.0 - 8.5 g/dL - - -  Total Bilirubin 0.0 - 1.2 mg/dL - - -  Alkaline Phos 48 - 121 IU/L - - -  AST 0 - 40 IU/L - - -  ALT 0 - 32 IU/L - - -   Wt Readings from Last 3 Encounters:  12/04/20 140 lb (63.5 kg)  11/02/20 115 lb (52.2 kg)  09/19/20 118 lb 12.8 oz (53.9 kg)   Ht Readings from Last 3 Encounters:  12/04/20 5' 9.5" (1.765 m)  11/02/20 5\' 9"  (1.753 m)  09/19/20 5\' 9"  (1.753 m)   There is no height or weight on file to calculate BMI. @BMIFA @ Facility age limit for growth percentiles is 20 years. Facility age limit for growth percentiles is 20 years.  Preferred Learning Style:  No preference indicated   Learning Readiness:  Change in progress   MEDICATIONS:    DIETARY INTAKE:    24-hr recall:  B ( AM):  Time varies. Sausage eggs, sometimes grit s or oatmeal, sometimes eats blueberries, juice  Snk ( AM): water or diet soda L ( PM): scrambled eggs, grits and  Snk ( PM):  D ( PM): beef and vegetable soup, water  Snk ( PM):  Beverages: water  Usual physical activity: ADL  Estimated energy needs: 1800 calories 200 g carbohydrates 135 g protein 50 g fat  Progress Towards Goal(s):  In progress.   Nutritional Diagnosis:  NB-1.1 Food and nutrition-related knowledge deficit As related to Diabetes Type 2.  As evidenced by A1C 7.1%..    Intervention:  Nutrition and Diabetes education provided on My Plate, CHO counting, meal planning, portion sizes, timing of meals, avoiding snacks between meals unless having a low blood sugar, target ranges for A1C and blood sugars, signs/symptoms and treatment of hyper/hypoglycemia, monitoring blood sugars, taking medications as prescribed, benefits of  exercising 30 minutes per day and prevention of complications of DM. HIgh Calorie High Protein diet. Needs small frequent meals for needed weight gain. .Goals  Eat small frequent meals. Drink 2 Glucerna a day Try eating 1/2 PB sandwich between meals or yogurt, cottage cheese/fruit or Healthy snacks between meals. Drink 4 bottles of water per day When able, test blood sugar in am and before bed. Take Medications as prescibed. Talk to MD about getting your medications bubble packed .  Teaching Method Utilized:   Auditory  Handouts given during visit include:        Barriers to learning/adherence to lifestyle change: cognitive memory  Demonstrated degree of understanding via:  Teach Back   Monitoring/Evaluation:  Dietary intake, exercise, , and body weight in 1 month(s).Appears very depressed and feels hopeless. Will request referral to therapist. She would benefit from a personal care aide due to history of falls, medication mismanagement and cognitive function. Is high risk for falls now. Will put in a referral for congregational nursing.

## 2020-12-08 NOTE — Telephone Encounter (Signed)
Patient did not come for the appointment. Just wanted you to be aware.

## 2020-12-08 NOTE — Patient Instructions (Signed)
.  Goals  Eat small frequent meals. Drink 2 Glucerna a day Try eating 1/2 PB sandwich between meals or yogurt, cottage cheese/fruit or Healthy snacks between meals. Drink 4 bottles of water per day When able, test blood sugar in am and before bed. Take Medications as prescibed. Talk to MD about getting your medications bubble packed .

## 2020-12-14 ENCOUNTER — Telehealth: Payer: Self-pay

## 2020-12-14 NOTE — Telephone Encounter (Signed)
All meds are correct with pts pill box. Is she supposed to be taking Pletal?? Pharmacist said, NO?

## 2020-12-16 ENCOUNTER — Ambulatory Visit: Payer: Medicare PPO | Admitting: Nurse Practitioner

## 2020-12-16 NOTE — Telephone Encounter (Signed)
Left message to call back  

## 2020-12-16 NOTE — Telephone Encounter (Signed)
WE DO have a prescription for Pletal and I was under the impression she was taking this.  Last rx for 1 year 03/2020.

## 2020-12-19 ENCOUNTER — Ambulatory Visit: Payer: Medicare PPO | Admitting: Neurology

## 2020-12-19 DIAGNOSIS — R2689 Other abnormalities of gait and mobility: Secondary | ICD-10-CM | POA: Diagnosis not present

## 2020-12-20 ENCOUNTER — Telehealth: Payer: Self-pay

## 2020-12-20 NOTE — Telephone Encounter (Signed)
Wendy [name] called in to discuss her dismissal from the practice.  I did go over the no-show policy and the effort that Dr. Lajuana Ripple has made to give her a second chance previously and patient still continued to no show again.  Letters were sent to patient each time she no showed and reiterated the importance of keeping her appointments.

## 2020-12-22 ENCOUNTER — Encounter (HOSPITAL_COMMUNITY): Admission: RE | Admit: 2020-12-22 | Payer: Medicare PPO | Source: Ambulatory Visit

## 2020-12-23 ENCOUNTER — Other Ambulatory Visit: Payer: Self-pay | Admitting: Family Medicine

## 2020-12-23 ENCOUNTER — Encounter: Payer: Self-pay | Admitting: Nutrition

## 2020-12-23 NOTE — Telephone Encounter (Signed)
Spoke with patient, she will bring meds by and discuss with [place].  She will call and let us know when she can come.

## 2020-12-23 NOTE — Telephone Encounter (Signed)
Please have patient physically bring in meds and see triage.  We will need to look at pills in her box and bottles to compare and straighten this out

## 2020-12-23 NOTE — Telephone Encounter (Signed)
Patient called and is very upset with [place] for dismissing her from practice.  I explained to her the policy and the no-show letters were sent to her explaining this. I also stated that she had been given extra chances and still had more no shows.

## 2020-12-26 ENCOUNTER — Encounter (HOSPITAL_COMMUNITY): Payer: Self-pay

## 2020-12-26 ENCOUNTER — Encounter (HOSPITAL_COMMUNITY)
Admission: RE | Admit: 2020-12-26 | Discharge: 2020-12-26 | Disposition: A | Discharge status: Home / Self Care | Payer: Medicare PPO | Source: Ambulatory Visit | Attending: Nephrology | Admitting: Nephrology

## 2020-12-26 ENCOUNTER — Telehealth: Payer: Medicare PPO

## 2020-12-26 ENCOUNTER — Other Ambulatory Visit: Payer: Self-pay

## 2020-12-26 DIAGNOSIS — R531 Weakness: Secondary | ICD-10-CM | POA: Diagnosis not present

## 2020-12-26 DIAGNOSIS — E860 Dehydration: Secondary | ICD-10-CM | POA: Diagnosis not present

## 2020-12-26 DIAGNOSIS — I951 Orthostatic hypotension: Secondary | ICD-10-CM | POA: Diagnosis not present

## 2020-12-26 DIAGNOSIS — R682 Dry mouth, unspecified: Secondary | ICD-10-CM | POA: Diagnosis not present

## 2020-12-26 DIAGNOSIS — N1832 Chronic kidney disease, stage 3b: Secondary | ICD-10-CM | POA: Diagnosis not present

## 2020-12-26 DIAGNOSIS — N3001 Acute cystitis with hematuria: Secondary | ICD-10-CM | POA: Diagnosis not present

## 2020-12-26 DIAGNOSIS — D631 Anemia in chronic kidney disease: Secondary | ICD-10-CM | POA: Diagnosis not present

## 2020-12-26 LAB — POCT HEMOGLOBIN-HEMACUE: Hemoglobin: 9.8 g/dL — ABNORMAL LOW (ref 12.0–15.0)

## 2020-12-26 MED ORDER — EPOETIN ALFA-EPBX 3000 UNIT/ML IJ SOLN
INTRAMUSCULAR | Status: AC
  Filled 2020-12-26: qty 1

## 2020-12-26 MED ORDER — EPOETIN ALFA-EPBX 3000 UNIT/ML IJ SOLN
3000.0000 [IU] | Freq: Once | INTRAMUSCULAR | Status: AC
  Administered 2020-12-26: 3000 [IU] via SUBCUTANEOUS

## 2020-12-29 ENCOUNTER — Other Ambulatory Visit: Payer: Self-pay | Admitting: Family Medicine

## 2020-12-29 ENCOUNTER — Telehealth: Payer: Self-pay

## 2020-12-29 MED ORDER — RIVASTIGMINE 9.5 MG/24HR TD PT24: MEDICATED_PATCH | TRANSDERMAL | 2 refills | Status: DC

## 2020-12-29 NOTE — Telephone Encounter (Signed)
    Prescription Request  12/29/2020  What is the name of the medication or equipment?  Rivastigmine (Patch 24 hr)  Have you contacted your pharmacy to request a refill? (if applicable) no  Which pharmacy would you like this sent to? [facility]   Patient notified that their request is being sent to the clinical staff for review and that they should receive a response within 2 business days.    Patient is dismissed, but would like to get her medications refilled as she is looking for a new provider.

## 2020-12-29 NOTE — Telephone Encounter (Signed)
Previous Dr. Darnell Level patient - please review and advise

## 2021-01-02 DIAGNOSIS — E1165 Type 2 diabetes mellitus with hyperglycemia: Secondary | ICD-10-CM | POA: Diagnosis not present

## 2021-01-02 DIAGNOSIS — I739 Peripheral vascular disease, unspecified: Secondary | ICD-10-CM | POA: Diagnosis not present

## 2021-01-02 DIAGNOSIS — K08109 Complete loss of teeth, unspecified cause, unspecified class: Secondary | ICD-10-CM | POA: Diagnosis not present

## 2021-01-02 DIAGNOSIS — R627 Adult failure to thrive: Secondary | ICD-10-CM | POA: Diagnosis not present

## 2021-01-09 ENCOUNTER — Other Ambulatory Visit: Payer: Self-pay

## 2021-01-09 ENCOUNTER — Encounter (HOSPITAL_COMMUNITY)
Admission: RE | Admit: 2021-01-09 | Discharge: 2021-01-09 | Disposition: A | Discharge status: Home / Self Care | Payer: Medicare PPO | Source: Ambulatory Visit | Attending: Nephrology | Admitting: Nephrology

## 2021-01-09 ENCOUNTER — Encounter (HOSPITAL_COMMUNITY): Payer: Self-pay

## 2021-01-09 DIAGNOSIS — D631 Anemia in chronic kidney disease: Secondary | ICD-10-CM | POA: Diagnosis present

## 2021-01-09 DIAGNOSIS — N1832 Chronic kidney disease, stage 3b: Secondary | ICD-10-CM | POA: Insufficient documentation

## 2021-01-09 LAB — POCT HEMOGLOBIN-HEMACUE: Hemoglobin: 10.2 g/dL — ABNORMAL LOW (ref 12.0–15.0)

## 2021-01-09 MED ORDER — EPOETIN ALFA-EPBX 3000 UNIT/ML IJ SOLN: 3000.0000 [IU] | Freq: Once | INTRAMUSCULAR | Status: DC

## 2021-01-18 DIAGNOSIS — D638 Anemia in other chronic diseases classified elsewhere: Secondary | ICD-10-CM | POA: Diagnosis not present

## 2021-01-18 DIAGNOSIS — N189 Chronic kidney disease, unspecified: Secondary | ICD-10-CM | POA: Diagnosis not present

## 2021-01-18 DIAGNOSIS — R809 Proteinuria, unspecified: Secondary | ICD-10-CM | POA: Diagnosis not present

## 2021-01-18 DIAGNOSIS — E1129 Type 2 diabetes mellitus with other diabetic kidney complication: Secondary | ICD-10-CM | POA: Diagnosis not present

## 2021-01-18 DIAGNOSIS — I129 Hypertensive chronic kidney disease with stage 1 through stage 4 chronic kidney disease, or unspecified chronic kidney disease: Secondary | ICD-10-CM | POA: Diagnosis not present

## 2021-01-18 DIAGNOSIS — E1122 Type 2 diabetes mellitus with diabetic chronic kidney disease: Secondary | ICD-10-CM | POA: Diagnosis not present

## 2021-01-23 ENCOUNTER — Encounter (HOSPITAL_COMMUNITY)
Admission: RE | Admit: 2021-01-23 | Discharge: 2021-01-23 | Disposition: A | Discharge status: Home / Self Care | Payer: Medicare PPO | Source: Ambulatory Visit | Attending: Nephrology | Admitting: Nephrology

## 2021-01-23 ENCOUNTER — Encounter (HOSPITAL_COMMUNITY): Payer: Self-pay

## 2021-01-23 ENCOUNTER — Other Ambulatory Visit: Payer: Self-pay

## 2021-01-23 DIAGNOSIS — N1832 Chronic kidney disease, stage 3b: Secondary | ICD-10-CM | POA: Diagnosis not present

## 2021-01-23 LAB — POCT HEMOGLOBIN-HEMACUE: Hemoglobin: 9.9 g/dL — ABNORMAL LOW (ref 12.0–15.0)

## 2021-01-23 MED ORDER — EPOETIN ALFA-EPBX 3000 UNIT/ML IJ SOLN
INTRAMUSCULAR | Status: AC
  Administered 2021-01-23: 3000 [IU] via SUBCUTANEOUS
  Filled 2021-01-23: qty 1

## 2021-01-23 MED ORDER — EPOETIN ALFA-EPBX 3000 UNIT/ML IJ SOLN: 3000.0000 [IU] | Freq: Once | INTRAMUSCULAR | Status: AC

## 2021-01-24 ENCOUNTER — Ambulatory Visit: Payer: Medicare PPO | Admitting: Family Medicine

## 2021-01-31 DIAGNOSIS — E1165 Type 2 diabetes mellitus with hyperglycemia: Secondary | ICD-10-CM | POA: Diagnosis not present

## 2021-01-31 DIAGNOSIS — R351 Nocturia: Secondary | ICD-10-CM | POA: Diagnosis not present

## 2021-01-31 DIAGNOSIS — M7989 Other specified soft tissue disorders: Secondary | ICD-10-CM | POA: Diagnosis not present

## 2021-02-07 ENCOUNTER — Encounter (HOSPITAL_COMMUNITY): Admission: RE | Admit: 2021-02-07 | Payer: Medicare PPO | Source: Ambulatory Visit

## 2021-02-14 ENCOUNTER — Encounter (HOSPITAL_COMMUNITY): Payer: Self-pay

## 2021-02-14 ENCOUNTER — Other Ambulatory Visit: Payer: Self-pay

## 2021-02-14 ENCOUNTER — Encounter (HOSPITAL_COMMUNITY)
Admission: RE | Admit: 2021-02-14 | Discharge: 2021-02-14 | Disposition: A | Discharge status: Home / Self Care | Payer: Medicare PPO | Source: Ambulatory Visit | Attending: Nephrology | Admitting: Nephrology

## 2021-02-14 DIAGNOSIS — D631 Anemia in chronic kidney disease: Secondary | ICD-10-CM | POA: Insufficient documentation

## 2021-02-14 DIAGNOSIS — N1832 Chronic kidney disease, stage 3b: Secondary | ICD-10-CM | POA: Insufficient documentation

## 2021-02-14 LAB — POCT HEMOGLOBIN-HEMACUE: Hemoglobin: 10.6 g/dL — ABNORMAL LOW (ref 12.0–15.0)

## 2021-02-14 MED ORDER — EPOETIN ALFA-EPBX 3000 UNIT/ML IJ SOLN: 3000.0000 [IU] | Freq: Once | INTRAMUSCULAR | Status: DC

## 2021-02-28 ENCOUNTER — Encounter (HOSPITAL_COMMUNITY): Payer: Self-pay

## 2021-02-28 ENCOUNTER — Other Ambulatory Visit: Payer: Self-pay

## 2021-02-28 ENCOUNTER — Encounter (HOSPITAL_COMMUNITY)
Admission: RE | Admit: 2021-02-28 | Discharge: 2021-02-28 | Disposition: A | Discharge status: Home / Self Care | Payer: Medicare PPO | Source: Ambulatory Visit | Attending: Nephrology | Admitting: Nephrology

## 2021-02-28 DIAGNOSIS — N1832 Chronic kidney disease, stage 3b: Secondary | ICD-10-CM | POA: Insufficient documentation

## 2021-02-28 MED ORDER — EPOETIN ALFA-EPBX 3000 UNIT/ML IJ SOLN: 3000.0000 [IU] | Freq: Once | INTRAMUSCULAR | Status: DC

## 2021-03-01 DIAGNOSIS — M7989 Other specified soft tissue disorders: Secondary | ICD-10-CM | POA: Diagnosis not present

## 2021-03-01 DIAGNOSIS — M3501 Sicca syndrome with keratoconjunctivitis: Secondary | ICD-10-CM | POA: Diagnosis not present

## 2021-03-01 DIAGNOSIS — R627 Adult failure to thrive: Secondary | ICD-10-CM | POA: Diagnosis not present

## 2021-03-01 DIAGNOSIS — M0579 Rheumatoid arthritis with rheumatoid factor of multiple sites without organ or systems involvement: Secondary | ICD-10-CM | POA: Diagnosis not present

## 2021-03-01 DIAGNOSIS — I10 Essential (primary) hypertension: Secondary | ICD-10-CM | POA: Diagnosis not present

## 2021-03-01 DIAGNOSIS — R5383 Other fatigue: Secondary | ICD-10-CM | POA: Diagnosis not present

## 2021-03-09 LAB — POCT HEMOGLOBIN-HEMACUE: Hemoglobin: 10.2 g/dL — ABNORMAL LOW (ref 12.0–15.0)

## 2021-03-13 IMAGING — MR MR HEAD W/O CM
12 series · 48 of 48 positions shown · non-contrast
Comparison: CT head April 23, 2014.

CLINICAL DATA: Memory loss.

EXAM:
MRI HEAD WITHOUT CONTRAST
TECHNIQUE: Multiplanar, multiecho pulse sequences of the brain and surrounding
structures were obtained without intravenous contrast.

[Series 5: DWI · axial · 3.0mm · 0.77mm/px · z∈[-14,+132]mm · 3 of 50 slices shown (1 of 4)]
[im 1/50]
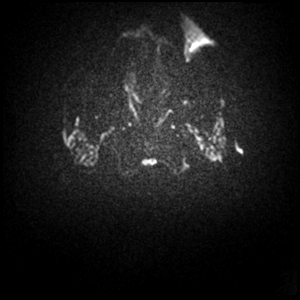
[im 25/50]
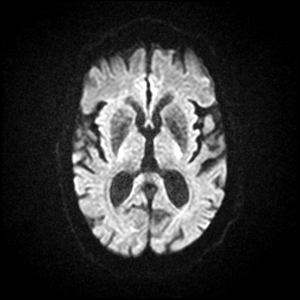
[im 50/50]
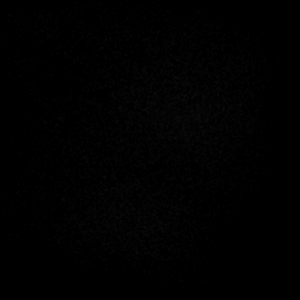

[Series 6: DWI · axial · 3.0mm · 0.77mm/px · z∈[-14,+129]mm · 4 of 49 slices shown (2 of 4)]
[im 1/49]
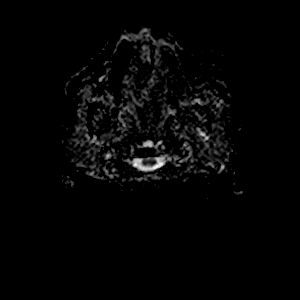
[im 17/49]
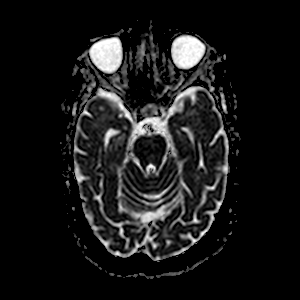
[im 33/49]
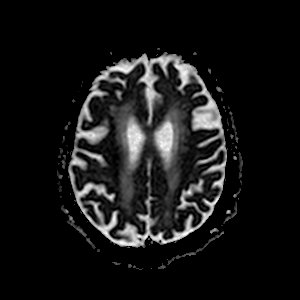
[im 49/49]
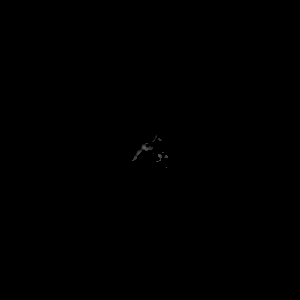

[Series 7: DWI · coronal · 5.0mm · 0.88mm/px · 2 of 28 slices shown (3 of 4)]
[im 1/28]
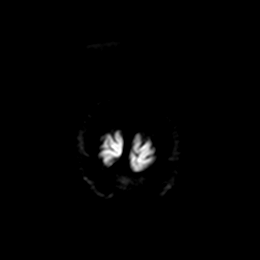
[im 28/28]
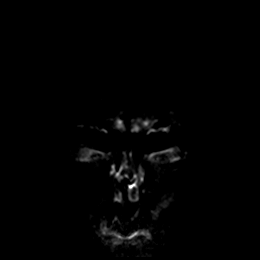

[Series 8: DWI · coronal · 5.0mm · 0.88mm/px · 2 of 28 slices shown (4 of 4)]
[im 1/28]
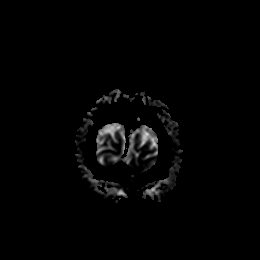
[im 28/28]
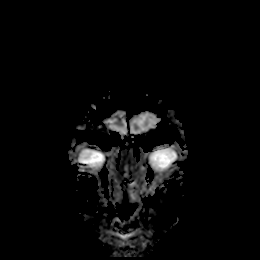

[Series 9: T1 · sagittal · 5.0mm · 0.75mm/px · 2 of 21 slices shown (1 of 2)]
[im 1/21]
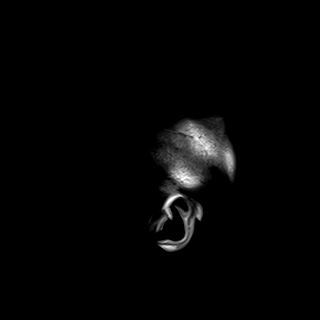
[im 21/21]
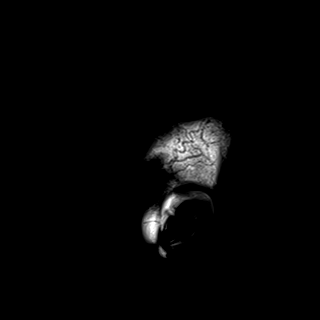

[Series 10: T2 · axial · 5.0mm · 0.72mm/px · z∈[-17,+136]mm · 2 of 23 slices shown (1 of 2)]
[im 1/23]
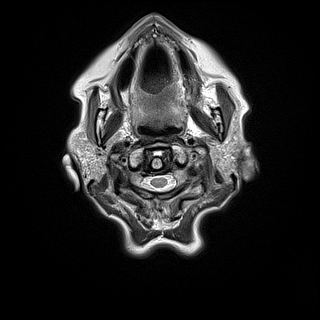
[im 23/23]
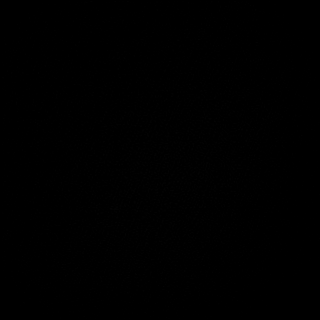

[Series 11: mag_images · axial · 3.0mm · 0.90mm/px · z∈[-31,+145]mm · 5 of 60 slices shown]
[im 1/60]
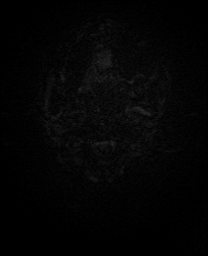
[im 15/60]
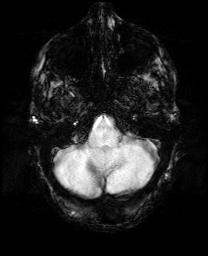
[im 30/60]
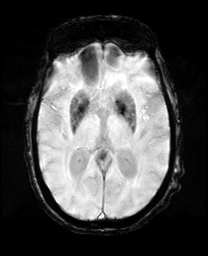
[im 45/60]
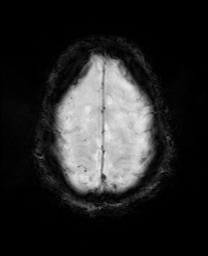
[im 60/60]
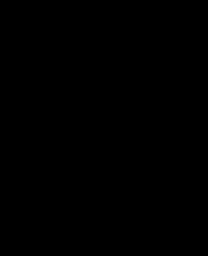

[Series 12: pha_images · axial · 3.0mm · 0.90mm/px · z∈[-28,+145]mm · 4 of 58 slices shown]
[im 1/58]
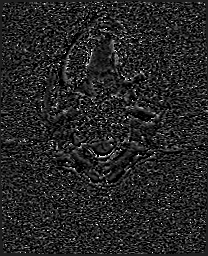
[im 20/58]
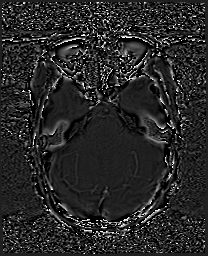
[im 39/58]
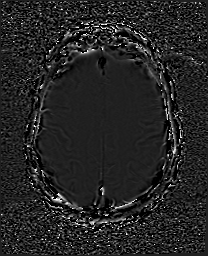
[im 58/58]
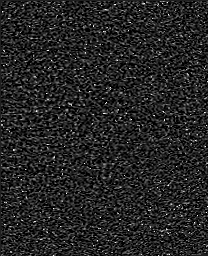

[Series 13: swi_images · axial · 3.0mm · 0.90mm/px · z∈[-31,+145]mm · 5 of 60 slices shown]
[im 1/60]
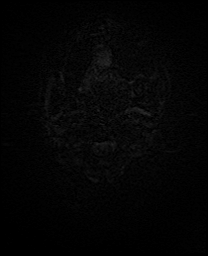
[im 15/60]
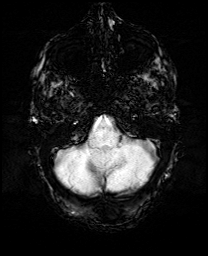
[im 30/60]
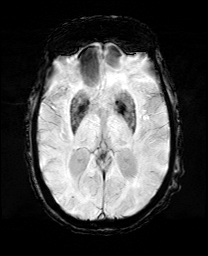
[im 45/60]
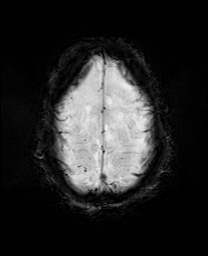
[im 60/60]
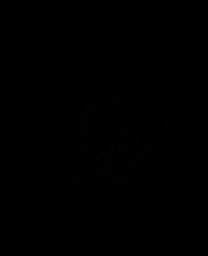

[Series 15: FLAIR · axial · 3.0mm · 0.45mm/px · z∈[-16,+130]mm · 4 of 50 slices shown]
[im 1/50]
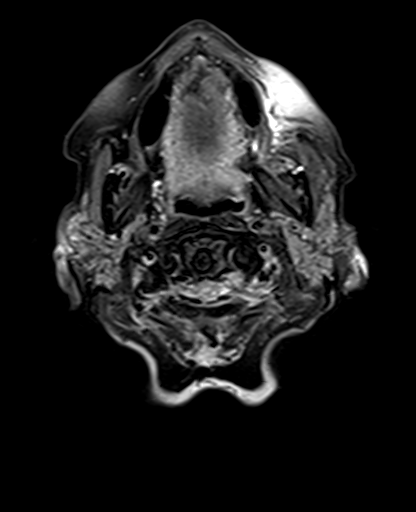
[im 17/50]
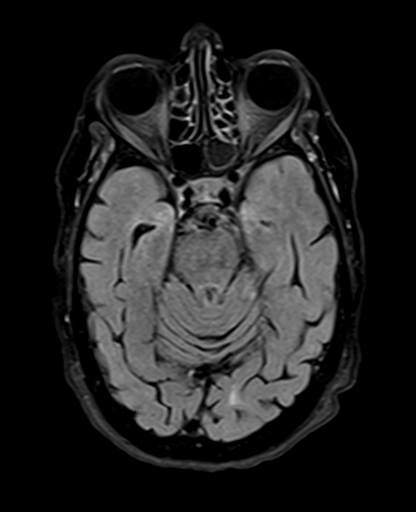
[im 33/50]
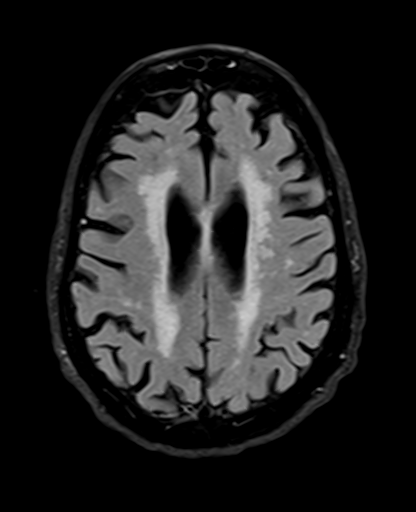
[im 50/50]
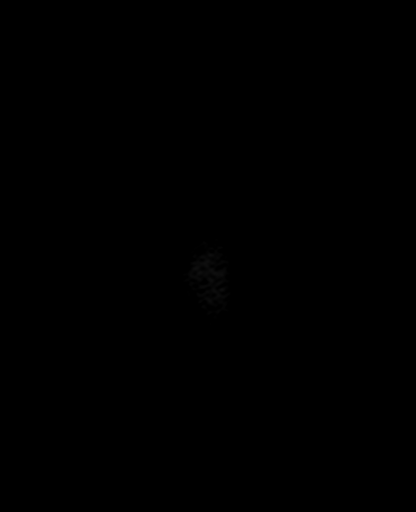

[Series 16: T1 · axial · 1.0mm · 0.98mm/px · z∈[-28,+144]mm · 13 of 172 slices shown (2 of 2)]
[im 1/172]
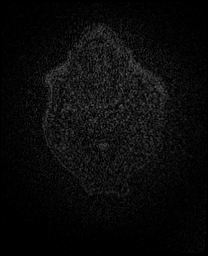
[im 15/172]
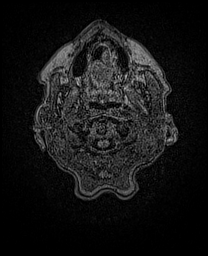
[im 29/172]
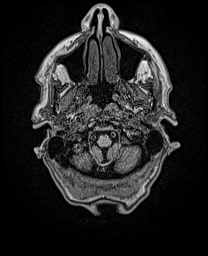
[im 43/172]
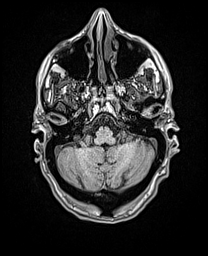
[im 58/172]
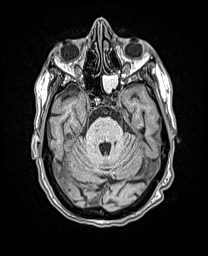
[im 72/172]
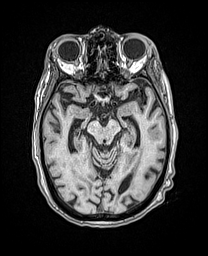
[im 86/172]
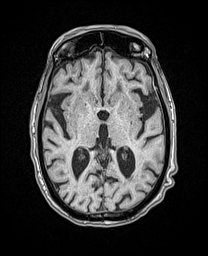
[im 100/172]
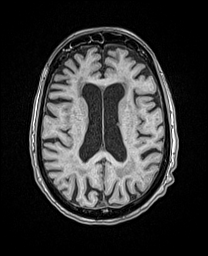
[im 115/172]
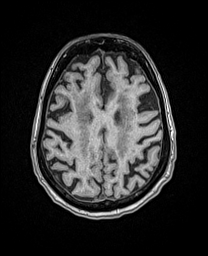
[im 129/172]
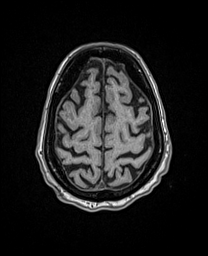
[im 143/172]
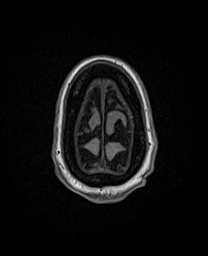
[im 157/172]
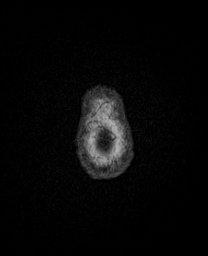
[im 172/172]
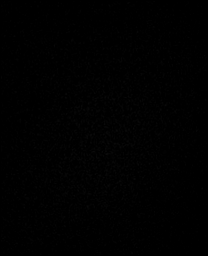

[Series 17: T2 · coronal · 5.0mm · 0.72mm/px · 2 of 28 slices shown (2 of 2)]
[im 1/28]
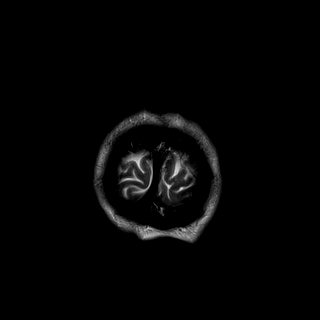
[im 28/28]
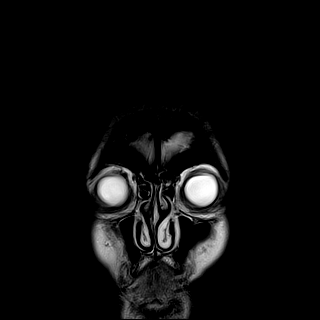

[48 of 48 positions shown; findings below may reference images not displayed]

FINDINGS: Brain: No acute infarction, hemorrhage, hydrocephalus, extra-axial
collection or mass lesion. Moderate diffuse cerebral volume loss
with ex vacuo ventricular dilation. Scattered T2/FLAIR
hyperintensities within the white matter, compatible with chronic
microvascular ischemic disease. Tiny remote right cerebellar lacunar
infarct.

Vascular: Major arterial flow voids are maintained at the skull
base.

Skull and upper cervical spine: Normal marrow signal.

Sinuses/Orbits: Mucosal thickening and fluid within the left
sphenoid sinus. Unremarkable orbits.

Other: No mastoid effusions.
IMPRESSION: 1. No acute intracranial abnormality. No reversible causes of memory
loss identified.
2. Moderate diffuse cerebral volume loss and chronic microvascular
ischemic disease.

## 2021-03-14 ENCOUNTER — Other Ambulatory Visit: Payer: Self-pay

## 2021-03-14 ENCOUNTER — Encounter (HOSPITAL_COMMUNITY): Payer: Self-pay

## 2021-03-14 ENCOUNTER — Encounter (HOSPITAL_COMMUNITY)
Admission: RE | Admit: 2021-03-14 | Discharge: 2021-03-14 | Disposition: A | Discharge status: Home / Self Care | Payer: Medicare PPO | Source: Ambulatory Visit | Attending: Nephrology | Admitting: Nephrology

## 2021-03-14 DIAGNOSIS — N1832 Chronic kidney disease, stage 3b: Secondary | ICD-10-CM | POA: Diagnosis not present

## 2021-03-14 LAB — IRON AND TIBC
Iron: 97 ug/dL (ref 28–170)
Saturation Ratios: 28 % (ref 10.4–31.8)
TIBC: 352 ug/dL (ref 250–450)
UIBC: 255 ug/dL

## 2021-03-14 LAB — CBC WITH DIFFERENTIAL/PLATELET
Abs Immature Granulocytes: 0.02 10*3/uL (ref 0.00–0.07)
Basophils Absolute: 0.1 10*3/uL (ref 0.0–0.1)
Basophils Relative: 1 %
Eosinophils Absolute: 0 10*3/uL (ref 0.0–0.5)
Eosinophils Relative: 1 %
HCT: 37.3 % (ref 36.0–46.0)
Hemoglobin: 10.8 g/dL — ABNORMAL LOW (ref 12.0–15.0)
Immature Granulocytes: 0 %
Lymphocytes Relative: 24 %
Lymphs Abs: 1.7 10*3/uL (ref 0.7–4.0)
MCH: 22.5 pg — ABNORMAL LOW (ref 26.0–34.0)
MCHC: 29 g/dL — ABNORMAL LOW (ref 30.0–36.0)
MCV: 77.5 fL — ABNORMAL LOW (ref 80.0–100.0)
Monocytes Absolute: 0.5 10*3/uL (ref 0.1–1.0)
Monocytes Relative: 6 %
Neutro Abs: 4.9 10*3/uL (ref 1.7–7.7)
Neutrophils Relative %: 68 %
Platelets: 445 10*3/uL — ABNORMAL HIGH (ref 150–400)
RBC: 4.81 MIL/uL (ref 3.87–5.11)
RDW: 16.9 % — ABNORMAL HIGH (ref 11.5–15.5)
WBC: 7.2 10*3/uL (ref 4.0–10.5)
nRBC: 0 % (ref 0.0–0.2)

## 2021-03-14 LAB — RENAL FUNCTION PANEL
Albumin: 3.4 g/dL — ABNORMAL LOW (ref 3.5–5.0)
Anion gap: 10 (ref 5–15)
BUN: 51 mg/dL — ABNORMAL HIGH (ref 8–23)
CO2: 25 mmol/L (ref 22–32)
Calcium: 9.9 mg/dL (ref 8.9–10.3)
Chloride: 105 mmol/L (ref 98–111)
Creatinine, Ser: 1.58 mg/dL — ABNORMAL HIGH (ref 0.44–1.00)
GFR, Estimated: 33 mL/min — ABNORMAL LOW (ref >60)
Glucose, Bld: 106 mg/dL — ABNORMAL HIGH (ref 70–99)
Phosphorus: 3.6 mg/dL (ref 2.5–4.6)
Potassium: 4.8 mmol/L (ref 3.5–5.1)
Sodium: 140 mmol/L (ref 135–145)

## 2021-03-14 LAB — POCT HEMOGLOBIN-HEMACUE: Hemoglobin: 11.2 g/dL — ABNORMAL LOW (ref 12.0–15.0)

## 2021-03-14 MED ORDER — EPOETIN ALFA-EPBX 3000 UNIT/ML IJ SOLN
3000.0000 [IU] | Freq: Once | INTRAMUSCULAR | Status: DC
Start: 2021-03-14 — End: 2021-03-14

## 2021-03-15 LAB — PTH, INTACT AND CALCIUM
Calcium, Total (PTH): 10 mg/dL (ref 8.7–10.3)
PTH: 39 pg/mL (ref 15–65)

## 2021-03-17 DIAGNOSIS — I70209 Unspecified atherosclerosis of native arteries of extremities, unspecified extremity: Secondary | ICD-10-CM | POA: Diagnosis not present

## 2021-03-17 DIAGNOSIS — N1832 Chronic kidney disease, stage 3b: Secondary | ICD-10-CM | POA: Diagnosis not present

## 2021-03-17 DIAGNOSIS — F339 Major depressive disorder, recurrent, unspecified: Secondary | ICD-10-CM | POA: Diagnosis not present

## 2021-03-17 DIAGNOSIS — N2581 Secondary hyperparathyroidism of renal origin: Secondary | ICD-10-CM | POA: Diagnosis not present

## 2021-03-17 DIAGNOSIS — E278 Other specified disorders of adrenal gland: Secondary | ICD-10-CM | POA: Diagnosis not present

## 2021-03-17 DIAGNOSIS — D84821 Immunodeficiency due to drugs: Secondary | ICD-10-CM | POA: Diagnosis not present

## 2021-03-17 DIAGNOSIS — G309 Alzheimer's disease, unspecified: Secondary | ICD-10-CM | POA: Diagnosis not present

## 2021-03-17 DIAGNOSIS — E785 Hyperlipidemia, unspecified: Secondary | ICD-10-CM | POA: Diagnosis not present

## 2021-03-17 DIAGNOSIS — E1151 Type 2 diabetes mellitus with diabetic peripheral angiopathy without gangrene: Secondary | ICD-10-CM | POA: Diagnosis not present

## 2021-03-22 DIAGNOSIS — M3500 Sicca syndrome, unspecified: Secondary | ICD-10-CM | POA: Diagnosis not present

## 2021-03-22 DIAGNOSIS — Z681 Body mass index (BMI) 19 or less, adult: Secondary | ICD-10-CM | POA: Diagnosis not present

## 2021-03-22 DIAGNOSIS — M0579 Rheumatoid arthritis with rheumatoid factor of multiple sites without organ or systems involvement: Secondary | ICD-10-CM | POA: Diagnosis not present

## 2021-03-22 DIAGNOSIS — E1165 Type 2 diabetes mellitus with hyperglycemia: Secondary | ICD-10-CM | POA: Diagnosis not present

## 2021-03-22 DIAGNOSIS — R5382 Chronic fatigue, unspecified: Secondary | ICD-10-CM | POA: Diagnosis not present

## 2021-03-22 DIAGNOSIS — R682 Dry mouth, unspecified: Secondary | ICD-10-CM | POA: Diagnosis not present

## 2021-03-22 DIAGNOSIS — R627 Adult failure to thrive: Secondary | ICD-10-CM | POA: Diagnosis not present

## 2021-03-28 ENCOUNTER — Encounter (HOSPITAL_COMMUNITY)
Admission: RE | Admit: 2021-03-28 | Discharge: 2021-03-28 | Disposition: A | Discharge status: Home / Self Care | Payer: Medicare PPO | Source: Ambulatory Visit | Attending: Nephrology | Admitting: Nephrology

## 2021-03-28 ENCOUNTER — Encounter (HOSPITAL_COMMUNITY): Payer: Self-pay

## 2021-03-28 ENCOUNTER — Other Ambulatory Visit: Payer: Self-pay

## 2021-03-28 DIAGNOSIS — D631 Anemia in chronic kidney disease: Secondary | ICD-10-CM | POA: Insufficient documentation

## 2021-03-28 DIAGNOSIS — N1832 Chronic kidney disease, stage 3b: Secondary | ICD-10-CM | POA: Insufficient documentation

## 2021-03-28 LAB — POCT HEMOGLOBIN-HEMACUE: Hemoglobin: 10.3 g/dL — ABNORMAL LOW (ref 12.0–15.0)

## 2021-03-28 MED ORDER — EPOETIN ALFA-EPBX 3000 UNIT/ML IJ SOLN: 3000.0000 [IU] | Freq: Once | INTRAMUSCULAR | Status: DC

## 2021-04-11 ENCOUNTER — Encounter (HOSPITAL_COMMUNITY)
Admission: RE | Admit: 2021-04-11 | Discharge: 2021-04-11 | Disposition: A | Discharge status: Home / Self Care | Payer: Medicare PPO | Source: Ambulatory Visit | Attending: Nephrology | Admitting: Nephrology

## 2021-04-11 ENCOUNTER — Other Ambulatory Visit: Payer: Self-pay

## 2021-04-11 DIAGNOSIS — N1832 Chronic kidney disease, stage 3b: Secondary | ICD-10-CM | POA: Diagnosis not present

## 2021-04-11 LAB — CBC WITH DIFFERENTIAL/PLATELET
Abs Immature Granulocytes: 0.02 10*3/uL (ref 0.00–0.07)
Basophils Absolute: 0 10*3/uL (ref 0.0–0.1)
Basophils Relative: 0 %
Eosinophils Absolute: 0.1 10*3/uL (ref 0.0–0.5)
Eosinophils Relative: 2 %
HCT: 33 % — ABNORMAL LOW (ref 36.0–46.0)
Hemoglobin: 9.9 g/dL — ABNORMAL LOW (ref 12.0–15.0)
Immature Granulocytes: 0 %
Lymphocytes Relative: 28 %
Lymphs Abs: 1.9 10*3/uL (ref 0.7–4.0)
MCH: 23 pg — ABNORMAL LOW (ref 26.0–34.0)
MCHC: 30 g/dL (ref 30.0–36.0)
MCV: 76.6 fL — ABNORMAL LOW (ref 80.0–100.0)
Monocytes Absolute: 0.5 10*3/uL (ref 0.1–1.0)
Monocytes Relative: 8 %
Neutro Abs: 4.1 10*3/uL (ref 1.7–7.7)
Neutrophils Relative %: 62 %
Platelets: 349 10*3/uL (ref 150–400)
RBC: 4.31 MIL/uL (ref 3.87–5.11)
RDW: 17 % — ABNORMAL HIGH (ref 11.5–15.5)
WBC: 6.7 10*3/uL (ref 4.0–10.5)
nRBC: 0 % (ref 0.0–0.2)

## 2021-04-11 LAB — IRON AND TIBC
Iron: 52 ug/dL (ref 28–170)
Saturation Ratios: 17 % (ref 10.4–31.8)
TIBC: 309 ug/dL (ref 250–450)
UIBC: 257 ug/dL

## 2021-04-11 LAB — RENAL FUNCTION PANEL
Albumin: 3.2 g/dL — ABNORMAL LOW (ref 3.5–5.0)
Anion gap: 9 (ref 5–15)
BUN: 39 mg/dL — ABNORMAL HIGH (ref 8–23)
CO2: 24 mmol/L (ref 22–32)
Calcium: 9.6 mg/dL (ref 8.9–10.3)
Chloride: 106 mmol/L (ref 98–111)
Creatinine, Ser: 1.22 mg/dL — ABNORMAL HIGH (ref 0.44–1.00)
GFR, Estimated: 45 mL/min — ABNORMAL LOW (ref >60)
Glucose, Bld: 82 mg/dL (ref 70–99)
Phosphorus: 3.5 mg/dL (ref 2.5–4.6)
Potassium: 4.4 mmol/L (ref 3.5–5.1)
Sodium: 139 mmol/L (ref 135–145)

## 2021-04-11 LAB — POCT HEMOGLOBIN-HEMACUE: Hemoglobin: 10.4 g/dL — ABNORMAL LOW (ref 12.0–15.0)

## 2021-04-11 MED ORDER — EPOETIN ALFA-EPBX 3000 UNIT/ML IJ SOLN: 3000.0000 [IU] | Freq: Once | INTRAMUSCULAR | Status: DC

## 2021-04-11 NOTE — Progress Notes (Signed)
Unable to void for urine specimen today. Hemacue 10.4  No injection required. Monthly labs sent to lab

## 2021-04-12 LAB — PTH, INTACT AND CALCIUM
Calcium, Total (PTH): 9.7 mg/dL (ref 8.7–10.3)
PTH: 28 pg/mL (ref 15–65)

## 2021-04-15 ENCOUNTER — Other Ambulatory Visit: Payer: Self-pay | Admitting: Family Medicine

## 2021-04-16 ENCOUNTER — Other Ambulatory Visit: Payer: Self-pay | Admitting: Family Medicine

## 2021-04-25 ENCOUNTER — Encounter (HOSPITAL_COMMUNITY): Admission: RE | Admit: 2021-04-25 | Payer: Medicare PPO | Source: Ambulatory Visit

## 2021-05-02 ENCOUNTER — Encounter (HOSPITAL_COMMUNITY)
Admission: RE | Admit: 2021-05-02 | Discharge: 2021-05-02 | Disposition: A | Discharge status: Home / Self Care | Payer: Medicare PPO | Source: Ambulatory Visit | Attending: Nephrology | Admitting: Nephrology

## 2021-05-10 ENCOUNTER — Other Ambulatory Visit: Payer: Self-pay | Admitting: Family Medicine

## 2021-05-18 ENCOUNTER — Encounter (HOSPITAL_COMMUNITY): Payer: Self-pay

## 2021-05-18 ENCOUNTER — Encounter (HOSPITAL_COMMUNITY)
Admission: RE | Admit: 2021-05-18 | Discharge: 2021-05-18 | Disposition: A | Discharge status: Home / Self Care | Payer: Medicare PPO | Source: Ambulatory Visit | Attending: Nephrology | Admitting: Nephrology

## 2021-05-18 ENCOUNTER — Other Ambulatory Visit: Payer: Self-pay

## 2021-05-18 DIAGNOSIS — N1832 Chronic kidney disease, stage 3b: Secondary | ICD-10-CM | POA: Insufficient documentation

## 2021-05-18 LAB — RENAL FUNCTION PANEL
Albumin: 3.2 g/dL — ABNORMAL LOW (ref 3.5–5.0)
Anion gap: 8 (ref 5–15)
BUN: 35 mg/dL — ABNORMAL HIGH (ref 8–23)
CO2: 25 mmol/L (ref 22–32)
Calcium: 9.5 mg/dL (ref 8.9–10.3)
Chloride: 105 mmol/L (ref 98–111)
Creatinine, Ser: 1.28 mg/dL — ABNORMAL HIGH (ref 0.44–1.00)
GFR, Estimated: 42 mL/min — ABNORMAL LOW (ref >60)
Glucose, Bld: 191 mg/dL — ABNORMAL HIGH (ref 70–99)
Phosphorus: 3 mg/dL (ref 2.5–4.6)
Potassium: 4.3 mmol/L (ref 3.5–5.1)
Sodium: 138 mmol/L (ref 135–145)

## 2021-05-18 LAB — IRON AND TIBC
Iron: 68 ug/dL (ref 28–170)
Saturation Ratios: 23 % (ref 10.4–31.8)
TIBC: 299 ug/dL (ref 250–450)
UIBC: 231 ug/dL

## 2021-05-18 LAB — POCT HEMOGLOBIN-HEMACUE: Hemoglobin: 11.1 g/dL — ABNORMAL LOW (ref 12.0–15.0)

## 2021-05-18 MED ORDER — EPOETIN ALFA-EPBX 3000 UNIT/ML IJ SOLN: 3000.0000 [IU] | Freq: Once | INTRAMUSCULAR | Status: DC

## 2021-05-19 LAB — PTH, INTACT AND CALCIUM
Calcium, Total (PTH): 9.7 mg/dL (ref 8.7–10.3)
PTH: 23 pg/mL (ref 15–65)

## 2021-06-01 ENCOUNTER — Encounter (HOSPITAL_COMMUNITY)
Admission: RE | Admit: 2021-06-01 | Discharge: 2021-06-01 | Disposition: A | Discharge status: Home / Self Care | Payer: Medicare PPO | Source: Ambulatory Visit | Attending: Nephrology | Admitting: Nephrology

## 2021-06-30 ENCOUNTER — Other Ambulatory Visit (HOSPITAL_BASED_OUTPATIENT_CLINIC_OR_DEPARTMENT_OTHER): Payer: Self-pay

## 2021-06-30 DIAGNOSIS — G4710 Hypersomnia, unspecified: Secondary | ICD-10-CM

## 2021-06-30 DIAGNOSIS — R5383 Other fatigue: Secondary | ICD-10-CM

## 2021-07-03 ENCOUNTER — Other Ambulatory Visit: Payer: Self-pay

## 2021-07-03 ENCOUNTER — Ambulatory Visit: Payer: Medicare PPO | Admitting: Neurology

## 2021-07-07 ENCOUNTER — Other Ambulatory Visit (HOSPITAL_COMMUNITY)
Admission: RE | Admit: 2021-07-07 | Discharge: 2021-07-07 | Disposition: A | Discharge status: Home / Self Care | Payer: Medicare PPO | Source: Ambulatory Visit | Attending: Nephrology | Admitting: Nephrology

## 2021-07-07 DIAGNOSIS — R600 Localized edema: Secondary | ICD-10-CM | POA: Diagnosis present

## 2021-07-07 DIAGNOSIS — N189 Chronic kidney disease, unspecified: Secondary | ICD-10-CM | POA: Insufficient documentation

## 2021-07-07 DIAGNOSIS — E1129 Type 2 diabetes mellitus with other diabetic kidney complication: Secondary | ICD-10-CM | POA: Insufficient documentation

## 2021-07-07 DIAGNOSIS — E1122 Type 2 diabetes mellitus with diabetic chronic kidney disease: Secondary | ICD-10-CM | POA: Insufficient documentation

## 2021-07-07 DIAGNOSIS — D638 Anemia in other chronic diseases classified elsewhere: Secondary | ICD-10-CM | POA: Insufficient documentation

## 2021-07-07 DIAGNOSIS — I129 Hypertensive chronic kidney disease with stage 1 through stage 4 chronic kidney disease, or unspecified chronic kidney disease: Secondary | ICD-10-CM | POA: Diagnosis present

## 2021-07-07 DIAGNOSIS — R809 Proteinuria, unspecified: Secondary | ICD-10-CM | POA: Insufficient documentation

## 2021-07-07 LAB — RENAL FUNCTION PANEL
Albumin: 3.1 g/dL — ABNORMAL LOW (ref 3.5–5.0)
Anion gap: 5 (ref 5–15)
BUN: 26 mg/dL — ABNORMAL HIGH (ref 8–23)
CO2: 26 mmol/L (ref 22–32)
Calcium: 9.3 mg/dL (ref 8.9–10.3)
Chloride: 105 mmol/L (ref 98–111)
Creatinine, Ser: 1.14 mg/dL — ABNORMAL HIGH (ref 0.44–1.00)
GFR, Estimated: 48 mL/min — ABNORMAL LOW (ref >60)
Glucose, Bld: 177 mg/dL — ABNORMAL HIGH (ref 70–99)
Phosphorus: 2.9 mg/dL (ref 2.5–4.6)
Potassium: 4.8 mmol/L (ref 3.5–5.1)
Sodium: 136 mmol/L (ref 135–145)

## 2021-07-07 LAB — PROTEIN / CREATININE RATIO, URINE
Creatinine, Urine: 39.3 mg/dL
Protein Creatinine Ratio: 4.4 mg/mg{Cre} — ABNORMAL HIGH (ref 0.00–0.15)
Total Protein, Urine: 173 mg/dL

## 2021-07-07 LAB — CBC
HCT: 35.9 % — ABNORMAL LOW (ref 36.0–46.0)
Hemoglobin: 10.8 g/dL — ABNORMAL LOW (ref 12.0–15.0)
MCH: 22.9 pg — ABNORMAL LOW (ref 26.0–34.0)
MCHC: 30.1 g/dL (ref 30.0–36.0)
MCV: 76.1 fL — ABNORMAL LOW (ref 80.0–100.0)
Platelets: 317 10*3/uL (ref 150–400)
RBC: 4.72 MIL/uL (ref 3.87–5.11)
RDW: 14.5 % (ref 11.5–15.5)
WBC: 7.5 10*3/uL (ref 4.0–10.5)
nRBC: 0 % (ref 0.0–0.2)

## 2021-07-17 ENCOUNTER — Ambulatory Visit: Payer: Medicare PPO | Attending: Family | Admitting: Neurology

## 2021-07-17 ENCOUNTER — Other Ambulatory Visit: Payer: Self-pay

## 2021-07-17 DIAGNOSIS — G4710 Hypersomnia, unspecified: Secondary | ICD-10-CM | POA: Diagnosis present

## 2021-07-17 DIAGNOSIS — R5383 Other fatigue: Secondary | ICD-10-CM | POA: Insufficient documentation

## 2021-07-21 NOTE — Procedures (Signed)
Montcalm A. Merlene Laughter, MD     [email or web address]             NOCTURNAL POLYSOMNOGRAPHY   LOCATION: [facility]  Patient Name: Wendy [name], Wendy [name] Date: 07/17/2021 Gender: Female D.O.B: 1941-11-16 Age (years): 30 Referring Provider: Novella Rob FNP Height (inches): 69 Interpreting Physician: Phillips Odor MD, ABSM Weight (lbs): 125 RPSGT: Rosebud Poles BMI: 18 MRN: [id number] Neck Size: 14.00 CLINICAL INFORMATION Sleep Study Type: NPSG     Indication for sleep study: Excessive Daytime Sleepiness, Fatigue     Epworth Sleepiness Score: 18     SLEEP STUDY TECHNIQUE As per the AASM Manual for the Scoring of Sleep and Associated Events v2.3 (April 2016) with a hypopnea requiring 4% desaturations.  The channels recorded and monitored were frontal, central and occipital EEG, electrooculogram (EOG), submentalis EMG (chin), nasal and oral airflow, thoracic and abdominal wall motion, anterior tibialis EMG, snore microphone, electrocardiogram, and pulse oximetry.  MEDICATIONS Medications self-administered by patient taken the night of the study : N/A  Current Outpatient Medications:    aspirin EC 81 MG tablet, Take 81 mg by mouth daily., Disp: , Rfl:    cilostazol (PLETAL) 100 MG tablet, Take 1 tablet (100 mg total) by mouth daily., Disp: 90 tablet, Rfl: 3   cloNIDine (CATAPRES - DOSED IN MG/24 HR) 0.2 mg/24hr patch, Place 1 patch (0.2 mg total) onto the skin once a week., Disp: 4 patch, Rfl: 0   cycloSPORINE (RESTASIS) 0.05 % ophthalmic emulsion, Place 1 drop into both eyes 2 (two) times daily., Disp: 0.4 mL, Rfl: 5   diclofenac Sodium (VOLTAREN) 1 % GEL, Apply 2 g topically daily., Disp: , Rfl:    epoetin alfa-epbx (RETACRIT) 3000 UNIT/ML injection, 3,000 Units every 14 (fourteen) days., Disp: , Rfl:    folic acid (FOLVITE) 1 MG tablet, Take 1 tablet (1 mg total) by mouth daily., Disp: 90 tablet, Rfl: 3   hydrochlorothiazide (HYDRODIURIL) 12.5 MG  tablet, Take 1 tablet (12.5 mg total) by mouth daily., Disp: 30 tablet, Rfl: 4   meclizine (ANTIVERT) 25 MG tablet, TAKE 1 TABLET BY MOUTH THREE TIMES DAILY AS NEEDED FOR DIZZINESS (Patient not taking: Reported on 10/05/2020), Disp: 30 tablet, Rfl: 0   memantine (NAMENDA) 10 MG tablet, Take 1 tablet (10 mg total) by mouth in the morning and at bedtime., Disp: 90 tablet, Rfl: 3   metFORMIN (GLUCOPHAGE) 1000 MG tablet, Take 0.5 tablets (500 mg total) by mouth 2 (two) times daily with a meal. Renal dose, Disp: 180 tablet, Rfl: 1   Omega-3 Fatty Acids (FISH OIL) 1000 MG CAPS, Take 1,000 mg by mouth daily., Disp: , Rfl:    rivastigmine (EXELON) 9.5 mg/24hr, APPLY 1 PATCH TOPICALLY ONCE DAILY, Disp: 30 patch, Rfl: 2   senna (SENOKOT) 8.6 MG tablet, Take 1 tablet by mouth daily., Disp: , Rfl:      SLEEP ARCHITECTURE The study was initiated at 10:48:34 PM and ended at 5:16:08 AM.  Sleep onset time was 5.8 minutes and the sleep efficiency was 65.2%. The total sleep time was 252.5 minutes.  Stage REM latency was 41.0 minutes.  The patient spent 6.34% of the night in stage N1 sleep, 55.05% in stage N2 sleep, 16.44% in stage N3 and 22.2% in REM.  Alpha intrusion was absent.  Supine sleep was 58.42%.  RESPIRATORY PARAMETERS The overall apnea/hypopnea index (AHI) was 0.5 per hour. There were 1 total apneas, including 1 obstructive, 0 central and 0 mixed apneas. There were 1 hypopneas and  0 RERAs.  The AHI during Stage REM sleep was 1.1 per hour.  AHI while supine was 0.8 per hour.  The mean oxygen saturation was 97.33%. The minimum SpO2 during sleep was 93.00%.  moderate snoring was noted during this study.  CARDIAC DATA The 2 lead EKG demonstrated sinus rhythm. The mean heart rate was 65.11 beats per minute. Other EKG findings include: None.  LEG MOVEMENT DATA The total PLMS were 0 with a resulting PLMS index of 0.00. Associated arousal with leg movement index was 0.0.  IMPRESSIONS This  is a normal nocturnal polysomnography.   Delano Metz, MD Diplomate, American Board of Sleep Medicine. ELECTRONICALLY SIGNED ON:  07/21/2021, 11:00 AM [place] PH: [phone]   FX: [phone] [place]

## 2021-08-13 ENCOUNTER — Other Ambulatory Visit: Payer: Self-pay | Admitting: Family Medicine

## 2021-08-15 ENCOUNTER — Other Ambulatory Visit (HOSPITAL_COMMUNITY)
Admission: RE | Admit: 2021-08-15 | Discharge: 2021-08-15 | Disposition: A | Discharge status: Home / Self Care | Payer: Medicare PPO | Source: Ambulatory Visit | Attending: Nephrology | Admitting: Nephrology

## 2021-08-15 DIAGNOSIS — E1129 Type 2 diabetes mellitus with other diabetic kidney complication: Secondary | ICD-10-CM | POA: Diagnosis present

## 2021-08-15 DIAGNOSIS — D638 Anemia in other chronic diseases classified elsewhere: Secondary | ICD-10-CM | POA: Diagnosis not present

## 2021-08-15 DIAGNOSIS — R600 Localized edema: Secondary | ICD-10-CM | POA: Insufficient documentation

## 2021-08-15 DIAGNOSIS — I129 Hypertensive chronic kidney disease with stage 1 through stage 4 chronic kidney disease, or unspecified chronic kidney disease: Secondary | ICD-10-CM | POA: Diagnosis not present

## 2021-08-15 DIAGNOSIS — N189 Chronic kidney disease, unspecified: Secondary | ICD-10-CM | POA: Diagnosis not present

## 2021-08-15 DIAGNOSIS — E1122 Type 2 diabetes mellitus with diabetic chronic kidney disease: Secondary | ICD-10-CM | POA: Diagnosis not present

## 2021-08-15 LAB — RENAL FUNCTION PANEL
Albumin: 3.4 g/dL — ABNORMAL LOW (ref 3.5–5.0)
Anion gap: 7 (ref 5–15)
BUN: 55 mg/dL — ABNORMAL HIGH (ref 8–23)
CO2: 25 mmol/L (ref 22–32)
Calcium: 9.2 mg/dL (ref 8.9–10.3)
Chloride: 104 mmol/L (ref 98–111)
Creatinine, Ser: 1.49 mg/dL — ABNORMAL HIGH (ref 0.44–1.00)
GFR, Estimated: 35 mL/min — ABNORMAL LOW (ref >60)
Glucose, Bld: 107 mg/dL — ABNORMAL HIGH (ref 70–99)
Phosphorus: 3.2 mg/dL (ref 2.5–4.6)
Potassium: 5 mmol/L (ref 3.5–5.1)
Sodium: 136 mmol/L (ref 135–145)

## 2021-08-15 LAB — CBC WITH DIFFERENTIAL/PLATELET
Abs Immature Granulocytes: 0.05 10*3/uL (ref 0.00–0.07)
Basophils Absolute: 0.1 10*3/uL (ref 0.0–0.1)
Basophils Relative: 1 %
Eosinophils Absolute: 0.2 10*3/uL (ref 0.0–0.5)
Eosinophils Relative: 2 %
HCT: 38.1 % (ref 36.0–46.0)
Hemoglobin: 11.1 g/dL — ABNORMAL LOW (ref 12.0–15.0)
Immature Granulocytes: 1 %
Lymphocytes Relative: 23 %
Lymphs Abs: 2 10*3/uL (ref 0.7–4.0)
MCH: 22.3 pg — ABNORMAL LOW (ref 26.0–34.0)
MCHC: 29.1 g/dL — ABNORMAL LOW (ref 30.0–36.0)
MCV: 76.5 fL — ABNORMAL LOW (ref 80.0–100.0)
Monocytes Absolute: 0.7 10*3/uL (ref 0.1–1.0)
Monocytes Relative: 9 %
Neutro Abs: 5.7 10*3/uL (ref 1.7–7.7)
Neutrophils Relative %: 64 %
Platelets: 348 10*3/uL (ref 150–400)
RBC: 4.98 MIL/uL (ref 3.87–5.11)
RDW: 15.5 % (ref 11.5–15.5)
WBC: 8.7 10*3/uL (ref 4.0–10.5)
nRBC: 0 % (ref 0.0–0.2)

## 2021-08-18 ENCOUNTER — Other Ambulatory Visit: Payer: Self-pay | Admitting: Family Medicine

## 2021-11-13 ENCOUNTER — Other Ambulatory Visit (HOSPITAL_COMMUNITY)
Admission: RE | Admit: 2021-11-13 | Discharge: 2021-11-13 | Disposition: A | Discharge status: Home / Self Care | Payer: Medicare PPO | Source: Ambulatory Visit | Attending: Nephrology | Admitting: Nephrology

## 2021-11-13 DIAGNOSIS — E1129 Type 2 diabetes mellitus with other diabetic kidney complication: Secondary | ICD-10-CM | POA: Diagnosis present

## 2021-11-13 DIAGNOSIS — R809 Proteinuria, unspecified: Secondary | ICD-10-CM | POA: Insufficient documentation

## 2021-11-13 DIAGNOSIS — I129 Hypertensive chronic kidney disease with stage 1 through stage 4 chronic kidney disease, or unspecified chronic kidney disease: Secondary | ICD-10-CM | POA: Diagnosis present

## 2021-11-13 DIAGNOSIS — D638 Anemia in other chronic diseases classified elsewhere: Secondary | ICD-10-CM | POA: Insufficient documentation

## 2021-11-13 DIAGNOSIS — E1122 Type 2 diabetes mellitus with diabetic chronic kidney disease: Secondary | ICD-10-CM | POA: Diagnosis present

## 2021-11-13 DIAGNOSIS — N189 Chronic kidney disease, unspecified: Secondary | ICD-10-CM | POA: Diagnosis present

## 2021-11-13 LAB — CBC
HCT: 34.6 % — ABNORMAL LOW (ref 36.0–46.0)
Hemoglobin: 10.4 g/dL — ABNORMAL LOW (ref 12.0–15.0)
MCH: 22.8 pg — ABNORMAL LOW (ref 26.0–34.0)
MCHC: 30.1 g/dL (ref 30.0–36.0)
MCV: 75.9 fL — ABNORMAL LOW (ref 80.0–100.0)
Platelets: 328 10*3/uL (ref 150–400)
RBC: 4.56 MIL/uL (ref 3.87–5.11)
RDW: 15.7 % — ABNORMAL HIGH (ref 11.5–15.5)
WBC: 8.1 10*3/uL (ref 4.0–10.5)
nRBC: 0 % (ref 0.0–0.2)

## 2021-11-13 LAB — RENAL FUNCTION PANEL
Albumin: 3.4 g/dL — ABNORMAL LOW (ref 3.5–5.0)
Anion gap: 9 (ref 5–15)
BUN: 48 mg/dL — ABNORMAL HIGH (ref 8–23)
CO2: 26 mmol/L (ref 22–32)
Calcium: 9.4 mg/dL (ref 8.9–10.3)
Chloride: 100 mmol/L (ref 98–111)
Creatinine, Ser: 1.33 mg/dL — ABNORMAL HIGH (ref 0.44–1.00)
GFR, Estimated: 40 mL/min — ABNORMAL LOW (ref >60)
Glucose, Bld: 271 mg/dL — ABNORMAL HIGH (ref 70–99)
Phosphorus: 3.1 mg/dL (ref 2.5–4.6)
Potassium: 4.4 mmol/L (ref 3.5–5.1)
Sodium: 135 mmol/L (ref 135–145)

## 2021-12-21 ENCOUNTER — Ambulatory Visit (INDEPENDENT_AMBULATORY_CARE_PROVIDER_SITE_OTHER): Payer: Medicare PPO | Admitting: Urology

## 2021-12-21 ENCOUNTER — Other Ambulatory Visit: Payer: Self-pay

## 2021-12-21 ENCOUNTER — Encounter: Payer: Self-pay | Admitting: Urology

## 2021-12-21 VITALS — BP 183/84 | HR 74

## 2021-12-21 DIAGNOSIS — R8290 Unspecified abnormal findings in urine: Secondary | ICD-10-CM

## 2021-12-21 DIAGNOSIS — N3941 Urge incontinence: Secondary | ICD-10-CM

## 2021-12-21 DIAGNOSIS — N390 Urinary tract infection, site not specified: Secondary | ICD-10-CM | POA: Insufficient documentation

## 2021-12-21 DIAGNOSIS — R32 Unspecified urinary incontinence: Secondary | ICD-10-CM | POA: Insufficient documentation

## 2021-12-21 LAB — URINALYSIS, ROUTINE W REFLEX MICROSCOPIC
Bilirubin, UA: NEGATIVE
Glucose, UA: NEGATIVE
Ketones, UA: NEGATIVE
Leukocytes,UA: NEGATIVE
Nitrite, UA: POSITIVE — AB
Specific Gravity, UA: 1.015 (ref 1.005–1.030)
Urobilinogen, Ur: 0.2 mg/dL (ref 0.2–1.0)
pH, UA: 5.5 (ref 5.0–7.5)

## 2021-12-21 LAB — MICROSCOPIC EXAMINATION: Renal Epithel, UA: NONE SEEN /hpf

## 2021-12-21 MED ORDER — GEMTESA 75 MG PO TABS: 75.0000 mg | ORAL_TABLET | Freq: Every day | ORAL | 0 refills | Status: DC

## 2021-12-21 NOTE — Progress Notes (Signed)
Assessment: 1. Urge incontinence of urine   2. Abnormal urine findings     Plan: Urine culture sent today.  We will contact her with results and recommendations for treatment. Gnosis and management of overactive bladder with urge incontinence discussed with the patient and her son.  Options for management including avoidance of dietary irritants, behavioral modification, medical therapy, neuromodulation, and chemodenervation discussed. Recommend trial of Gemtesa 75 mg daily.  Samples given.  Use and side effects discussed. Return to office in 1 month.  Chief Complaint: Incontinence  History of Present Illness:  Wendy [name] is a 81 y.o. year old female with Alzheimer's dementia and CKD who is seen in consultation from Ulice Bold, MD for evaluation of urinary incontinence. The pt presents with her son who helps with her care and offers some of the pt's history. Pt reports a 1 year h/o urgency, nocturia, and urge incontinence with multiple UTIs dx by her PCP, Dr. Reece Levy, at [facility] Pt wears 4-5 diapers/day and states she is always soaked at night without realizing she has voided. Currently, she denies pain, burning, gross hematuria. She admits to constipation. She has been on "urine medicine" in the past, but states it made her mouth dry so she stopped it. No incontinence meds at present. Pt is post hysto and took HRT in the past, but not for many years. Medical records from [facility] show 2 positive urine cxs in the past. 12/21 and 1/22, both E. Coli resistant to quinolones.   PVR=165ml UA- Nitrite positive, +WBCs, +bacteria  Past Medical History:  Past Medical History:  Diagnosis Date   Acquired hammer toes of both feet    Anemia    Anxiety    Arthritis    Chronic kidney disease    Diabetes mellitus without complication (HCC)    Eczema    Environmental allergies    Fibrocystic breast changes    GERD (gastroesophageal reflux disease)    Hearing loss     Hypercholesteremia    Hypertension    IBS (irritable bowel syndrome)    Incontinence    Memory loss    Osteoarthritis    PVD (peripheral vascular disease) (HCC)    Rheumatoid arthritis ([place])     Past Surgical History:  Past Surgical History:  Procedure Laterality Date   ABDOMINAL HYSTERECTOMY     SHOULDER SURGERY     FATTY TUMOR REMOVED    Allergies:  Allergies  Allergen Reactions   Codeine    Erythromycin    Leflunomide Other (See Comments)   Lisinopril    Naproxen    Penicillins    Pioglitazone    Valsartan Other (See Comments)    Family History:  Family History  Problem Relation Age of Onset   Diabetes Mother    Hypertension Mother    Alzheimer's disease Mother    Heart disease Father    Hypertension Sister    Diabetes Sister    Heart disease Sister    Hypertension Brother    Diabetes Brother    Cancer Sister        lung   Heart disease Sister     Social History:  Social History   Tobacco Use   Smoking status: Never   Smokeless tobacco: Never  Vaping Use   Vaping Use: Never used  Substance Use Topics   Alcohol use: No   Drug use: No    Review of symptoms:  Constitutional:  Negative for night sweats, fever,  chills ENT:  Negative for nose bleeds,painful swallowing CV:  Negative for chest pain, shortness of breath, palpitations, loss of consciousness Resp:  Negative for cough, wheezing, shortness of breath GI:  Negative for nausea, vomiting, diarrhea, bloody stools GU:  Positives noted in HPI; otherwise negative for gross hematuria, dysuria Neuro:  Negative for seizures, poor balance, limb weakness, slurred speech Psych:  +confusion, depression and anxiety Endocrine:  Negative for polydipsia, polyuria, symptoms of hypoglycemia (dizziness, hunger, sweating) Hematologic:  Negative for anemia, purpura, petechia, prolonged or excessive bleeding, use of anticoagulants  Allergic:  Negative for difficulty breathing or choking as a result of exposure  to anything; no shellfish allergy; no allergic response (rash/itch) to materials, foods  Physical exam: BP (!) 183/84    Pulse 74  GENERAL APPEARANCE:  Well developed, thin, NAD HEENT: Atraumatic, Normocephalic NECK: Supple.Trachea midline LUNGS: Clear to auscultation bilaterally. HEART: Regular Rate and Rhythm without murmurs, gallops, or rubs. ABDOMEN: Soft, non-tender, No Masses. EXTREMITIES: Moves all extremities well.  Without clubbing, cyanosis. 1+ pitting edema BLEs NEUROLOGIC:  Alert and cooperative, slow gait, no obvious asymmetry.  MENTAL STATUS:  Appropriate and cooperative. BACK:  Non-tender to palpation.  No CVAT SKIN:  Warm, dry and intact.  Poor turgor  Results: Results for orders placed or performed in visit on 12/21/21 (from the past 24 hour(s))  Microscopic Examination   Collection Time: 12/21/21 11:27 AM   Urine  Result Value Ref Range   WBC, UA 0-5 0 - 5 /hpf   RBC 0-2 0 - 2 /hpf   Epithelial Cells (non renal) 0-10 0 - 10 /hpf   Renal Epithel, UA None seen None seen /hpf   Bacteria, UA Many (A) None seen/Few  Urinalysis, Routine w reflex microscopic   Collection Time: 12/21/21 11:27 AM  Result Value Ref Range   Specific Gravity, UA 1.015 1.005 - 1.030   pH, UA 5.5 5.0 - 7.5   Color, UA Amber (A) Yellow   Appearance Ur Cloudy (A) Clear   Leukocytes,UA Negative Negative   Protein,UA 2+ (A) Negative/Trace   Glucose, UA Negative Negative   Ketones, UA Negative Negative   RBC, UA Trace (A) Negative   Bilirubin, UA Negative Negative   Urobilinogen, Ur 0.2 0.2 - 1.0 mg/dL   Nitrite, UA Positive (A) Negative   Microscopic Examination See below:

## 2021-12-21 NOTE — Progress Notes (Signed)
post void residual= 105  Urological Symptom Review  Patient is experiencing the following symptoms: Frequent urination Hard to postpone urination Get up at night to urinate Leakage of urine Urinary tract infection   Review of Systems  Gastrointestinal (upper)  : Negative for upper GI symptoms  Gastrointestinal (lower) : Constipation  Constitutional : Weight loss  Skin: Skin rash/lesion  Eyes: Blurred vision  Ear/Nose/Throat : Sinus problems  Hematologic/Lymphatic: Negative for Hematologic/Lymphatic symptoms  Cardiovascular : Leg swelling  Respiratory : Negative for respiratory symptoms  Endocrine: Negative for endocrine symptoms  Musculoskeletal: Negative for musculoskeletal symptoms  Neurological: Negative for neurological symptoms  Psychologic: Negative for psychiatric symptoms

## 2021-12-24 LAB — URINE CULTURE

## 2021-12-25 ENCOUNTER — Other Ambulatory Visit: Payer: Self-pay | Admitting: Urology

## 2021-12-25 ENCOUNTER — Telehealth: Payer: Self-pay

## 2021-12-25 MED ORDER — SULFAMETHOXAZOLE-TRIMETHOPRIM 800-160 MG PO TABS: 1.0000 | ORAL_TABLET | Freq: Two times a day (BID) | ORAL | 0 refills | Status: DC

## 2021-12-25 NOTE — Progress Notes (Signed)
E. Coli pansensitive. Tx with Bactrim DS

## 2021-12-25 NOTE — Telephone Encounter (Signed)
Called. Left message to return call to office.

## 2021-12-25 NOTE — Telephone Encounter (Signed)
-----   Message from Reynaldo Minium, [place] sent at 12/25/2021  8:34 AM EST ----- Please let pt know her culture is positive and I have sent in a Rx for tx. Thanks! ----- Message ----- From: Lavone Neri Lab Results In Sent: 12/24/2021   6:44 AM EST To: Reynaldo Minium, PA-C

## 2022-01-05 ENCOUNTER — Emergency Department (HOSPITAL_COMMUNITY)
Admission: EM | Admit: 2022-01-05 | Discharge: 2022-01-06 | Disposition: A | Discharge status: Home / Self Care | Payer: Medicare PPO | Attending: Emergency Medicine | Admitting: Emergency Medicine

## 2022-01-05 ENCOUNTER — Other Ambulatory Visit: Payer: Self-pay

## 2022-01-05 DIAGNOSIS — Z7982 Long term (current) use of aspirin: Secondary | ICD-10-CM | POA: Diagnosis not present

## 2022-01-05 DIAGNOSIS — E7841 Elevated Lipoprotein(a): Secondary | ICD-10-CM | POA: Diagnosis not present

## 2022-01-05 DIAGNOSIS — Z79899 Other long term (current) drug therapy: Secondary | ICD-10-CM | POA: Diagnosis not present

## 2022-01-05 DIAGNOSIS — R112 Nausea with vomiting, unspecified: Secondary | ICD-10-CM | POA: Diagnosis not present

## 2022-01-05 LAB — CBC WITH DIFFERENTIAL/PLATELET
Abs Immature Granulocytes: 0.03 10*3/uL (ref 0.00–0.07)
Basophils Absolute: 0 10*3/uL (ref 0.0–0.1)
Basophils Relative: 0 %
Eosinophils Absolute: 0 10*3/uL (ref 0.0–0.5)
Eosinophils Relative: 0 %
HCT: 36.3 % (ref 36.0–46.0)
Hemoglobin: 11.1 g/dL — ABNORMAL LOW (ref 12.0–15.0)
Immature Granulocytes: 0 %
Lymphocytes Relative: 16 %
Lymphs Abs: 1.4 10*3/uL (ref 0.7–4.0)
MCH: 23 pg — ABNORMAL LOW (ref 26.0–34.0)
MCHC: 30.6 g/dL (ref 30.0–36.0)
MCV: 75.3 fL — ABNORMAL LOW (ref 80.0–100.0)
Monocytes Absolute: 0.7 10*3/uL (ref 0.1–1.0)
Monocytes Relative: 8 %
Neutro Abs: 6.6 10*3/uL (ref 1.7–7.7)
Neutrophils Relative %: 76 %
Platelets: 428 10*3/uL — ABNORMAL HIGH (ref 150–400)
RBC: 4.82 MIL/uL (ref 3.87–5.11)
RDW: 15.4 % (ref 11.5–15.5)
WBC: 8.8 10*3/uL (ref 4.0–10.5)
nRBC: 0 % (ref 0.0–0.2)

## 2022-01-05 LAB — COMPREHENSIVE METABOLIC PANEL
ALT: 14 U/L (ref 0–44)
AST: 22 U/L (ref 15–41)
Albumin: 3.4 g/dL — ABNORMAL LOW (ref 3.5–5.0)
Alkaline Phosphatase: 51 U/L (ref 38–126)
Anion gap: 11 (ref 5–15)
BUN: 41 mg/dL — ABNORMAL HIGH (ref 8–23)
CO2: 23 mmol/L (ref 22–32)
Calcium: 10.2 mg/dL (ref 8.9–10.3)
Chloride: 104 mmol/L (ref 98–111)
Creatinine, Ser: 1.7 mg/dL — ABNORMAL HIGH (ref 0.44–1.00)
GFR, Estimated: 30 mL/min — ABNORMAL LOW (ref >60)
Glucose, Bld: 160 mg/dL — ABNORMAL HIGH (ref 70–99)
Potassium: 5.2 mmol/L — ABNORMAL HIGH (ref 3.5–5.1)
Sodium: 138 mmol/L (ref 135–145)
Total Bilirubin: 0.8 mg/dL (ref 0.3–1.2)
Total Protein: 7.5 g/dL (ref 6.5–8.1)

## 2022-01-05 NOTE — ED Triage Notes (Signed)
Pt seen for nausea and vomiting at [facility] today and treated. UC was unable to get an EKG and pt was referred to this ED.

## 2022-01-05 NOTE — ED Notes (Signed)
Pt at [facility] yesterday due to n/v . Seen at urgent care in [facility] today and was sent here for ekg due to HTN. Pt a/o. Mm moist. C/o n/v. [facility], pt went home and ate and has held that down. Denies pain. Family at bedside and pt firm about getting the ekg.

## 2022-01-05 NOTE — ED Provider Notes (Signed)
[facility] EMERGENCY DEPARTMENT Provider Note   CSN: [id number] Arrival date & time: 01/05/22  2011     History  Chief Complaint  Patient presents with   Nausea   Emesis    Wendy [name] is a 81 y.o. female.  Patient presents to the emergency department requesting an EKG.  Patient has been sick for 2 days with nausea and vomiting.  She has not had any associated abdominal pain, however has not been able to hold anything down.  She went to [facility] 2 days ago for same.  She was seen at urgent care today, given IV fluids and Zofran.  Her blood pressure was elevated at urgent care, she was told to go to the ER for an EKG.  She has not been able to hold down her normal blood pressure meds.  She did feel better after getting medication at urgent care, was able to eat scrambled egg at home.  She is now here to get "the rest of the work-up"      Home Medications Prior to Admission medications   Medication Sig Start Date End Date Taking? Authorizing Provider  ondansetron (ZOFRAN) 4 MG tablet Take 1 tablet (4 mg total) by mouth every 6 (six) hours as needed for nausea or vomiting. 01/06/22  Yes Rochele Lueck, Gwenyth Allegra, MD  pantoprazole (PROTONIX) 40 MG tablet Take 1 tablet (40 mg total) by mouth daily. 01/06/22  Yes Denali Sharma, Gwenyth Allegra, MD  sulfamethoxazole-trimethoprim (BACTRIM DS) 800-160 MG tablet Take 1 tablet by mouth 2 (two) times daily. 12/25/21   Summerlin, Berneice Heinrich, PA-C  aspirin EC 81 MG tablet Take 81 mg by mouth daily.    [provider]  cilostazol (PLETAL) 100 MG tablet Take 1 tablet (100 mg total) by mouth daily. 04/13/20   Janora Norlander, DO  cloNIDine (CATAPRES - DOSED IN MG/24 HR) 0.2 mg/24hr patch Place 1 patch (0.2 mg total) onto the skin once a week. 09/07/20   Janora Norlander, DO  cycloSPORINE (RESTASIS) 0.05 % ophthalmic emulsion Place 1 drop into both eyes 2 (two) times daily. 04/13/20   Janora Norlander, DO  diclofenac Sodium  (VOLTAREN) 1 % GEL Apply 2 g topically daily.    [provider]  epoetin alfa-epbx (RETACRIT) 3000 UNIT/ML injection 3,000 Units every 14 (fourteen) days.    Bhutani, Manpreet S, MD  folic acid (FOLVITE) 1 MG tablet Take 1 tablet (1 mg total) by mouth daily. 05/06/20   Janora Norlander, DO  glimepiride (AMARYL) 4 MG tablet Take 4 mg by mouth every morning. 12/01/21   [provider]  hydrochlorothiazide (HYDRODIURIL) 12.5 MG tablet Take 1 tablet (12.5 mg total) by mouth daily. 08/18/20   Janora Norlander, DO  meclizine (ANTIVERT) 25 MG tablet TAKE 1 TABLET BY MOUTH THREE TIMES DAILY AS NEEDED FOR DIZZINESS 09/27/20   Ronnie Doss M, DO  memantine (NAMENDA) 10 MG tablet Take 1 tablet (10 mg total) by mouth in the morning and at bedtime. 04/13/20   Janora Norlander, DO  metFORMIN (GLUCOPHAGE) 1000 MG tablet Take 0.5 tablets (500 mg total) by mouth 2 (two) times daily with a meal. Renal dose 03/07/20   Ronnie Doss M, DO  Omega-3 Fatty Acids (FISH OIL) 1000 MG CAPS Take 1,000 mg by mouth daily.    [provider]  rivastigmine (EXELON) 9.5 mg/24hr APPLY 1 PATCH TOPICALLY ONCE DAILY 12/29/20   Ronnie Doss M, DO  senna (SENOKOT) 8.6 MG tablet Take 1 tablet  by mouth daily.    [provider]  Vibegron (GEMTESA) 75 MG TABS Take 75 mg by mouth daily. 12/21/21   Summerlin, Berneice Heinrich, PA-C      Allergies    Codeine, Erythromycin, Leflunomide, Lisinopril, Naproxen, Penicillins, Pioglitazone, and Valsartan    Review of Systems   Review of Systems  Gastrointestinal:  Positive for nausea and vomiting.   Physical Exam Updated Vital Signs BP (!) 148/88    Pulse 81    Temp 97.8 F (36.6 C) (Oral)    Resp 18    Ht 5\' 9"  (1.753 m)    Wt 53.5 kg    SpO2 100%    BMI 17.43 kg/m  Physical Exam Vitals and nursing note reviewed.  Constitutional:      General: She is not in acute distress.    Appearance: She is well-developed.  HENT:     Head:  Normocephalic and atraumatic.     Mouth/Throat:     Mouth: Mucous membranes are moist.  Eyes:     General: Vision grossly intact. Gaze aligned appropriately.     Extraocular Movements: Extraocular movements intact.     Conjunctiva/sclera: Conjunctivae normal.  Cardiovascular:     Rate and Rhythm: Normal rate and regular rhythm.     Pulses: Normal pulses.     Heart sounds: Normal heart sounds, S1 normal and S2 normal. No murmur heard.   No friction rub. No gallop.  Pulmonary:     Effort: Pulmonary effort is normal. No respiratory distress.     Breath sounds: Normal breath sounds.  Abdominal:     General: Bowel sounds are normal.     Palpations: Abdomen is soft.     Tenderness: There is no abdominal tenderness. There is no guarding or rebound.     Hernia: No hernia is present.  Musculoskeletal:        General: No swelling.     Cervical back: Full passive range of motion without pain, normal range of motion and neck supple. No spinous process tenderness or muscular tenderness. Normal range of motion.     Right lower leg: No edema.     Left lower leg: No edema.  Skin:    General: Skin is warm and dry.     Capillary Refill: Capillary refill takes less than 2 seconds.     Findings: No ecchymosis, erythema, rash or wound.  Neurological:     General: No focal deficit present.     Mental Status: She is alert and oriented to person, place, and time.     GCS: GCS eye subscore is 4. GCS verbal subscore is 5. GCS motor subscore is 6.     Cranial Nerves: Cranial nerves 2-12 are intact.     Sensory: Sensation is intact.     Motor: Motor function is intact.     Coordination: Coordination is intact.  Psychiatric:        Attention and Perception: Attention normal.        Mood and Affect: Mood normal.        Speech: Speech normal.        Behavior: Behavior normal.    ED Results / Procedures / Treatments   Labs (all labs ordered are listed, but only abnormal results are displayed) Labs  Reviewed  CBC WITH DIFFERENTIAL/PLATELET - Abnormal; Notable for the following components:      Result Value   Hemoglobin 11.1 (*)    MCV 75.3 (*)    MCH 23.0 (*)  Platelets 428 (*)    All other components within normal limits  COMPREHENSIVE METABOLIC PANEL - Abnormal; Notable for the following components:   Potassium 5.2 (*)    Glucose, Bld 160 (*)    BUN 41 (*)    Creatinine, Ser 1.70 (*)    Albumin 3.4 (*)    GFR, Estimated 30 (*)    All other components within normal limits  URINALYSIS, ROUTINE W REFLEX MICROSCOPIC - Abnormal; Notable for the following components:   Glucose, UA 50 (*)    Ketones, ur 5 (*)    Protein, ur 100 (*)    All other components within normal limits  LIPASE, BLOOD - Abnormal; Notable for the following components:   Lipase 116 (*)    All other components within normal limits  URINE CULTURE  TROPONIN I (HIGH SENSITIVITY)    EKG EKG Interpretation  Date/Time:  Friday January 05 2022 22:18:12 EST Ventricular Rate:  74 PR Interval:  160 QRS Duration: 131 QT Interval:  447 QTC Calculation: 496 R Axis:   -68 Text Interpretation: Sinus rhythm RBBB and LAFB Left ventricular hypertrophy Anterior Q waves, possibly due to LVH Baseline wander in lead(s) III aVF V6 Since last tracing rbbb now present Confirmed by Noemi Chapel [phone]) on 01/05/2022 10:35:26 PM  Radiology No results found.  Procedures Procedures    Medications Ordered in ED Medications - No data to display  ED Course/ Medical Decision Making/ A&P                           Medical Decision Making Amount and/or Complexity of Data Reviewed Labs: ordered.   Patient presents to emergency department for evaluation of nausea and vomiting.  Patient has had symptoms for 2 days.  Patient went to urgent care earlier today, received IV fluids and Zofran and now the vomiting has stopped.  She was told to come to the ER because her blood pressure was elevated at urgent care.  This has self  corrected.  Patient is symptom-free at arrival.  Additional work-up has been performed.  Cardiac exam is unremarkable.  The only positive finding was slightly elevated lipase.  I discussed this with the patient and her family.  I recommended CT scan to further evaluate.  She and her family declined.  They would prefer to go home with symptom treatment, return if symptoms worsen.  They understand that I am looking for potentially life-threatening problem and pancreatitis, declining procedure.  They were informed of the risks.  Patient will be discharged with Zofran.  Follow-up with gastroenterology.        Final Clinical Impression(s) / ED Diagnoses Final diagnoses:  Nausea and vomiting, unspecified vomiting type    Rx / DC Orders ED Discharge Orders          Ordered    Ambulatory referral to Gastroenterology        01/06/22 0234    pantoprazole (PROTONIX) 40 MG tablet  Daily        01/06/22 0235    ondansetron (ZOFRAN) 4 MG tablet  Every 6 hours PRN        01/06/22 0235              Orpah Greek, MD 01/06/22 [phone]

## 2022-01-06 LAB — URINALYSIS, ROUTINE W REFLEX MICROSCOPIC
Bacteria, UA: NONE SEEN
Bilirubin Urine: NEGATIVE
Glucose, UA: 50 mg/dL — AB
Hgb urine dipstick: NEGATIVE
Ketones, ur: 5 mg/dL — AB
Leukocytes,Ua: NEGATIVE
Nitrite: NEGATIVE
Protein, ur: 100 mg/dL — AB
Specific Gravity, Urine: 1.012 (ref 1.005–1.030)
pH: 7 (ref 5.0–8.0)

## 2022-01-06 LAB — TROPONIN I (HIGH SENSITIVITY): Troponin I (High Sensitivity): 17 ng/L (ref <18)

## 2022-01-06 LAB — LIPASE, BLOOD: Lipase: 116 U/L — ABNORMAL HIGH (ref 11–51)

## 2022-01-06 MED ORDER — ONDANSETRON 8 MG PO TBDP
8.0000 mg | ORAL_TABLET | Freq: Once | ORAL | Status: AC
  Administered 2022-01-06: 8 mg via ORAL
  Filled 2022-01-06: qty 1

## 2022-01-06 MED ORDER — ONDANSETRON HCL 4 MG PO TABS: 4.0000 mg | ORAL_TABLET | Freq: Four times a day (QID) | ORAL | 0 refills | Status: DC | PRN

## 2022-01-06 MED ORDER — PANTOPRAZOLE SODIUM 40 MG PO TBEC: 40.0000 mg | DELAYED_RELEASE_TABLET | Freq: Every day | ORAL | 3 refills | Status: DC

## 2022-01-08 ENCOUNTER — Encounter (INDEPENDENT_AMBULATORY_CARE_PROVIDER_SITE_OTHER): Payer: Self-pay | Admitting: *Deleted

## 2022-01-09 LAB — URINE CULTURE: Culture: 10000 — AB

## 2022-01-09 NOTE — Telephone Encounter (Signed)
Called patient to confirm if medication was received.  Left message to return call.

## 2022-01-10 ENCOUNTER — Telehealth: Payer: Self-pay | Admitting: *Deleted

## 2022-01-10 NOTE — Telephone Encounter (Signed)
Post ED Visit - Positive Culture Follow-up  Culture report reviewed by antimicrobial stewardship pharmacist: [place] Team []  Elenor Quinones, Pharm.D. []  Heide Guile, Pharm.D., BCPS AQ-ID []  Parks Neptune, Pharm.D., BCPS []  Alycia Rossetti, Pharm.D., BCPS []  [place], Pharm.D., BCPS, AAHIVP []  Legrand Como, Pharm.D., BCPS, AAHIVP []  Salome Arnt, PharmD, BCPS []  Johnnette Gourd, PharmD, BCPS []  Hughes Better, PharmD, BCPS []  Leeroy Cha, PharmD []  Laqueta Linden, PharmD, BCPS []  Albertina Parr, PharmD  [place] Team []  Leodis Sias, PharmD []  Lindell Spar, PharmD []  Royetta Asal, PharmD []  Graylin Shiver, Rph []  Rema Fendt) Glennon Mac, PharmD []  Arlyn Dunning, PharmD []  Netta Cedars, PharmD []  Dia Sitter, PharmD []  Leone Haven, PharmD []  Gretta Arab, PharmD []  Theodis Shove, PharmD []  Peggyann Juba, PharmD []  Reuel Boom, PharmD   Positive urine culture Reviewed by Bertis Ruddy, Pharm D, do not treat, and no further patient follow-up is required at this time.  Harlon Flor [facility] 01/10/2022, 10:40 AM

## 2022-01-17 ENCOUNTER — Ambulatory Visit: Payer: Medicare PPO | Admitting: Urology

## 2022-01-18 ENCOUNTER — Ambulatory Visit: Payer: Medicare PPO | Admitting: Urology

## 2022-01-18 NOTE — Progress Notes (Unsigned)
Assessment: 1. Urge incontinence of urine   2. History of UTI      Plan: Urine culture sent today.  We will contact her with results and recommendations for treatment. Gnosis and management of overactive bladder with urge incontinence discussed with the patient and her son.  Options for management including avoidance of dietary irritants, behavioral modification, medical therapy, neuromodulation, and chemodenervation discussed. Recommend trial of Gemtesa 75 mg daily.  Samples given.  Use and side effects discussed. Return to office in 1 month.  Chief Complaint: Incontinence  History of Present Illness:  Wendy [name] is a 81 y.o. year old female with Alzheimer's dementia and CKD who is seen for further evaluation of urinary incontinence. At her initial visit in 1/23, she reported a 1 year h/o urgency, nocturia, and urge incontinence with multiple UTIs dx by her PCP, Dr. Reece Levy, at [facility] on [address] She was using 4-5 diapers/day and reported incontinence at night without realizing it. No pain, burning, gross hematuria. History of constipation. She had been on "urine medicine" in the past, but states it made her mouth dry so she stopped it.  Pt is post hysterectomy and took HRT in the past, but not for many years. Medical records from [facility] show 2 positive urine cxs in the past. 12/21 and 1/22, both E. Coli resistant to quinolones.  PVR=149ml Urine culture from 12/21/2021 grew >100 K E. coli.  She was treated with Bactrim. She was seen in the emergency room on 01/06/2022 with nausea and vomiting.  Urine culture grew 10K colonies of Enterococcus. She was given a trial of Gemtesa 75 mg daily at her visit in January 2023.  Portions of the above documentation were copied from a prior visit for review purposes only.  Past Medical History:  Past Medical History:  Diagnosis Date   Acquired hammer toes of both feet    Anemia    Anxiety    Arthritis    Chronic kidney disease     Diabetes mellitus without complication (HCC)    Eczema    Environmental allergies    Fibrocystic breast changes    GERD (gastroesophageal reflux disease)    Hearing loss    Hypercholesteremia    Hypertension    IBS (irritable bowel syndrome)    Incontinence    Memory loss    Osteoarthritis    PVD (peripheral vascular disease) (HCC)    Rheumatoid arthritis ([place])     Past Surgical History:  Past Surgical History:  Procedure Laterality Date   ABDOMINAL HYSTERECTOMY     SHOULDER SURGERY     FATTY TUMOR REMOVED    Allergies:  Allergies  Allergen Reactions   Codeine    Erythromycin    Leflunomide Other (See Comments)   Lisinopril    Naproxen    Penicillins    Pioglitazone    Valsartan Other (See Comments)    Family History:  Family History  Problem Relation Age of Onset   Diabetes Mother    Hypertension Mother    Alzheimer's disease Mother    Heart disease Father    Hypertension Sister    Diabetes Sister    Heart disease Sister    Hypertension Brother    Diabetes Brother    Cancer Sister        lung   Heart disease Sister     Social History:  Social History   Tobacco Use   Smoking status: Never   Smokeless tobacco: Never  Vaping  Use   Vaping Use: Never used  Substance Use Topics   Alcohol use: No   Drug use: No    ROS: Constitutional:  Negative for fever, chills, weight loss CV: Negative for chest pain, previous MI, hypertension Respiratory:  Negative for shortness of breath, wheezing, sleep apnea, frequent cough GI:  Negative for nausea, vomiting, bloody stool, GERD  Physical exam: There were no vitals taken for this visit. ***  Results: No results found for this or any previous visit (from the past 24 hour(s)).

## 2022-01-24 ENCOUNTER — Other Ambulatory Visit: Payer: Self-pay | Admitting: Family Medicine

## 2022-01-24 DIAGNOSIS — I739 Peripheral vascular disease, unspecified: Secondary | ICD-10-CM

## 2022-02-02 ENCOUNTER — Ambulatory Visit
Admission: RE | Admit: 2022-02-02 | Discharge: 2022-02-02 | Disposition: A | Discharge status: Home / Self Care | Payer: Medicare PPO | Source: Ambulatory Visit | Attending: Family Medicine | Admitting: Family Medicine

## 2022-02-02 DIAGNOSIS — I739 Peripheral vascular disease, unspecified: Secondary | ICD-10-CM

## 2022-03-19 ENCOUNTER — Ambulatory Visit (INDEPENDENT_AMBULATORY_CARE_PROVIDER_SITE_OTHER): Payer: Medicare PPO | Admitting: Gastroenterology

## 2022-03-19 ENCOUNTER — Ambulatory Visit: Payer: Medicare PPO | Admitting: Podiatry

## 2022-03-20 ENCOUNTER — Ambulatory Visit: Payer: Medicare PPO | Admitting: Podiatry

## 2022-03-20 ENCOUNTER — Other Ambulatory Visit: Payer: Medicare PPO

## 2022-03-29 ENCOUNTER — Other Ambulatory Visit (HOSPITAL_COMMUNITY)
Admission: RE | Admit: 2022-03-29 | Discharge: 2022-03-29 | Disposition: A | Discharge status: Home / Self Care | Payer: Medicare PPO | Source: Ambulatory Visit | Attending: Nephrology | Admitting: Nephrology

## 2022-03-29 DIAGNOSIS — D638 Anemia in other chronic diseases classified elsewhere: Secondary | ICD-10-CM | POA: Diagnosis not present

## 2022-03-29 DIAGNOSIS — I129 Hypertensive chronic kidney disease with stage 1 through stage 4 chronic kidney disease, or unspecified chronic kidney disease: Secondary | ICD-10-CM | POA: Diagnosis present

## 2022-03-29 DIAGNOSIS — E1129 Type 2 diabetes mellitus with other diabetic kidney complication: Secondary | ICD-10-CM | POA: Insufficient documentation

## 2022-03-29 DIAGNOSIS — E1122 Type 2 diabetes mellitus with diabetic chronic kidney disease: Secondary | ICD-10-CM | POA: Diagnosis present

## 2022-03-29 DIAGNOSIS — R809 Proteinuria, unspecified: Secondary | ICD-10-CM | POA: Insufficient documentation

## 2022-03-29 DIAGNOSIS — N189 Chronic kidney disease, unspecified: Secondary | ICD-10-CM | POA: Insufficient documentation

## 2022-03-29 LAB — RENAL FUNCTION PANEL
Albumin: 3.3 g/dL — ABNORMAL LOW (ref 3.5–5.0)
Anion gap: 8 (ref 5–15)
BUN: 52 mg/dL — ABNORMAL HIGH (ref 8–23)
CO2: 27 mmol/L (ref 22–32)
Calcium: 9.5 mg/dL (ref 8.9–10.3)
Chloride: 103 mmol/L (ref 98–111)
Creatinine, Ser: 1.44 mg/dL — ABNORMAL HIGH (ref 0.44–1.00)
GFR, Estimated: 36 mL/min — ABNORMAL LOW (ref >60)
Glucose, Bld: 111 mg/dL — ABNORMAL HIGH (ref 70–99)
Phosphorus: 4 mg/dL (ref 2.5–4.6)
Potassium: 4.1 mmol/L (ref 3.5–5.1)
Sodium: 138 mmol/L (ref 135–145)

## 2022-03-29 LAB — IRON AND TIBC
Iron: 35 ug/dL (ref 28–170)
Saturation Ratios: 11 % (ref 10.4–31.8)
TIBC: 304 ug/dL (ref 250–450)
UIBC: 269 ug/dL

## 2022-03-29 LAB — VITAMIN D 25 HYDROXY (VIT D DEFICIENCY, FRACTURES): Vit D, 25-Hydroxy: 33.43 ng/mL (ref 30–100)

## 2022-03-29 LAB — CBC
HCT: 33.4 % — ABNORMAL LOW (ref 36.0–46.0)
Hemoglobin: 9.9 g/dL — ABNORMAL LOW (ref 12.0–15.0)
MCH: 22 pg — ABNORMAL LOW (ref 26.0–34.0)
MCHC: 29.6 g/dL — ABNORMAL LOW (ref 30.0–36.0)
MCV: 74.4 fL — ABNORMAL LOW (ref 80.0–100.0)
Platelets: 444 10*3/uL — ABNORMAL HIGH (ref 150–400)
RBC: 4.49 MIL/uL (ref 3.87–5.11)
RDW: 15.8 % — ABNORMAL HIGH (ref 11.5–15.5)
WBC: 8.9 10*3/uL (ref 4.0–10.5)
nRBC: 0 % (ref 0.0–0.2)

## 2022-03-29 LAB — FERRITIN: Ferritin: 49 ng/mL (ref 11–307)

## 2022-03-30 LAB — PTH, INTACT AND CALCIUM
Calcium, Total (PTH): 9.6 mg/dL (ref 8.7–10.3)
PTH: 71 pg/mL — ABNORMAL HIGH (ref 15–65)

## 2022-04-02 ENCOUNTER — Other Ambulatory Visit: Payer: Medicare PPO

## 2022-04-02 ENCOUNTER — Ambulatory Visit (INDEPENDENT_AMBULATORY_CARE_PROVIDER_SITE_OTHER): Payer: Self-pay | Admitting: Podiatry

## 2022-04-02 DIAGNOSIS — Z91199 Patient's noncompliance with other medical treatment and regimen due to unspecified reason: Secondary | ICD-10-CM

## 2022-04-05 NOTE — Progress Notes (Signed)
No show

## 2022-04-18 ENCOUNTER — Encounter: Payer: Medicare PPO | Admitting: Vascular Surgery

## 2022-04-25 ENCOUNTER — Ambulatory Visit: Payer: Medicare PPO | Admitting: Vascular Surgery

## 2022-04-25 ENCOUNTER — Encounter: Payer: Self-pay | Admitting: Vascular Surgery

## 2022-04-25 VITALS — BP 146/82 | HR 70 | Temp 98.2°F | Resp 16 | Ht 69.0 in | Wt 118.2 lb

## 2022-04-25 DIAGNOSIS — I739 Peripheral vascular disease, unspecified: Secondary | ICD-10-CM | POA: Diagnosis not present

## 2022-04-25 DIAGNOSIS — I87303 Chronic venous hypertension (idiopathic) without complications of bilateral lower extremity: Secondary | ICD-10-CM | POA: Diagnosis not present

## 2022-04-25 NOTE — Progress Notes (Signed)
[facility] [place]  Patient name: Wendy [name] MRN: [id number] DOB: 1941-05-16 Sex: female  REASON FOR CONSULT: Evaluation lower extremity pain and arterial insufficiency  HPI: Wendy [name] is a 81 y.o. female, who is here today for evaluation of multiple complaints in both lower extremities.  She is here today with her son who helps care for her.  She apparently has some progressive dementia but asked very appropriate questions today and has knowledge of her prior work-up.  She has multiple components of her lower extremity complaints.  She reports pain on the plantar surface of her feet and numbness and stinging as well.  She specifically denies any calf claudication symptoms.  She does have swelling in both lower extremities from mid calf into her feet and reports that this improves when she is able to wear compression garments.  She reports that she has not been wearing her compression and needs to find new affordable stockings.  She does have some difficulty with fungal infection in her toenails but no history of tissue loss.  She does have what appears to be a healed venous ulcer on the medial aspect of her right ankle.  Past Medical History:  Diagnosis Date   Acquired hammer toes of both feet    Anemia    Anxiety    Arthritis    Chronic kidney disease    Diabetes mellitus without complication (HCC)    Eczema    Environmental allergies    Fibrocystic breast changes    GERD (gastroesophageal reflux disease)    Hearing loss    Hypercholesteremia    Hypertension    IBS (irritable bowel syndrome)    Incontinence    Memory loss    Osteoarthritis    PVD (peripheral vascular disease) (HCC)    Rheumatoid arthritis ([place])     Family History  Problem Relation Age of Onset   Diabetes Mother    Hypertension Mother    Alzheimer's disease Mother    Heart disease Father    Hypertension Sister    Diabetes Sister    Heart  disease Sister    Hypertension Brother    Diabetes Brother    Cancer Sister        lung   Heart disease Sister     SOCIAL HISTORY: Social History   Socioeconomic History   Marital status: Divorced    Spouse name: Not on file   Number of children: Not on file   Years of education: Not on file   Highest education level: Not on file  Occupational History   Occupation: Retired    Comment: Social research officer, government for [employer]  Tobacco Use   Smoking status: Never   Smokeless tobacco: Never  Vaping Use   Vaping Use: Never used  Substance and Sexual Activity   Alcohol use: No   Drug use: No   Sexual activity: Not on file  Other Topics Concern   Not on file  Social History Narrative   Never Smoked   No Alcohol   No Recreational Drug Use   Retired from [employer] work   Marital Status: Divorced since 2003   Children: 5 kids, 12 grandchildren, 69 great grandchildren   Religion: [place]   Right handed    Lives with family    Social Determinants of Health   Financial Resource Strain: Not on file  Food Insecurity: Not on file  Transportation Needs: Not on file  Physical Activity: Not on  file  Stress: Not on file  Social Connections: Not on file  Intimate Partner Violence: Not on file    Allergies  Allergen Reactions   Codeine    Erythromycin    Leflunomide Other (See Comments)   Lisinopril    Naproxen    Penicillins    Pioglitazone    Valsartan Other (See Comments)    Current Outpatient Medications  Medication Sig Dispense Refill   aspirin EC 81 MG tablet Take 81 mg by mouth daily.     cilostazol (PLETAL) 100 MG tablet Take 1 tablet (100 mg total) by mouth daily. 90 tablet 3   cloNIDine (CATAPRES - DOSED IN MG/24 HR) 0.2 mg/24hr patch Place 1 patch (0.2 mg total) onto the skin once a week. 4 patch 0   cycloSPORINE (RESTASIS) 0.05 % ophthalmic emulsion Place 1 drop into both eyes 2 (two) times daily. 0.4 mL 5   diclofenac Sodium (VOLTAREN) 1 % GEL Apply 2 g  topically daily.     epoetin alfa-epbx (RETACRIT) 3000 UNIT/ML injection 3,000 Units every 14 (fourteen) days.     folic acid (FOLVITE) 1 MG tablet Take 1 tablet (1 mg total) by mouth daily. 90 tablet 3   glimepiride (AMARYL) 4 MG tablet Take 4 mg by mouth every morning.     hydrochlorothiazide (HYDRODIURIL) 12.5 MG tablet Take 1 tablet (12.5 mg total) by mouth daily. 30 tablet 4   meclizine (ANTIVERT) 25 MG tablet TAKE 1 TABLET BY MOUTH THREE TIMES DAILY AS NEEDED FOR DIZZINESS 30 tablet 0   memantine (NAMENDA) 10 MG tablet Take 1 tablet (10 mg total) by mouth in the morning and at bedtime. 90 tablet 3   metFORMIN (GLUCOPHAGE) 1000 MG tablet Take 0.5 tablets (500 mg total) by mouth 2 (two) times daily with a meal. Renal dose 180 tablet 1   Omega-3 Fatty Acids (FISH OIL) 1000 MG CAPS Take 1,000 mg by mouth daily.     ondansetron (ZOFRAN) 4 MG tablet Take 1 tablet (4 mg total) by mouth every 6 (six) hours as needed for nausea or vomiting. 20 tablet 0   pantoprazole (PROTONIX) 40 MG tablet Take 1 tablet (40 mg total) by mouth daily. 30 tablet 3   rivastigmine (EXELON) 9.5 mg/24hr APPLY 1 PATCH TOPICALLY ONCE DAILY 30 patch 2   senna (SENOKOT) 8.6 MG tablet Take 1 tablet by mouth daily.     sulfamethoxazole-trimethoprim (BACTRIM DS) 800-160 MG tablet Take 1 tablet by mouth 2 (two) times daily. 14 tablet 0   Vibegron (GEMTESA) 75 MG TABS Take 75 mg by mouth daily. 28 tablet 0   No current facility-administered medications for this visit.    REVIEW OF SYSTEMS:  '[X]'$  denotes positive finding, '[ ]'$  denotes negative finding Cardiac  Comments:  Chest pain or chest pressure:    Shortness of breath upon exertion:    Short of breath when lying flat:    Irregular heart rhythm:        Vascular    Pain in calf, thigh, or hip brought on by ambulation: x   Pain in feet at night that wakes you up from your sleep:  x   Blood clot in your veins:    Leg swelling:  x       Pulmonary    Oxygen at home:     Productive cough:     Wheezing:         Neurologic    Sudden weakness in arms or legs:  Sudden numbness in arms or legs:     Sudden onset of difficulty speaking or slurred speech:    Temporary loss of vision in one eye:     Problems with dizziness:         Gastrointestinal    Blood in stool:     Vomited blood:         Genitourinary    Burning when urinating:     Blood in urine:        Psychiatric    Major depression:         Hematologic    Bleeding problems:    Problems with blood clotting too easily:        Skin    Rashes or ulcers:        Constitutional    Fever or chills:      PHYSICAL EXAM: Vitals:   04/25/22 0830  BP: (!) 146/82  Pulse: 70  Resp: 16  Temp: 98.2 F (36.8 C)  TempSrc: Temporal  SpO2: 96%  Weight: 118 lb 3.2 oz (53.6 kg)  Height: '5\' 9"'$  (1.753 m)    GENERAL: The patient is a well-nourished female, in no acute distress. The vital signs are documented above. CARDIOVASCULAR: 2+ radial and 2+ femoral pulses bilaterally.  Absent popliteal and distal pulses bilaterally.  By hand-held Doppler she does have audible peroneal and dorsalis pedis pulses bilaterally.  I do not hear posterior tibial pulses bilaterally PULMONARY: There is good air exchange  MUSCULOSKELETAL: There are no major deformities or cyanosis. NEUROLOGIC: No focal weakness or paresthesias are detected. SKIN: There are no ulcers or rashes noted.  Circumferential hemosiderin deposits and skin changes consistent with venous hypertension on both lower extremities PSYCHIATRIC: The patient has a normal affect.  DATA:  Noninvasive studies from [facility] imaging are available for review.  This reveals ankle arm index of 0.63 on the right and 0.56 on the left.  Her waveforms suggest SFA and tibial disease.  MEDICAL ISSUES: Had long discussion with the patient and her son present.  I feel that she has multiple causes for her lower extremity discomfort.  She clearly has venous  hypertension with hemosiderin deposit and thickening of her skin bilaterally.  She does not have any tissue loss suggesting critical limb ischemia.  She has no claudication symptoms.  Her foot pain is more consistent with neuropathy than arterial rest pain.  I did explain options for treatment of arterial insufficiency but would certainly reserve this for critical limb ischemia.  We will see her again in 2 months for continued discussion.  I did discuss the importance of compression to help with her swelling which is a major component of her complaints.  She will notify should she develop any tissue loss but otherwise will be seen in 2 months   Rosetta Posner, MD [facility] [phone] Pager [phone]  Note: Portions of this report may have been transcribed using voice recognition software.  Every effort has been made to ensure accuracy; however, inadvertent computerized transcription errors may still be present.

## 2022-05-01 ENCOUNTER — Other Ambulatory Visit: Payer: Medicare PPO

## 2022-05-01 ENCOUNTER — Ambulatory Visit: Payer: Self-pay | Admitting: Podiatry

## 2022-05-11 ENCOUNTER — Ambulatory Visit: Payer: Medicare PPO | Admitting: Podiatry

## 2022-05-15 ENCOUNTER — Ambulatory Visit: Payer: Medicare PPO | Admitting: Podiatry

## 2022-05-15 ENCOUNTER — Ambulatory Visit: Payer: Medicare PPO

## 2022-05-15 DIAGNOSIS — M79675 Pain in left toe(s): Secondary | ICD-10-CM | POA: Diagnosis not present

## 2022-05-15 DIAGNOSIS — B351 Tinea unguium: Secondary | ICD-10-CM | POA: Diagnosis not present

## 2022-05-15 DIAGNOSIS — E1151 Type 2 diabetes mellitus with diabetic peripheral angiopathy without gangrene: Secondary | ICD-10-CM | POA: Diagnosis not present

## 2022-05-15 DIAGNOSIS — L600 Ingrowing nail: Secondary | ICD-10-CM

## 2022-05-15 DIAGNOSIS — M79674 Pain in right toe(s): Secondary | ICD-10-CM | POA: Diagnosis not present

## 2022-05-15 DIAGNOSIS — N181 Chronic kidney disease, stage 1: Secondary | ICD-10-CM

## 2022-05-15 MED ORDER — DOXYCYCLINE HYCLATE 100 MG PO TABS: 100.0000 mg | ORAL_TABLET | Freq: Two times a day (BID) | ORAL | 0 refills | Status: DC

## 2022-05-15 NOTE — Patient Instructions (Signed)
Wash the left big toe with soap and water daily, dry well.  You can apply a small amount of antibiotic ointment and a bandage.  Start doxycycline.  If you notice any increase in swelling, redness, drainage or any signs of infection please call us immediately or go to the emergency room.

## 2022-05-15 NOTE — Progress Notes (Signed)
SITUATION Reason for Consult: Evaluation for Prefabricated Diabetic Shoes and Custom Diabetic Inserts. Patient / Caregiver Report: Patient would like well fitting shoes  OBJECTIVE DATA: Patient History / Diagnosis:    ICD-10-CM   1. Type 2 diabetes mellitus with stage 1 chronic kidney disease, without long-term current use of insulin (HCC)  E11.22    N18.1       Physician Treating Diabetes:  Patient does not know who her physician is. Wendy [name] has left the practice. She sees [facility] on [address] and needs [place] to reach out to find who to send the paperwork to.  Current or Previous Devices:   Current user  In-Person Foot Examination: Ulcers & Callousing:   None Deformities:    hammertoes Sensation:    Compromised  Shoe Size:     11W  ORTHOTIC RECOMMENDATION Recommended Devices: - 1x pair prefabricated PDAC approved diabetic shoes; Patient Selected [id number] 300 Size 11W - 3x pair custom-to-patient PDAC approved vacuum formed diabetic insoles.  GOALS OF SHOES AND INSOLES - Reduce shear and pressure - Reduce / Prevent callus formation - Reduce / Prevent ulceration - Protect the fragile healing compromised diabetic foot.  Patient would benefit from diabetic shoes and inserts as patient has diabetes mellitus and the patient has one or more of the following conditions: - History of partial or complete amputation of the foot - History of previous foot ulceration. - History of pre-ulcerative callus - Peripheral neuropathy with evidence of callus formation - Foot deformity - Poor circulation  ACTIONS PERFORMED Potential out of pocket cost was communicated to patient. Patient understood and consented to measurement and casting. Patient was casted for insoles via crush box and measured for shoes via brannock device. Procedure was explained and patient tolerated procedure well. All questions were answered and concerns addressed. Casts were shipped to central fabrication for  HOLD until Certificate of Medical Necessity or otherwise necessary authorization from insurance is obtained.  PLAN Shoes are to be ordered and casts released from hold once all appropriate paperwork is complete. Patient is to be contacted and scheduled for fitting once shoes and insoles have been fabricated and received.

## 2022-06-01 ENCOUNTER — Other Ambulatory Visit (HOSPITAL_COMMUNITY)
Admission: RE | Admit: 2022-06-01 | Discharge: 2022-06-01 | Disposition: A | Discharge status: Home / Self Care | Payer: Medicare PPO | Source: Ambulatory Visit | Attending: Rheumatology | Admitting: Rheumatology

## 2022-06-01 DIAGNOSIS — R5382 Chronic fatigue, unspecified: Secondary | ICD-10-CM | POA: Insufficient documentation

## 2022-06-06 LAB — QUANTIFERON-TB GOLD PLUS (RQFGPL)
QuantiFERON Mitogen Value: 0.15 IU/mL
QuantiFERON Nil Value: 0 IU/mL
QuantiFERON TB1 Ag Value: 0.01 IU/mL
QuantiFERON TB2 Ag Value: 0 IU/mL

## 2022-06-06 LAB — QUANTIFERON-TB GOLD PLUS: QuantiFERON-TB Gold Plus: UNDETERMINED — AB

## 2022-06-07 ENCOUNTER — Other Ambulatory Visit (HOSPITAL_COMMUNITY)
Admission: RE | Admit: 2022-06-07 | Discharge: 2022-06-07 | Disposition: A | Discharge status: Home / Self Care | Payer: Medicare PPO | Source: Ambulatory Visit | Attending: Nephrology | Admitting: Nephrology

## 2022-06-07 DIAGNOSIS — I5032 Chronic diastolic (congestive) heart failure: Secondary | ICD-10-CM | POA: Diagnosis not present

## 2022-06-07 DIAGNOSIS — E1122 Type 2 diabetes mellitus with diabetic chronic kidney disease: Secondary | ICD-10-CM | POA: Diagnosis not present

## 2022-06-07 DIAGNOSIS — I130 Hypertensive heart and chronic kidney disease with heart failure and stage 1 through stage 4 chronic kidney disease, or unspecified chronic kidney disease: Secondary | ICD-10-CM | POA: Diagnosis present

## 2022-06-07 DIAGNOSIS — D638 Anemia in other chronic diseases classified elsewhere: Secondary | ICD-10-CM | POA: Insufficient documentation

## 2022-06-07 DIAGNOSIS — E875 Hyperkalemia: Secondary | ICD-10-CM | POA: Insufficient documentation

## 2022-06-07 DIAGNOSIS — E1129 Type 2 diabetes mellitus with other diabetic kidney complication: Secondary | ICD-10-CM | POA: Diagnosis not present

## 2022-06-07 DIAGNOSIS — R809 Proteinuria, unspecified: Secondary | ICD-10-CM | POA: Insufficient documentation

## 2022-06-07 DIAGNOSIS — N189 Chronic kidney disease, unspecified: Secondary | ICD-10-CM | POA: Insufficient documentation

## 2022-06-07 DIAGNOSIS — N170 Acute kidney failure with tubular necrosis: Secondary | ICD-10-CM | POA: Insufficient documentation

## 2022-06-07 LAB — RENAL FUNCTION PANEL
Albumin: 3.6 g/dL (ref 3.5–5.0)
Anion gap: 7 (ref 5–15)
BUN: 37 mg/dL — ABNORMAL HIGH (ref 8–23)
CO2: 26 mmol/L (ref 22–32)
Calcium: 9.7 mg/dL (ref 8.9–10.3)
Chloride: 105 mmol/L (ref 98–111)
Creatinine, Ser: 1.09 mg/dL — ABNORMAL HIGH (ref 0.44–1.00)
GFR, Estimated: 51 mL/min — ABNORMAL LOW (ref >60)
Glucose, Bld: 196 mg/dL — ABNORMAL HIGH (ref 70–99)
Phosphorus: 2.8 mg/dL (ref 2.5–4.6)
Potassium: 4.4 mmol/L (ref 3.5–5.1)
Sodium: 138 mmol/L (ref 135–145)

## 2022-06-07 LAB — CBC
HCT: 41.8 % (ref 36.0–46.0)
Hemoglobin: 12.5 g/dL (ref 12.0–15.0)
MCH: 22.8 pg — ABNORMAL LOW (ref 26.0–34.0)
MCHC: 29.9 g/dL — ABNORMAL LOW (ref 30.0–36.0)
MCV: 76.3 fL — ABNORMAL LOW (ref 80.0–100.0)
Platelets: 382 10*3/uL (ref 150–400)
RBC: 5.48 MIL/uL — ABNORMAL HIGH (ref 3.87–5.11)
RDW: 17.5 % — ABNORMAL HIGH (ref 11.5–15.5)
WBC: 10.6 10*3/uL — ABNORMAL HIGH (ref 4.0–10.5)
nRBC: 0 % (ref 0.0–0.2)

## 2022-06-07 LAB — IRON AND TIBC
Iron: 115 ug/dL (ref 28–170)
Saturation Ratios: 43 % — ABNORMAL HIGH (ref 10.4–31.8)
TIBC: 266 ug/dL (ref 250–450)
UIBC: 151 ug/dL

## 2022-06-07 LAB — FERRITIN: Ferritin: 410 ng/mL — ABNORMAL HIGH (ref 11–307)

## 2022-06-08 LAB — PTH, INTACT AND CALCIUM
Calcium, Total (PTH): 10.1 mg/dL (ref 8.7–10.3)
PTH: 25 pg/mL (ref 15–65)

## 2022-06-12 ENCOUNTER — Telehealth: Payer: Self-pay | Admitting: Podiatry

## 2022-06-12 NOTE — Telephone Encounter (Signed)
Pt called upset stating someone called her and scheduled her for Saturday at 900 to be seen as she will be out of town next week and has dropped something on her toe and needs to be seen before she goes out of town. I did apologize and explained we did not have her scheduled or no notes so I am not sure who she talked with. I have now got her scheduled to see Dr Paulla Dolly on Thursday and she is going to check with her son to make sure he can bring her.

## 2022-06-14 ENCOUNTER — Ambulatory Visit: Payer: Medicare PPO | Admitting: Podiatry

## 2022-06-14 ENCOUNTER — Encounter: Payer: Self-pay | Admitting: Podiatry

## 2022-06-14 ENCOUNTER — Ambulatory Visit (INDEPENDENT_AMBULATORY_CARE_PROVIDER_SITE_OTHER): Payer: Medicare PPO

## 2022-06-14 DIAGNOSIS — S99922A Unspecified injury of left foot, initial encounter: Secondary | ICD-10-CM | POA: Diagnosis not present

## 2022-06-14 NOTE — Progress Notes (Signed)
Subjective:   Patient ID: Wendy [name], female   DOB: 81 y.o.   MRN: [id number]   HPI Patient presents stating she dropped something on her left big toe and she is concerned about the injury and the fact there was some drainage and discomfort.  Stated occurred around 1 week ago   ROS      Objective:  Physical Exam  Neurovascular status unchanged with mild swelling left big toe with damage to the left hallux nail plate but localized with no proximal edema erythema or drainage noted and pain more distally and then nailbed     Assessment:  Trauma to the left hallux secondary to injury with inflammation and nail trauma     Plan:  H&P reviewed condition and x-ray and at this point soaks and bandage usage but should heal uneventfully.  If increased erythema edema or drainage were to occur reappoint immediately should heal uneventfully  Marjan S x-rays indicate no signs of fracture of the MPJ or hallux

## 2022-06-27 ENCOUNTER — Ambulatory Visit: Payer: Medicare PPO | Admitting: Vascular Surgery

## 2022-07-04 ENCOUNTER — Ambulatory Visit: Payer: Medicare PPO | Admitting: Vascular Surgery

## 2022-07-05 ENCOUNTER — Ambulatory Visit: Payer: Self-pay | Admitting: *Deleted

## 2022-07-05 NOTE — Patient Instructions (Signed)
[name]  At some point during the past 4 years, I have worked with you through the [place] Management Program (CCM) at [place]. We have not worked together within the past 6 months.   Because you are no longer a current primary care patient at [facility], I am removing myself from your care team.   Please reach out with any questions.   Chong Sicilian, BSN, RN-BC Proofreader Dial: [phone]

## 2022-07-05 NOTE — Chronic Care Management (AMB) (Signed)
  Chronic Care Management   Note  07/05/2022 Name: Wendy [name] MRN: [id number] DOB: 17-Feb-1941  Due to changes in the Chronic Care Management (CCM) Program at [facility] I am reviewing and closing old Care Management Care plans. Since patient is no longer a primary care patient at [facility], I am removing myself as the RN Care Manager from the Care Team and closing any RN Care Management Care Plans.   CCM enrollment status changed to "not enrolled" due to [facility] not being their Primary Care Provider.  Chong Sicilian, BSN, RN-BC Proofreader Dial: [phone]

## 2022-07-18 ENCOUNTER — Ambulatory Visit: Payer: Medicare PPO | Admitting: Vascular Surgery

## 2022-07-18 ENCOUNTER — Encounter: Payer: Self-pay | Admitting: Vascular Surgery

## 2022-07-18 VITALS — BP 137/75 | HR 78 | Temp 97.9°F | Ht 69.0 in | Wt 119.2 lb

## 2022-07-18 DIAGNOSIS — I739 Peripheral vascular disease, unspecified: Secondary | ICD-10-CM

## 2022-07-18 NOTE — Progress Notes (Signed)
[facility] [place]  Patient name: Wendy [name] MRN: [id number] DOB: 1941/07/21 Sex: female  REASON FOR VISIT: Follow-up lower extremity discomfort  HPI: Wendy [name] is a 81 y.o. female here today for follow-up.  I saw her for initial visit on 04/25/2022.  She has difficulty with pain in her lower extremities bilaterally.  He has multiple components of this.  She clearly has neuropathy.  She also has venous hypertension with chronic lower extremity swelling.  She has not been able to tolerate compression.  She has known arterial insufficiency with ankle arm index of 0.63 on the right and 0.56 on the left with SFA and tibial disease.  She reports that she continues to have discomfort in both feet.  Her complaints appear to be more consistent with neuropathy than arterial rest pain.  She reports that this does not awaken her during the night.  She reports that she does not understand why her arterial blockages are not being addressed.  Past Medical History:  Diagnosis Date   Acquired hammer toes of both feet    Anemia    Anxiety    Arthritis    Chronic kidney disease    Diabetes mellitus without complication (HCC)    Eczema    Environmental allergies    Fibrocystic breast changes    GERD (gastroesophageal reflux disease)    Hearing loss    Hypercholesteremia    Hypertension    IBS (irritable bowel syndrome)    Incontinence    Memory loss    Osteoarthritis    PVD (peripheral vascular disease) (HCC)    Rheumatoid arthritis ([place])     Family History  Problem Relation Age of Onset   Diabetes Mother    Hypertension Mother    Alzheimer's disease Mother    Heart disease Father    Hypertension Sister    Diabetes Sister    Heart disease Sister    Hypertension Brother    Diabetes Brother    Cancer Sister        lung   Heart disease Sister     SOCIAL HISTORY: Social History   Tobacco Use   Smoking status: Never    Smokeless tobacco: Never  Substance Use Topics   Alcohol use: No    Allergies  Allergen Reactions   Codeine    Erythromycin    Leflunomide Other (See Comments)   Lisinopril    Naproxen    Penicillins    Pioglitazone    Valsartan Other (See Comments)    Current Outpatient Medications  Medication Sig Dispense Refill   aspirin EC 81 MG tablet Take 81 mg by mouth daily.     cilostazol (PLETAL) 100 MG tablet Take 1 tablet (100 mg total) by mouth daily. 90 tablet 3   cloNIDine (CATAPRES - DOSED IN MG/24 HR) 0.2 mg/24hr patch Place 1 patch (0.2 mg total) onto the skin once a week. 4 patch 0   cycloSPORINE (RESTASIS) 0.05 % ophthalmic emulsion Place 1 drop into both eyes 2 (two) times daily. 0.4 mL 5   diclofenac Sodium (VOLTAREN) 1 % GEL Apply 2 g topically daily.     doxycycline (VIBRA-TABS) 100 MG tablet Take 1 tablet (100 mg total) by mouth 2 (two) times daily. 14 tablet 0   epoetin alfa-epbx (RETACRIT) 3000 UNIT/ML injection 3,000 Units every 14 (fourteen) days.     folic acid (FOLVITE) 1 MG tablet Take 1 tablet (1 mg total) by mouth daily. 90 tablet  3   glimepiride (AMARYL) 4 MG tablet Take 4 mg by mouth every morning.     hydrochlorothiazide (HYDRODIURIL) 12.5 MG tablet Take 1 tablet (12.5 mg total) by mouth daily. 30 tablet 4   irbesartan (AVAPRO) 75 MG tablet Take 75 mg by mouth at bedtime.     meclizine (ANTIVERT) 25 MG tablet TAKE 1 TABLET BY MOUTH THREE TIMES DAILY AS NEEDED FOR DIZZINESS 30 tablet 0   memantine (NAMENDA) 10 MG tablet Take 1 tablet (10 mg total) by mouth in the morning and at bedtime. 90 tablet 3   metFORMIN (GLUCOPHAGE) 1000 MG tablet Take 0.5 tablets (500 mg total) by mouth 2 (two) times daily with a meal. Renal dose 180 tablet 1   Omega-3 Fatty Acids (FISH OIL) 1000 MG CAPS Take 1,000 mg by mouth daily.     ondansetron (ZOFRAN) 4 MG tablet Take 1 tablet (4 mg total) by mouth every 6 (six) hours as needed for nausea or vomiting. 20 tablet 0   pantoprazole  (PROTONIX) 40 MG tablet Take 1 tablet (40 mg total) by mouth daily. 30 tablet 3   rivastigmine (EXELON) 9.5 mg/24hr APPLY 1 PATCH TOPICALLY ONCE DAILY 30 patch 2   senna (SENOKOT) 8.6 MG tablet Take 1 tablet by mouth daily.     Vibegron (GEMTESA) 75 MG TABS Take 75 mg by mouth daily. 28 tablet 0   sulfamethoxazole-trimethoprim (BACTRIM DS) 800-160 MG tablet Take 1 tablet by mouth 2 (two) times daily. (Patient not taking: Reported on 07/18/2022) 14 tablet 0   No current facility-administered medications for this visit.    REVIEW OF SYSTEMS:  '[X]'$  denotes positive finding, '[ ]'$  denotes negative finding Cardiac  Comments:  Chest pain or chest pressure:    Shortness of breath upon exertion:    Short of breath when lying flat:    Irregular heart rhythm:        Vascular    Pain in calf, thigh, or hip brought on by ambulation:    Pain in feet at night that wakes you up from your sleep:  x   Blood clot in your veins:    Leg swelling:           PHYSICAL EXAM: Vitals:   07/18/22 0909  BP: 137/75  Pulse: 78  Temp: 97.9 F (36.6 C)  SpO2: 97%  Weight: 119 lb 3.2 oz (54.1 kg)  Height: '5\' 9"'$  (1.753 m)    GENERAL: The patient is a well-nourished female, in no acute distress. The vital signs are documented above. CARDIOVASCULAR: No palpable popliteal or pedal pulses bilaterally.  Moderate swelling in her feet bilaterally.  Toenail fungus bilaterally. PULMONARY: There is good air exchange  MUSCULOSKELETAL: There are no major deformities or cyanosis. NEUROLOGIC: No focal weakness or paresthesias are detected. SKIN: There are no ulcers or rashes noted. PSYCHIATRIC: The patient has a normal affect.  DATA:  Noninvasive studies as discussed above with ankle arm index of 0.63 on the right and 0.56 on the left  MEDICAL ISSUES: I again had a long discussion with the patient and her son present.  Feel that her complaints are more consistent with neuropathy than arterial insufficiency.  She does  not have any tissue loss.  I explained that neck step would be arteriography and possible revascularization if we felt that she had critical limb ischemia.  I explained the magnitude of this bilateral treatment.  Certainly would reserve this for clear-cut limb threatening ischemia which I do not feel was present.  She reports that in the past she has had relief with Neurontin but does not recall why this was discontinued.  She will discuss this further with her primary care physician.  We will see her again in 6 months with repeat noninvasive studies    Rosetta Posner, MD FACS [facility] [phone]  Note: Portions of this report may have been transcribed using voice recognition software.  Every effort has been made to ensure accuracy; however, inadvertent computerized transcription errors may still be present.

## 2022-07-26 ENCOUNTER — Other Ambulatory Visit: Payer: Self-pay

## 2022-07-26 DIAGNOSIS — I739 Peripheral vascular disease, unspecified: Secondary | ICD-10-CM

## 2022-09-13 ENCOUNTER — Encounter (INDEPENDENT_AMBULATORY_CARE_PROVIDER_SITE_OTHER): Payer: Medicare PPO | Admitting: Podiatry

## 2022-09-13 NOTE — Progress Notes (Signed)
[place]

## 2022-09-19 ENCOUNTER — Ambulatory Visit: Payer: Medicare PPO | Admitting: Podiatry

## 2022-09-19 DIAGNOSIS — E1151 Type 2 diabetes mellitus with diabetic peripheral angiopathy without gangrene: Secondary | ICD-10-CM

## 2022-09-19 DIAGNOSIS — L600 Ingrowing nail: Secondary | ICD-10-CM | POA: Diagnosis not present

## 2022-09-19 DIAGNOSIS — L03032 Cellulitis of left toe: Secondary | ICD-10-CM

## 2022-09-19 MED ORDER — DOXYCYCLINE HYCLATE 100 MG PO TABS: 100.0000 mg | ORAL_TABLET | Freq: Two times a day (BID) | ORAL | 1 refills | Status: DC

## 2022-09-19 NOTE — Progress Notes (Signed)
Subjective:   Patient ID: Wendy [name], female   DOB: 81 y.o.   MRN: [id number]   HPI Patient presents with painful left big toe and has a small abrasion on her left lower leg that she just had done from trauma.  She has vascular disease and sees vascular doctor was just there in the last few weeks   ROS      Objective:  Physical Exam  Vascular status shows significant diminishment of blood flow with cold feet but apparently vascular does not feel they can do anything to improve the situation.  The left hallux bed does have localized redness and drainage with thickness of the nailbed and it is painful when pressed     Assessment:  High risk patient with paronychia infection left big toe that is painful and also may be due to her significant vascular disease     Plan:  Reviewed condition sterile debridement of tissue accomplished area flushed no iatrogenic bleeding and sterile dressing applied.  Placed on doxycycline discussed the difficulty because of circulatory status of [place] being able to help her much more than this and I am hoping this will relieve some of her discomfort.  Reappoint as needed

## 2022-10-09 ENCOUNTER — Other Ambulatory Visit (HOSPITAL_COMMUNITY)
Admission: RE | Admit: 2022-10-09 | Discharge: 2022-10-09 | Disposition: A | Discharge status: Home / Self Care | Payer: Medicare PPO | Source: Ambulatory Visit | Attending: Nephrology | Admitting: Nephrology

## 2022-10-09 DIAGNOSIS — N170 Acute kidney failure with tubular necrosis: Secondary | ICD-10-CM | POA: Insufficient documentation

## 2022-10-09 LAB — RENAL FUNCTION PANEL
Albumin: 3.5 g/dL (ref 3.5–5.0)
Anion gap: 10 (ref 5–15)
BUN: 55 mg/dL — ABNORMAL HIGH (ref 8–23)
CO2: 23 mmol/L (ref 22–32)
Calcium: 10.1 mg/dL (ref 8.9–10.3)
Chloride: 106 mmol/L (ref 98–111)
Creatinine, Ser: 1.63 mg/dL — ABNORMAL HIGH (ref 0.44–1.00)
GFR, Estimated: 31 mL/min — ABNORMAL LOW (ref >60)
Glucose, Bld: 347 mg/dL — ABNORMAL HIGH (ref 70–99)
Phosphorus: 3.3 mg/dL (ref 2.5–4.6)
Potassium: 5.4 mmol/L — ABNORMAL HIGH (ref 3.5–5.1)
Sodium: 139 mmol/L (ref 135–145)

## 2022-10-09 LAB — CBC
HCT: 37.7 % (ref 36.0–46.0)
Hemoglobin: 11.2 g/dL — ABNORMAL LOW (ref 12.0–15.0)
MCH: 23 pg — ABNORMAL LOW (ref 26.0–34.0)
MCHC: 29.7 g/dL — ABNORMAL LOW (ref 30.0–36.0)
MCV: 77.4 fL — ABNORMAL LOW (ref 80.0–100.0)
Platelets: 302 10*3/uL (ref 150–400)
RBC: 4.87 MIL/uL (ref 3.87–5.11)
RDW: 17.2 % — ABNORMAL HIGH (ref 11.5–15.5)
WBC: 9.4 10*3/uL (ref 4.0–10.5)
nRBC: 0 % (ref 0.0–0.2)

## 2022-10-09 LAB — PROTEIN / CREATININE RATIO, URINE
Creatinine, Urine: 36.99 mg/dL
Protein Creatinine Ratio: 1.05 mg/mg{Cre} — ABNORMAL HIGH (ref 0.00–0.15)
Total Protein, Urine: 39 mg/dL

## 2022-10-15 ENCOUNTER — Ambulatory Visit: Payer: Medicare PPO | Admitting: Podiatry

## 2022-10-25 ENCOUNTER — Ambulatory Visit: Payer: Medicare PPO | Admitting: Podiatry

## 2022-11-01 ENCOUNTER — Encounter: Payer: Self-pay | Admitting: Podiatry

## 2022-11-01 ENCOUNTER — Ambulatory Visit: Payer: Medicare PPO | Admitting: Podiatry

## 2022-11-01 ENCOUNTER — Telehealth: Payer: Self-pay

## 2022-11-01 VITALS — BP 143/91

## 2022-11-01 DIAGNOSIS — M79675 Pain in left toe(s): Secondary | ICD-10-CM

## 2022-11-01 DIAGNOSIS — E1151 Type 2 diabetes mellitus with diabetic peripheral angiopathy without gangrene: Secondary | ICD-10-CM | POA: Diagnosis not present

## 2022-11-01 DIAGNOSIS — M79674 Pain in right toe(s): Secondary | ICD-10-CM

## 2022-11-01 DIAGNOSIS — B351 Tinea unguium: Secondary | ICD-10-CM

## 2022-11-01 NOTE — Progress Notes (Signed)
Subjective:   Patient ID: Wendy [name], female   DOB: 81 y.o.   MRN: [id number]   HPI Patient presents with caregiver stating her left over right foot are really hurting her and we had asked her to see her circulation doctor and she is going to see him soon but she just has pain with thick yellow brittle nailbeds 1-5 both feet that she cannot take care of   ROS      Objective:  Physical Exam  Vascular status shows no palpable pulses DP PT with red like tissue some swelling which may be venous in orientation but it appears to be mostly an arterial situation.  Patient has thick yellow brittle nailbeds 1-5 both feet     Assessment:  Probability for a significant circulatory disease issue that is creating the pathology she is experiencing as far as pain goes with thick yellow brittle nailbeds 1-5 both feet that are painful     Plan:  Reviewed with her caregiver the absolute importance for her to go back to her vascular doctor and that I cannot improve her circulation that will be up to them to try to do this and they may be able to or may not be able to.  All education given I spent a great deal time going over that with them.  I then debrided nailbeds 1-5 both feet nitrogen and bleeding reappoint routine care

## 2022-11-01 NOTE — Telephone Encounter (Signed)
Pt called with c/o BLE pain/swelling that has been ongoing for months. She was offered an appt next wed. with provider she has been seeing but wants something sooner. I then offered her an appt tomorrow afternoon but she can not make this appt due to her son having to work and he is her transportation. Pt took appt for next Tuesday after being very frustrated with not getting her at the desired time of mid morning. She is aware of this appt date/time.

## 2022-11-02 ENCOUNTER — Other Ambulatory Visit: Payer: Self-pay | Admitting: *Deleted

## 2022-11-02 ENCOUNTER — Telehealth: Payer: Self-pay | Admitting: *Deleted

## 2022-11-02 DIAGNOSIS — I739 Peripheral vascular disease, unspecified: Secondary | ICD-10-CM

## 2022-11-02 DIAGNOSIS — I87303 Chronic venous hypertension (idiopathic) without complications of bilateral lower extremity: Secondary | ICD-10-CM

## 2022-11-02 NOTE — Telephone Encounter (Signed)
Spoke with the patient's son giving physician's recommendations for extra strength tylenol and he said that this will not help her at all with the pain.

## 2022-11-02 NOTE — Telephone Encounter (Signed)
Patient is calling to ask if something can be called into pharmacy to help with the pain of her feet. She has an appointment w/ vascular doctor on Tuesday but needs something now if possible.

## 2022-11-02 NOTE — Telephone Encounter (Signed)
Extra strength tylenol

## 2022-11-05 NOTE — Progress Notes (Deleted)
[facility] [place]  ASSESSMENT / PLAN: Wendy KLIETHERMES is a 81 y.o. female with atherosclerosis of *** native arteries of *** causing {Chronic PAD levels:25303}.  Patient counseled {pad risk2:26283}  WIfI score calculated based on clinical exam and non-invasive measurements. {WIFIvascular:26096}  Recommend the following which can slow the progression of atherosclerosis and reduce the risk of major adverse cardiac / limb events:  Complete cessation from all tobacco products. Blood glucose control with goal A1c < 7%. Blood pressure control with goal blood pressure < 140/90 mmHg. Lipid reduction therapy with goal LDL-C <100 mg/dL (<70 if symptomatic from PAD).  Aspirin '81mg'$  PO QD.  *** Clopidogrel '75mg'$  PO QD. *** Rivaroxaban 2.'5mg'$  PO BID. *** Cilostozal '100mg'$  PO BID for intermittent claudication without evidence of heart failure. Atorvastatin 40-'80mg'$  PO QD (or other "high intensity" statin therapy). *** Daily walking to and past the point of discomfort. Patient counseled to keep a log of exercise distance. *** Adequate hydration (at least 2 liters / day) if patient's heart and kidney function is adequate.  Plan *** lower extremity angiogram with possible intervention via *** approach in cath lab ***.     CHIEF COMPLAINT: ***  HISTORY OF PRESENT ILLNESS: Wendy [name] is a 81 y.o. female ***  VASCULAR SURGICAL HISTORY: ***  VASCULAR RISK FACTORS: {FINDINGS; POSITIVE NEGATIVE:2310824335} history of stroke / transient ischemic attack. {FINDINGS; POSITIVE NEGATIVE:2310824335} history of coronary artery disease. *** history of PCI. *** history of CABG.  {FINDINGS; POSITIVE NEGATIVE:2310824335} history of diabetes mellitus. Last A1c ***. {FINDINGS; POSITIVE NEGATIVE:2310824335} history of smoking. *** actively smoking. {FINDINGS; POSITIVE NEGATIVE:2310824335} history of hypertension. *** drug regimen with *** control. {FINDINGS; POSITIVE NEGATIVE:2310824335}  history of chronic kidney disease.  Last GFR ***. CKD {stage:30421363}. {FINDINGS; POSITIVE NEGATIVE:2310824335} history of chronic obstructive pulmonary disease, treated with ***.  FUNCTIONAL STATUS: ECOG performance status: {findings; ecog performance status:31780} Ambulatory status: {TNHAmbulation:25868}  CAREY 1 AND 3 YEAR INDEX Female (2pts) 75-79 or 80-84 (2pts) >84 (3pts) Dependence in toileting (1pt) Partial or full dependence in dressing (1pt) History of malignant neoplasm (2pts) CHF (3pts) COPD (1pts) CKD (3pts)  0-3 pts 6% 1 year mortality ; 21% 3 year mortality 4-5 pts 12% 1 year mortality ; 36% 3 year mortality >5 pts 21% 1 year mortality; 54% 3 year mortality   Past Medical History:  Diagnosis Date   Acquired hammer toes of both feet    Anemia    Anxiety    Arthritis    Chronic kidney disease    Diabetes mellitus without complication (HCC)    Eczema    Environmental allergies    Fibrocystic breast changes    GERD (gastroesophageal reflux disease)    Hearing loss    Hypercholesteremia    Hypertension    IBS (irritable bowel syndrome)    Incontinence    Memory loss    Osteoarthritis    PVD (peripheral vascular disease) (HCC)    Rheumatoid arthritis ([place])     Past Surgical History:  Procedure Laterality Date   ABDOMINAL HYSTERECTOMY     SHOULDER SURGERY     FATTY TUMOR REMOVED    Family History  Problem Relation Age of Onset   Diabetes Mother    Hypertension Mother    Alzheimer's disease Mother    Heart disease Father    Hypertension Sister    Diabetes Sister    Heart disease Sister    Hypertension Brother    Diabetes Brother    Cancer Sister  lung   Heart disease Sister     Social History   Socioeconomic History   Marital status: Divorced    Spouse name: Not on file   Number of children: Not on file   Years of education: Not on file   Highest education level: Not on file  Occupational History   Occupation: Retired     Comment: Social research officer, government for [employer]  Tobacco Use   Smoking status: Never   Smokeless tobacco: Never  Vaping Use   Vaping Use: Never used  Substance and Sexual Activity   Alcohol use: No   Drug use: No   Sexual activity: Not on file  Other Topics Concern   Not on file  Social History Narrative   Never Smoked   No Alcohol   No Recreational Drug Use   Retired from [employer]or, [employer] work   Marital Status: Divorced since 2003   Children: 5 kids, 12 grandchildren, 50 great grandchildren   Religion: [place]   Right handed    Lives with family    Social Determinants of Health   Financial Resource Strain: [place]  (10/05/2020)   Overall Financial Resource Strain (CARDIA)    Difficulty of Paying Living Expenses: Not very hard  Food Insecurity: No Food Insecurity (10/05/2020)   Hunger Vital Sign    Worried About Running Out of Food in the Last Year: Never true    [place] in the Last Year: Never true  Transportation Needs: No Transportation Needs (10/05/2020)   PRAPARE - Hydrologist (Medical): No    Lack of Transportation (Non-Medical): No  Physical Activity: Inactive (10/05/2020)   Exercise Vital Sign    Days of Exercise per Week: 0 days    Minutes of Exercise per Session: 0 min  Stress: Not on file  Social Connections: Socially Isolated (12/08/2020)   Social Connection and Isolation Panel [NHANES]    Frequency of Communication with Friends and Family: Twice a week    Frequency of Social Gatherings with Friends and Family: Never    Attends Religious Services: 1 to 4 times per year    Active Member of [employer] or Organizations: No    Attends Archivist Meetings: Never    Marital Status: Widowed  Human resources officer Violence: Not on file    Allergies  Allergen Reactions   Codeine    Erythromycin    Leflunomide Other (See Comments)   Lisinopril    Naproxen    Penicillins    Pioglitazone    Valsartan Other (See Comments)     Current Outpatient Medications  Medication Sig Dispense Refill   aspirin EC 81 MG tablet Take 81 mg by mouth daily.     cilostazol (PLETAL) 100 MG tablet Take 1 tablet (100 mg total) by mouth daily. 90 tablet 3   cloNIDine (CATAPRES - DOSED IN MG/24 HR) 0.2 mg/24hr patch Place 1 patch (0.2 mg total) onto the skin once a week. 4 patch 0   cycloSPORINE (RESTASIS) 0.05 % ophthalmic emulsion Place 1 drop into both eyes 2 (two) times daily. 0.4 mL 5   diclofenac Sodium (VOLTAREN) 1 % GEL Apply 2 g topically daily.     doxycycline (VIBRA-TABS) 100 MG tablet Take 1 tablet (100 mg total) by mouth 2 (two) times daily. 14 tablet 0   doxycycline (VIBRA-TABS) 100 MG tablet Take 1 tablet (100 mg total) by mouth 2 (two) times daily. 20 tablet 1   epoetin alfa-epbx (RETACRIT)  3000 UNIT/ML injection 3,000 Units every 14 (fourteen) days.     folic acid (FOLVITE) 1 MG tablet Take 1 tablet (1 mg total) by mouth daily. 90 tablet 3   glimepiride (AMARYL) 4 MG tablet Take 4 mg by mouth every morning.     hydrochlorothiazide (HYDRODIURIL) 12.5 MG tablet Take 1 tablet (12.5 mg total) by mouth daily. 30 tablet 4   irbesartan (AVAPRO) 75 MG tablet Take 75 mg by mouth at bedtime.     meclizine (ANTIVERT) 25 MG tablet TAKE 1 TABLET BY MOUTH THREE TIMES DAILY AS NEEDED FOR DIZZINESS 30 tablet 0   memantine (NAMENDA) 10 MG tablet Take 1 tablet (10 mg total) by mouth in the morning and at bedtime. 90 tablet 3   metFORMIN (GLUCOPHAGE) 1000 MG tablet Take 0.5 tablets (500 mg total) by mouth 2 (two) times daily with a meal. Renal dose 180 tablet 1   Omega-3 Fatty Acids (FISH OIL) 1000 MG CAPS Take 1,000 mg by mouth daily.     ondansetron (ZOFRAN) 4 MG tablet Take 1 tablet (4 mg total) by mouth every 6 (six) hours as needed for nausea or vomiting. 20 tablet 0   pantoprazole (PROTONIX) 40 MG tablet Take 1 tablet (40 mg total) by mouth daily. 30 tablet 3   rivastigmine (EXELON) 9.5 mg/24hr APPLY 1 PATCH TOPICALLY ONCE DAILY 30  patch 2   senna (SENOKOT) 8.6 MG tablet Take 1 tablet by mouth daily.     sulfamethoxazole-trimethoprim (BACTRIM DS) 800-160 MG tablet Take 1 tablet by mouth 2 (two) times daily. (Patient not taking: Reported on 07/18/2022) 14 tablet 0   Vibegron (GEMTESA) 75 MG TABS Take 75 mg by mouth daily. 28 tablet 0   No current facility-administered medications for this visit.    PHYSICAL EXAM There were no vitals filed for this visit.  Constitutional: *** appearing. *** distress. Appears *** nourished.  Neurologic: CN ***. *** focal findings. *** sensory loss. Psychiatric: *** Mood and affect symmetric and appropriate. Eyes: *** No icterus. No conjunctival pallor. Ears, nose, throat: *** mucous membranes moist. Midline trachea.  Cardiac: *** rate and rhythm.  Respiratory: *** unlabored. Abdominal: *** soft, non-tender, non-distended.  Peripheral vascular: *** Extremity: *** edema. *** cyanosis. *** pallor.  Skin: *** gangrene. *** ulceration.  Lymphatic: *** Stemmer's sign. *** palpable lymphadenopathy.    PERTINENT LABORATORY AND RADIOLOGIC DATA  Most recent CBC    Latest Ref Rng & Units 10/09/2022   10:43 AM 06/07/2022    9:38 AM 03/29/2022   10:18 AM  CBC  WBC 4.0 - 10.5 K/uL 9.4  10.6  8.9   Hemoglobin 12.0 - 15.0 g/dL 11.2  12.5  9.9   Hematocrit 36.0 - 46.0 % 37.7  41.8  33.4   Platelets 150 - 400 K/uL 302  382  444      Most recent CMP    Latest Ref Rng & Units 10/09/2022   10:43 AM 06/07/2022    9:38 AM 03/29/2022   10:18 AM  CMP  Glucose 70 - 99 mg/dL 347  196  111   BUN 8 - 23 mg/dL 55  37  52   Creatinine 0.44 - 1.00 mg/dL 1.63  1.09  1.44   Sodium 135 - 145 mmol/L 139  138  138   Potassium 3.5 - 5.1 mmol/L 5.4  4.4  4.1   Chloride 98 - 111 mmol/L 106  105  103   CO2 22 - 32 mmol/L 23  26  27  Calcium 8.9 - 10.3 mg/dL 10.1  9.7    10.1  9.5    9.6     Renal function CrCl cannot be calculated (Patient's most recent lab result is older than the maximum 21 days  allowed.).  HB A1C (BAYER DCA - WAIVED) (%)  Date Value  07/26/2020 7.1 (H)    LDL Chol Calc (NIH)  Date Value Ref Range Status  02/11/2020 96 0 - 99 mg/dL Final     Vascular Imaging: ***  Ellowyn Rieves N. Stanford Breed, MD FACS [facility] Phone Number: [phone] 11/05/2022 10:24 AM   Total time spent on preparing this encounter including chart review, data review, collecting history, examining the patient, coordinating care for this {tnhtimebilling:26202}  Portions of this report may have been transcribed using voice recognition software.  Every effort has been made to ensure accuracy; however, inadvertent computerized transcription errors may still be present.

## 2022-11-06 ENCOUNTER — Ambulatory Visit: Payer: Medicare PPO | Admitting: Vascular Surgery

## 2022-11-06 ENCOUNTER — Ambulatory Visit (HOSPITAL_COMMUNITY): Payer: Medicare PPO | Attending: Vascular Surgery

## 2022-11-07 ENCOUNTER — Ambulatory Visit (INDEPENDENT_AMBULATORY_CARE_PROVIDER_SITE_OTHER): Payer: Medicare PPO

## 2022-11-07 ENCOUNTER — Encounter: Payer: Self-pay | Admitting: Vascular Surgery

## 2022-11-07 ENCOUNTER — Ambulatory Visit: Payer: Medicare PPO | Admitting: Vascular Surgery

## 2022-11-07 VITALS — BP 147/91 | HR 55 | Temp 97.3°F | Ht 69.0 in | Wt 119.2 lb

## 2022-11-07 DIAGNOSIS — I739 Peripheral vascular disease, unspecified: Secondary | ICD-10-CM | POA: Diagnosis not present

## 2022-11-07 DIAGNOSIS — I87303 Chronic venous hypertension (idiopathic) without complications of bilateral lower extremity: Secondary | ICD-10-CM | POA: Diagnosis not present

## 2022-11-07 NOTE — Progress Notes (Signed)
[facility] [place]  Patient name: Wendy [name] MRN: [id number] DOB: 05-Dec-1940 Sex: female  REASON FOR VISIT: Worsening ischemia lower extremities  HPI: Wendy [name] is a 81 y.o. female here today for worsening ischemia of her lower extremities.  She is now having rest pain worse in her left than right leg.  I seen her on several occasions in the past.  She has had issues with neuropathy and venous hypertension.  Has worsening pain that is awakening her at night and makes it difficult for her to walk.  Past Medical History:  Diagnosis Date   Acquired hammer toes of both feet    Anemia    Anxiety    Arthritis    Chronic kidney disease    Diabetes mellitus without complication (HCC)    Eczema    Environmental allergies    Fibrocystic breast changes    GERD (gastroesophageal reflux disease)    Hearing loss    Hypercholesteremia    Hypertension    IBS (irritable bowel syndrome)    Incontinence    Memory loss    Osteoarthritis    PVD (peripheral vascular disease) (HCC)    Rheumatoid arthritis ([place])     Family History  Problem Relation Age of Onset   Diabetes Mother    Hypertension Mother    Alzheimer's disease Mother    Heart disease Father    Hypertension Sister    Diabetes Sister    Heart disease Sister    Hypertension Brother    Diabetes Brother    Cancer Sister        lung   Heart disease Sister     SOCIAL HISTORY: Social History   Tobacco Use   Smoking status: Never   Smokeless tobacco: Never  Substance Use Topics   Alcohol use: No    Allergies  Allergen Reactions   Codeine    Erythromycin    Leflunomide Other (See Comments)   Lisinopril    Naproxen    Penicillins    Pioglitazone    Valsartan Other (See Comments)    Current Outpatient Medications  Medication Sig Dispense Refill   aspirin EC 81 MG tablet Take 81 mg by mouth daily.     cilostazol (PLETAL) 100 MG tablet Take 1 tablet  (100 mg total) by mouth daily. 90 tablet 3   cloNIDine (CATAPRES - DOSED IN MG/24 HR) 0.2 mg/24hr patch Place 1 patch (0.2 mg total) onto the skin once a week. 4 patch 0   cycloSPORINE (RESTASIS) 0.05 % ophthalmic emulsion Place 1 drop into both eyes 2 (two) times daily. 0.4 mL 5   diclofenac Sodium (VOLTAREN) 1 % GEL Apply 2 g topically daily.     doxycycline (VIBRA-TABS) 100 MG tablet Take 1 tablet (100 mg total) by mouth 2 (two) times daily. 14 tablet 0   doxycycline (VIBRA-TABS) 100 MG tablet Take 1 tablet (100 mg total) by mouth 2 (two) times daily. 20 tablet 1   epoetin alfa-epbx (RETACRIT) 3000 UNIT/ML injection 3,000 Units every 14 (fourteen) days.     folic acid (FOLVITE) 1 MG tablet Take 1 tablet (1 mg total) by mouth daily. 90 tablet 3   glimepiride (AMARYL) 4 MG tablet Take 4 mg by mouth every morning.     hydrochlorothiazide (HYDRODIURIL) 12.5 MG tablet Take 1 tablet (12.5 mg total) by mouth daily. 30 tablet 4   irbesartan (AVAPRO) 75 MG tablet Take 75 mg by mouth at bedtime.  meclizine (ANTIVERT) 25 MG tablet TAKE 1 TABLET BY MOUTH THREE TIMES DAILY AS NEEDED FOR DIZZINESS 30 tablet 0   memantine (NAMENDA) 10 MG tablet Take 1 tablet (10 mg total) by mouth in the morning and at bedtime. 90 tablet 3   metFORMIN (GLUCOPHAGE) 1000 MG tablet Take 0.5 tablets (500 mg total) by mouth 2 (two) times daily with a meal. Renal dose 180 tablet 1   Omega-3 Fatty Acids (FISH OIL) 1000 MG CAPS Take 1,000 mg by mouth daily.     ondansetron (ZOFRAN) 4 MG tablet Take 1 tablet (4 mg total) by mouth every 6 (six) hours as needed for nausea or vomiting. 20 tablet 0   pantoprazole (PROTONIX) 40 MG tablet Take 1 tablet (40 mg total) by mouth daily. 30 tablet 3   rivastigmine (EXELON) 9.5 mg/24hr APPLY 1 PATCH TOPICALLY ONCE DAILY 30 patch 2   senna (SENOKOT) 8.6 MG tablet Take 1 tablet by mouth daily.     sulfamethoxazole-trimethoprim (BACTRIM DS) 800-160 MG tablet Take 1 tablet by mouth 2 (two) times  daily. 14 tablet 0   Vibegron (GEMTESA) 75 MG TABS Take 75 mg by mouth daily. 28 tablet 0   No current facility-administered medications for this visit.    REVIEW OF SYSTEMS:  '[X]'$  denotes positive finding, '[ ]'$  denotes negative finding Cardiac  Comments:  Chest pain or chest pressure:    Shortness of breath upon exertion:    Short of breath when lying flat:    Irregular heart rhythm:        Vascular    Pain in calf, thigh, or hip brought on by ambulation: x   Pain in feet at night that wakes you up from your sleep:  x   Blood clot in your veins:    Leg swelling:  x         PHYSICAL EXAM: Vitals:   11/07/22 1043  BP: (!) 147/91  Pulse: (!) 55  Temp: (!) 97.3 F (36.3 C)  SpO2: 95%  Weight: 119 lb 3.2 oz (54.1 kg)  Height: '5\' 9"'$  (1.753 m)    GENERAL: The patient is a well-nourished female, in no acute distress. The vital signs are documented above. CARDIOVASCULAR: No palpable popliteal or distal pulses.  She has diminished femoral pulses bilaterally as well. PULMONARY: There is good air exchange  MUSCULOSKELETAL: There are no major deformities or cyanosis. NEUROLOGIC: No focal weakness or paresthesias are detected. SKIN: There are no ulcers or rashes noted.  I have some excoriation of her left great toenail and pendant rubor bilaterally PSYCHIATRIC: The patient has a normal affect.  DATA:  Her ankle arm index is markedly worse since my last study with her in August 2023.  Ankle arm index on the left is found to have an audible flow at the tibial level.  On the right her ankle arm index is 0.44 with dampened monophasic flow  MEDICAL ISSUES: Progression of severe bilateral lower extremity ischemia.  I have recommended urgent arteriography and intervention for limb salvage.  Her most recent creatinine in our system was 1.6 in November 2023.  Explained that we would minimize contrast potentially with CO2 some risk for worsening renal insufficiency.  Explained that there may be  a endovascular option for treatment but also may require surgical intervention.  I did explain that this is limb threatening.    Rosetta Posner, MD FACS [facility] [phone]  Note: Portions of this report may  have been transcribed using voice recognition software.  Every effort has been made to ensure accuracy; however, inadvertent computerized transcription errors may still be present.

## 2022-11-07 NOTE — H&P (View-Only) (Signed)
[facility] [place]  Patient name: Wendy [name] MRN: [id number] DOB: 06/27/1941 Sex: female  REASON FOR VISIT: Worsening ischemia lower extremities  HPI: Wendy [name] is a 81 y.o. female here today for worsening ischemia of her lower extremities.  She is now having rest pain worse in her left than right leg.  I seen her on several occasions in the past.  She has had issues with neuropathy and venous hypertension.  Has worsening pain that is awakening her at night and makes it difficult for her to walk.  Past Medical History:  Diagnosis Date   Acquired hammer toes of both feet    Anemia    Anxiety    Arthritis    Chronic kidney disease    Diabetes mellitus without complication (HCC)    Eczema    Environmental allergies    Fibrocystic breast changes    GERD (gastroesophageal reflux disease)    Hearing loss    Hypercholesteremia    Hypertension    IBS (irritable bowel syndrome)    Incontinence    Memory loss    Osteoarthritis    PVD (peripheral vascular disease) (HCC)    Rheumatoid arthritis ([place])     Family History  Problem Relation Age of Onset   Diabetes Mother    Hypertension Mother    Alzheimer's disease Mother    Heart disease Father    Hypertension Sister    Diabetes Sister    Heart disease Sister    Hypertension Brother    Diabetes Brother    Cancer Sister        lung   Heart disease Sister     SOCIAL HISTORY: Social History   Tobacco Use   Smoking status: Never   Smokeless tobacco: Never  Substance Use Topics   Alcohol use: No    Allergies  Allergen Reactions   Codeine    Erythromycin    Leflunomide Other (See Comments)   Lisinopril    Naproxen    Penicillins    Pioglitazone    Valsartan Other (See Comments)    Current Outpatient Medications  Medication Sig Dispense Refill   aspirin EC 81 MG tablet Take 81 mg by mouth daily.     cilostazol (PLETAL) 100 MG tablet Take 1 tablet  (100 mg total) by mouth daily. 90 tablet 3   cloNIDine (CATAPRES - DOSED IN MG/24 HR) 0.2 mg/24hr patch Place 1 patch (0.2 mg total) onto the skin once a week. 4 patch 0   cycloSPORINE (RESTASIS) 0.05 % ophthalmic emulsion Place 1 drop into both eyes 2 (two) times daily. 0.4 mL 5   diclofenac Sodium (VOLTAREN) 1 % GEL Apply 2 g topically daily.     doxycycline (VIBRA-TABS) 100 MG tablet Take 1 tablet (100 mg total) by mouth 2 (two) times daily. 14 tablet 0   doxycycline (VIBRA-TABS) 100 MG tablet Take 1 tablet (100 mg total) by mouth 2 (two) times daily. 20 tablet 1   epoetin alfa-epbx (RETACRIT) 3000 UNIT/ML injection 3,000 Units every 14 (fourteen) days.     folic acid (FOLVITE) 1 MG tablet Take 1 tablet (1 mg total) by mouth daily. 90 tablet 3   glimepiride (AMARYL) 4 MG tablet Take 4 mg by mouth every morning.     hydrochlorothiazide (HYDRODIURIL) 12.5 MG tablet Take 1 tablet (12.5 mg total) by mouth daily. 30 tablet 4   irbesartan (AVAPRO) 75 MG tablet Take 75 mg by mouth at bedtime.  meclizine (ANTIVERT) 25 MG tablet TAKE 1 TABLET BY MOUTH THREE TIMES DAILY AS NEEDED FOR DIZZINESS 30 tablet 0   memantine (NAMENDA) 10 MG tablet Take 1 tablet (10 mg total) by mouth in the morning and at bedtime. 90 tablet 3   metFORMIN (GLUCOPHAGE) 1000 MG tablet Take 0.5 tablets (500 mg total) by mouth 2 (two) times daily with a meal. Renal dose 180 tablet 1   Omega-3 Fatty Acids (FISH OIL) 1000 MG CAPS Take 1,000 mg by mouth daily.     ondansetron (ZOFRAN) 4 MG tablet Take 1 tablet (4 mg total) by mouth every 6 (six) hours as needed for nausea or vomiting. 20 tablet 0   pantoprazole (PROTONIX) 40 MG tablet Take 1 tablet (40 mg total) by mouth daily. 30 tablet 3   rivastigmine (EXELON) 9.5 mg/24hr APPLY 1 PATCH TOPICALLY ONCE DAILY 30 patch 2   senna (SENOKOT) 8.6 MG tablet Take 1 tablet by mouth daily.     sulfamethoxazole-trimethoprim (BACTRIM DS) 800-160 MG tablet Take 1 tablet by mouth 2 (two) times  daily. 14 tablet 0   Vibegron (GEMTESA) 75 MG TABS Take 75 mg by mouth daily. 28 tablet 0   No current facility-administered medications for this visit.    REVIEW OF SYSTEMS:  '[X]'$  denotes positive finding, '[ ]'$  denotes negative finding Cardiac  Comments:  Chest pain or chest pressure:    Shortness of breath upon exertion:    Short of breath when lying flat:    Irregular heart rhythm:        Vascular    Pain in calf, thigh, or hip brought on by ambulation: x   Pain in feet at night that wakes you up from your sleep:  x   Blood clot in your veins:    Leg swelling:  x         PHYSICAL EXAM: Vitals:   11/07/22 1043  BP: (!) 147/91  Pulse: (!) 55  Temp: (!) 97.3 F (36.3 C)  SpO2: 95%  Weight: 119 lb 3.2 oz (54.1 kg)  Height: '5\' 9"'$  (1.753 m)    GENERAL: The patient is a well-nourished female, in no acute distress. The vital signs are documented above. CARDIOVASCULAR: No palpable popliteal or distal pulses.  She has diminished femoral pulses bilaterally as well. PULMONARY: There is good air exchange  MUSCULOSKELETAL: There are no major deformities or cyanosis. NEUROLOGIC: No focal weakness or paresthesias are detected. SKIN: There are no ulcers or rashes noted.  I have some excoriation of her left great toenail and pendant rubor bilaterally PSYCHIATRIC: The patient has a normal affect.  DATA:  Her ankle arm index is markedly worse since my last study with her in August 2023.  Ankle arm index on the left is found to have an audible flow at the tibial level.  On the right her ankle arm index is 0.44 with dampened monophasic flow  MEDICAL ISSUES: Progression of severe bilateral lower extremity ischemia.  I have recommended urgent arteriography and intervention for limb salvage.  Her most recent creatinine in our system was 1.6 in November 2023.  Explained that we would minimize contrast potentially with CO2 some risk for worsening renal insufficiency.  Explained that there may be  a endovascular option for treatment but also may require surgical intervention.  I did explain that this is limb threatening.    Rosetta Posner, MD FACS [facility] [phone]  Note: Portions of this report may  have been transcribed using voice recognition software.  Every effort has been made to ensure accuracy; however, inadvertent computerized transcription errors may still be present.

## 2022-11-08 ENCOUNTER — Other Ambulatory Visit: Payer: Self-pay

## 2022-11-08 DIAGNOSIS — I70222 Atherosclerosis of native arteries of extremities with rest pain, left leg: Secondary | ICD-10-CM

## 2022-11-12 ENCOUNTER — Other Ambulatory Visit: Payer: Self-pay

## 2022-11-12 ENCOUNTER — Ambulatory Visit (HOSPITAL_BASED_OUTPATIENT_CLINIC_OR_DEPARTMENT_OTHER): Payer: Medicare PPO

## 2022-11-12 ENCOUNTER — Ambulatory Visit (HOSPITAL_COMMUNITY)
Admission: RE | Admit: 2022-11-12 | Discharge: 2022-11-12 | Disposition: A | Discharge status: Home / Self Care | Payer: Medicare PPO | Source: Ambulatory Visit | Attending: Vascular Surgery | Admitting: Vascular Surgery

## 2022-11-12 ENCOUNTER — Ambulatory Visit (HOSPITAL_COMMUNITY)
Admission: RE | Disposition: A | Discharge status: Home / Self Care | Payer: Self-pay | Source: Ambulatory Visit | Attending: Vascular Surgery

## 2022-11-12 DIAGNOSIS — E1140 Type 2 diabetes mellitus with diabetic neuropathy, unspecified: Secondary | ICD-10-CM | POA: Diagnosis not present

## 2022-11-12 DIAGNOSIS — I70222 Atherosclerosis of native arteries of extremities with rest pain, left leg: Secondary | ICD-10-CM

## 2022-11-12 DIAGNOSIS — Z8249 Family history of ischemic heart disease and other diseases of the circulatory system: Secondary | ICD-10-CM | POA: Insufficient documentation

## 2022-11-12 DIAGNOSIS — Z823 Family history of stroke: Secondary | ICD-10-CM | POA: Diagnosis not present

## 2022-11-12 DIAGNOSIS — N189 Chronic kidney disease, unspecified: Secondary | ICD-10-CM | POA: Insufficient documentation

## 2022-11-12 DIAGNOSIS — I70221 Atherosclerosis of native arteries of extremities with rest pain, right leg: Secondary | ICD-10-CM | POA: Diagnosis present

## 2022-11-12 DIAGNOSIS — Z01810 Encounter for preprocedural cardiovascular examination: Secondary | ICD-10-CM | POA: Diagnosis not present

## 2022-11-12 DIAGNOSIS — I998 Other disorder of circulatory system: Secondary | ICD-10-CM | POA: Diagnosis not present

## 2022-11-12 DIAGNOSIS — Z7984 Long term (current) use of oral hypoglycemic drugs: Secondary | ICD-10-CM | POA: Diagnosis not present

## 2022-11-12 DIAGNOSIS — E1122 Type 2 diabetes mellitus with diabetic chronic kidney disease: Secondary | ICD-10-CM | POA: Insufficient documentation

## 2022-11-12 DIAGNOSIS — E1151 Type 2 diabetes mellitus with diabetic peripheral angiopathy without gangrene: Secondary | ICD-10-CM | POA: Insufficient documentation

## 2022-11-12 DIAGNOSIS — I129 Hypertensive chronic kidney disease with stage 1 through stage 4 chronic kidney disease, or unspecified chronic kidney disease: Secondary | ICD-10-CM | POA: Insufficient documentation

## 2022-11-12 HISTORY — PX: ABDOMINAL AORTOGRAM W/LOWER EXTREMITY: CATH118223

## 2022-11-12 LAB — POCT I-STAT, CHEM 8
BUN: 40 mg/dL — ABNORMAL HIGH (ref 8–23)
Calcium, Ion: 1.35 mmol/L (ref 1.15–1.40)
Chloride: 107 mmol/L (ref 98–111)
Creatinine, Ser: 1.7 mg/dL — ABNORMAL HIGH (ref 0.44–1.00)
Glucose, Bld: 143 mg/dL — ABNORMAL HIGH (ref 70–99)
HCT: 30 % — ABNORMAL LOW (ref 36.0–46.0)
Hemoglobin: 10.2 g/dL — ABNORMAL LOW (ref 12.0–15.0)
Potassium: 5.2 mmol/L — ABNORMAL HIGH (ref 3.5–5.1)
Sodium: 141 mmol/L (ref 135–145)
TCO2: 25 mmol/L (ref 22–32)

## 2022-11-12 LAB — GLUCOSE, CAPILLARY: Glucose-Capillary: 130 mg/dL — ABNORMAL HIGH (ref 70–99)

## 2022-11-12 SURGERY — ABDOMINAL AORTOGRAM W/LOWER EXTREMITY
Anesthesia: LOCAL

## 2022-11-12 MED ORDER — FENTANYL CITRATE (PF) 100 MCG/2ML IJ SOLN
INTRAMUSCULAR | Status: AC
  Filled 2022-11-12: qty 2

## 2022-11-12 MED ORDER — HEPARIN (PORCINE) IN NACL 1000-0.9 UT/500ML-% IV SOLN
INTRAVENOUS | Status: DC | PRN
  Administered 2022-11-12 (×2): 500 mL

## 2022-11-12 MED ORDER — SODIUM CHLORIDE 0.9 % IV SOLN: INTRAVENOUS | Status: AC

## 2022-11-12 MED ORDER — MIDAZOLAM HCL 2 MG/2ML IJ SOLN
INTRAMUSCULAR | Status: DC | PRN
  Administered 2022-11-12: .5 mg via INTRAVENOUS

## 2022-11-12 MED ORDER — MIDAZOLAM HCL 2 MG/2ML IJ SOLN
INTRAMUSCULAR | Status: AC
  Filled 2022-11-12: qty 2

## 2022-11-12 MED ORDER — OXYCODONE HCL 5 MG PO TABS
5.0000 mg | ORAL_TABLET | ORAL | Status: DC | PRN
  Administered 2022-11-12: 5 mg via ORAL
  Filled 2022-11-12: qty 1

## 2022-11-12 MED ORDER — LIDOCAINE HCL (PF) 1 % IJ SOLN
INTRAMUSCULAR | Status: DC | PRN
  Administered 2022-11-12: 5 mL via SUBCUTANEOUS

## 2022-11-12 MED ORDER — HYDRALAZINE HCL 20 MG/ML IJ SOLN
INTRAMUSCULAR | Status: AC
  Filled 2022-11-12: qty 1

## 2022-11-12 MED ORDER — HEPARIN (PORCINE) IN NACL 1000-0.9 UT/500ML-% IV SOLN
INTRAVENOUS | Status: AC
  Filled 2022-11-12: qty 1000

## 2022-11-12 MED ORDER — ACETAMINOPHEN 325 MG PO TABS: 650.0000 mg | ORAL_TABLET | ORAL | Status: DC | PRN

## 2022-11-12 MED ORDER — FENTANYL CITRATE (PF) 100 MCG/2ML IJ SOLN
INTRAMUSCULAR | Status: DC | PRN
  Administered 2022-11-12: 50 ug via INTRAVENOUS
  Administered 2022-11-12: 50 ug

## 2022-11-12 MED ORDER — IODIXANOL 320 MG/ML IV SOLN
INTRAVENOUS | Status: DC | PRN
  Administered 2022-11-12: 50 mL via INTRA_ARTERIAL

## 2022-11-12 MED ORDER — SODIUM CHLORIDE 0.9 % IV SOLN: INTRAVENOUS | Status: DC

## 2022-11-12 MED ORDER — ONDANSETRON HCL 4 MG/2ML IJ SOLN: 4.0000 mg | Freq: Four times a day (QID) | INTRAMUSCULAR | Status: DC | PRN

## 2022-11-12 MED ORDER — HYDRALAZINE HCL 20 MG/ML IJ SOLN
5.0000 mg | INTRAMUSCULAR | Status: DC | PRN
  Administered 2022-11-12: 5 mg via INTRAVENOUS

## 2022-11-12 MED ORDER — LABETALOL HCL 5 MG/ML IV SOLN
10.0000 mg | INTRAVENOUS | Status: DC | PRN
  Administered 2022-11-12 (×2): 10 mg via INTRAVENOUS

## 2022-11-12 MED ORDER — MORPHINE SULFATE (PF) 2 MG/ML IV SOLN: 2.0000 mg | INTRAVENOUS | Status: DC | PRN

## 2022-11-12 MED ORDER — LABETALOL HCL 5 MG/ML IV SOLN
INTRAVENOUS | Status: AC
  Filled 2022-11-12: qty 4

## 2022-11-12 SURGICAL SUPPLY — 13 items
CATH OMNI FLUSH 5F 65CM (CATHETERS) IMPLANT
CATH SOFT-VU 4F 65 STRAIGHT (CATHETERS) IMPLANT
CATH SOFT-VU STRAIGHT 4F 65CM (CATHETERS) ×1
DEVICE TORQUE .025-.038 (MISCELLANEOUS) IMPLANT
GUIDEWIRE ANGLED .035X150CM (WIRE) IMPLANT
KIT MICROPUNCTURE NIT STIFF (SHEATH) IMPLANT
KIT PV (KITS) ×1 IMPLANT
SHEATH PINNACLE 5F 10CM (SHEATH) IMPLANT
SHEATH PROBE COVER 6X72 (BAG) IMPLANT
SYR MEDRAD MARK V 150ML (SYRINGE) IMPLANT
TRANSDUCER W/STOPCOCK (MISCELLANEOUS) ×1 IMPLANT
TRAY PV CATH (CUSTOM PROCEDURE TRAY) ×1 IMPLANT
WIRE BENTSON .035X145CM (WIRE) IMPLANT

## 2022-11-12 NOTE — Interval H&P Note (Signed)
History and Physical Interval Note:  11/12/2022 10:03 AM  Wendy [name]  has presented today for surgery, with the diagnosis of critical limb ischemia of left lower extremities.  The various methods of treatment have been discussed with the patient and family. After consideration of risks, benefits and other options for treatment, the patient has consented to  Procedure(s): ABDOMINAL AORTOGRAM W/LOWER EXTREMITY (N/A) as a surgical intervention.  The patient's history has been reviewed, patient examined, no change in status, stable for surgery.  I have reviewed the patient's chart and labs.  Questions were answered to the patient's satisfaction.     Servando Snare

## 2022-11-12 NOTE — Progress Notes (Signed)
Site area: Right groin a 5 french arterial sheath was removed  Site Prior to Removal:  Level 0  Pressure Applied For 20 MINUTES    Bedrest Beginning at 1615p X 4 hours  Manual:   Yes.    Patient Status During Pull:  stable  Post Pull Groin Site:  Level 0  Post Pull Instructions Given:  Yes.    Post Pull Pulses Present:  Yes.    Dressing Applied:  Yes.    Comments:

## 2022-11-12 NOTE — Progress Notes (Signed)
Left lower extremity vein mapping has been completed. Preliminary results can be found in CV Proc through chart review.   11/12/22 5:15 PM Wendy [name] RVT

## 2022-11-12 NOTE — Op Note (Signed)
    Patient name: Wendy [name] MRN: [id number] DOB: [birth year] Sex: female  11/12/2022 Pre-operative Diagnosis: chronic bilateral lower extremity limb threatening ischemia Post-operative diagnosis:  Same Surgeon:  Eda Paschal. Donzetta Matters, MD Procedure Performed: 1.  US guided cannulation of right common femoral artery 2.  CO2 Aortogram and right lower extremity limited angiogram 3.  Selection of left SFA and left lower extremity CO2 and contrasted angiogram 4.  Moderate sedation with fentanyl and Versed for 30 minutes  Indications: 81 year old female with history of peripheral arterial disease now with bilateral lower extremity rest pain left greater than right and wounds of the left foot.  She is now indicated for angiography with possible intervention and her renal function is declined we will use CO2 is much as possible.  Findings: Aorta and iliac segments are free of flow-limiting stenosis.  Left common femoral artery does not appear diseased and there is no gradient across this with pullback.  The left SFA is proximally diseased and then distally frankly occludes for long segment reconstitutes above-knee popliteal artery which appears to be an [place] and then this occludes below the knee and she appears to have peroneal artery runoff only to the level of the ankle.  On the right side again the common femoral arteries without disease in the proximal SFA is patent but then appears to occlude in the mid segment and then with CO2 angiogram appears to have reconstitution of the above-knee popliteal artery and the tibial arteries were not evaluated given the timing.  Patient will be scheduled as an outpatient for left common femoral to peroneal artery bypass, vein mapping to be performed prior to discharge today.   Procedure:  The patient was identified in the holding area and taken to room 8.  The patient was then placed supine on the table and prepped and draped in the usual sterile fashion.  A time out  was called.  Ultrasound was used to evaluate the right common femoral artery which was patent.  The area was anesthetized with 1% lidocaine and cannulated with a micropuncture needle followed by wire and the sheath.  An image was saved the permanent record.  Concomitantly we administered fentanyl and Versed as monitored conscious sedation and her vital signs were monitored by bedside nursing throughout the case.  We then placed a Bentson wire followed by a 5 French sheath and an Omni catheter to the level of L1 and CO2 aortogram was performed.  We then crossed the bifurcation with a straight catheter and Bentson wire and performed limited CO2 angiography of the left lower extremity followed by contrasted angiography which demonstrated the above findings with peroneal runoff only.  With this we performed a pullback pressure gradient with the straight catheter across the left common femoral artery and this demonstrated no gradient.  The catheter was then removed.  Right lower extremity Limited angiography was performed with CO2.  With the above findings the sheath will be pulled in postoperative holding.  She tolerated the procedure well without any complication.   Contrast: 50cc  Calissa Swenor C. Donzetta Matters, MD [facility] [place] Office: [phone] Pager: [phone]

## 2022-11-13 ENCOUNTER — Encounter (HOSPITAL_COMMUNITY): Payer: Self-pay | Admitting: Vascular Surgery

## 2022-11-13 MED FILL — Fentanyl Citrate Preservative Free (PF) Inj 100 MCG/2ML: INTRAMUSCULAR | Qty: 2 | Status: AC

## 2022-11-14 ENCOUNTER — Emergency Department (HOSPITAL_COMMUNITY)
Admission: EM | Admit: 2022-11-14 | Discharge: 2022-11-14 | Disposition: A | Discharge status: Home / Self Care | Payer: Medicare PPO | Attending: Emergency Medicine | Admitting: Emergency Medicine

## 2022-11-14 ENCOUNTER — Encounter (HOSPITAL_COMMUNITY): Payer: Self-pay | Admitting: Vascular Surgery

## 2022-11-14 ENCOUNTER — Other Ambulatory Visit: Payer: Self-pay

## 2022-11-14 DIAGNOSIS — M79604 Pain in right leg: Secondary | ICD-10-CM | POA: Diagnosis present

## 2022-11-14 DIAGNOSIS — Z7982 Long term (current) use of aspirin: Secondary | ICD-10-CM | POA: Diagnosis not present

## 2022-11-14 DIAGNOSIS — M79605 Pain in left leg: Secondary | ICD-10-CM | POA: Diagnosis not present

## 2022-11-14 DIAGNOSIS — E119 Type 2 diabetes mellitus without complications: Secondary | ICD-10-CM | POA: Diagnosis not present

## 2022-11-14 DIAGNOSIS — Z8679 Personal history of other diseases of the circulatory system: Secondary | ICD-10-CM

## 2022-11-14 DIAGNOSIS — G8918 Other acute postprocedural pain: Secondary | ICD-10-CM | POA: Diagnosis not present

## 2022-11-14 DIAGNOSIS — Z7984 Long term (current) use of oral hypoglycemic drugs: Secondary | ICD-10-CM | POA: Insufficient documentation

## 2022-11-14 MED ORDER — HYDROCODONE-ACETAMINOPHEN 5-325 MG PO TABS
2.0000 | ORAL_TABLET | Freq: Once | ORAL | Status: AC
  Administered 2022-11-14: 2 via ORAL
  Filled 2022-11-14: qty 2

## 2022-11-14 MED ORDER — TRAMADOL HCL 50 MG PO TABS: 50.0000 mg | ORAL_TABLET | Freq: Four times a day (QID) | ORAL | 0 refills | Status: DC | PRN

## 2022-11-14 NOTE — ED Triage Notes (Signed)
Pt c/o post op pain in left lower extremity. Pt had an Abdominal Aortogram w/ left lower extremity yesterday, states they said they did not send her home with any medications for pain relief.

## 2022-11-14 NOTE — ED Provider Notes (Addendum)
Wendy [name]   CSN: [id number] Arrival date & time: 11/14/22  0010     History  Chief Complaint  Patient presents with   Post-op Problem    Wendy [name] is a 81 y.o. female.  Patient is an 81 year old female with past medical history of peripheral vascular disease, diabetes, hyperlipidemia, chronic renal insufficiency.  Patient presenting with complaints of bilateral leg pain.  She underwent an arteriogram by Dr. Donzetta Matters yesterday.  When she was discharged, she was not given additional tramadol which she normally takes for her leg pain.  She attempted to call her primary doctor for a refill of this, however was unable to get a hold of them.  This evening, her legs began to ache so bad she felt as though she needed to come to the ER.  She denies to me she is having any pain or bleeding at the puncture site in the groin.  Her pain and issues are all with her lower legs.  The history is provided by the patient.       Home Medications Prior to Admission medications   Medication Sig Start Date End Date Taking? Authorizing Provider  amLODipine (NORVASC) 2.5 MG tablet Take 2.5 mg by mouth 2 (two) times daily.    [provider]  aspirin EC 81 MG tablet Take 81 mg by mouth daily.    [provider]  cilostazol (PLETAL) 100 MG tablet Take 1 tablet (100 mg total) by mouth daily. 04/13/20   Janora Norlander, DO  cloNIDine (CATAPRES - DOSED IN MG/24 HR) 0.2 mg/24hr patch Place 1 patch (0.2 mg total) onto the skin once a week. 09/07/20   Janora Norlander, DO  cloNIDine (CATAPRES - DOSED IN MG/24 HR) 0.3 mg/24hr patch 0.3 mg once a week.    [provider]  cycloSPORINE (RESTASIS) 0.05 % ophthalmic emulsion Place 1 drop into both eyes 2 (two) times daily. 04/13/20   Janora Norlander, DO  diclofenac Sodium (VOLTAREN) 1 % GEL Apply 2 g topically daily.    [provider]  doxycycline (VIBRA-TABS) 100 MG tablet Take 1 tablet  (100 mg total) by mouth 2 (two) times daily. 05/15/22   Trula Slade, DPM  doxycycline (VIBRA-TABS) 100 MG tablet Take 1 tablet (100 mg total) by mouth 2 (two) times daily. 09/19/22   Wallene Huh, DPM  epoetin alfa-epbx (RETACRIT) 3000 UNIT/ML injection 3,000 Units every 14 (fourteen) days.    Bhutani, Manpreet S, MD  folic acid (FOLVITE) 1 MG tablet Take 1 tablet (1 mg total) by mouth daily. 05/06/20   Janora Norlander, DO  furosemide (LASIX) 20 MG tablet SMARTSIG:1 Tablet(s) By Mouth Morning-Evening    [provider]  gabapentin (NEURONTIN) 100 MG capsule TAKE 1 TO 2 CAPSULES BY MOUTH AT BEDTIME AS NEEDED 08/10/22   [provider]  glimepiride (AMARYL) 4 MG tablet Take 4 mg by mouth every morning. 12/01/21   [provider]  hydrochlorothiazide (HYDRODIURIL) 12.5 MG tablet Take 1 tablet (12.5 mg total) by mouth daily. 08/18/20   Janora Norlander, DO  irbesartan (AVAPRO) 75 MG tablet Take 75 mg by mouth at bedtime. 06/07/22   [provider]  meclizine (ANTIVERT) 25 MG tablet TAKE 1 TABLET BY MOUTH THREE TIMES DAILY AS NEEDED FOR DIZZINESS 09/27/20   Ronnie Doss M, DO  memantine (NAMENDA) 10 MG tablet Take 1 tablet (10 mg total) by mouth in the morning and at bedtime. 04/13/20  Ronnie Doss M, DO  metFORMIN (GLUCOPHAGE) 1000 MG tablet Take 0.5 tablets (500 mg total) by mouth 2 (two) times daily with a meal. Renal dose 03/07/20   Ronnie Doss M, DO  metoprolol succinate (TOPROL-XL) 25 MG 24 hr tablet Take 25 mg by mouth daily.    [provider]  mirtazapine (REMERON) 15 MG tablet Take 15 mg by mouth at bedtime.    [provider]  Omega-3 Fatty Acids (FISH OIL) 1000 MG CAPS Take 1,000 mg by mouth daily.    [provider]  ondansetron (ZOFRAN) 4 MG tablet Take 1 tablet (4 mg total) by mouth every 6 (six) hours as needed for nausea or vomiting. 01/06/22   Pollina, Gwenyth Allegra, MD  pantoprazole (PROTONIX) 40 MG  tablet Take 1 tablet (40 mg total) by mouth daily. 01/06/22   Orpah Greek, MD  predniSONE (DELTASONE) 5 MG tablet Take 5-10 mg by mouth daily as needed. 08/31/22   [provider]  rivastigmine (EXELON) 9.5 mg/24hr APPLY 1 PATCH TOPICALLY ONCE DAILY 12/29/20   Ronnie Doss M, DO  senna (SENOKOT) 8.6 MG tablet Take 1 tablet by mouth daily.    [provider]  sulfamethoxazole-trimethoprim (BACTRIM DS) 800-160 MG tablet Take 1 tablet by mouth 2 (two) times daily. 12/25/21   Summerlin, Berneice Heinrich, PA-C  Vibegron (GEMTESA) 75 MG TABS Take 75 mg by mouth daily. 12/21/21   Summerlin, Berneice Heinrich, PA-C      Allergies    Codeine, Erythromycin, Leflunomide, Lisinopril, Naproxen, Penicillins, Pioglitazone, and Valsartan    Review of Systems   Review of Systems  All other systems reviewed and are negative.   Physical Exam Updated Vital Signs BP (!) 141/64   Pulse 78   Temp 98.2 F (36.8 C) (Oral)   Resp 18   Ht '5\' 9"'$  (1.753 m)   Wt 54.4 kg   SpO2 99%   BMI 17.72 kg/m  Physical Exam Vitals and nursing [name] reviewed.  Constitutional:      Appearance: Normal appearance.  HENT:     Head: Normocephalic and atraumatic.  Pulmonary:     Effort: Pulmonary effort is normal.  Musculoskeletal:     Comments: Patient's lower extremities are warm to the touch and not cool or dusky.  I am unable to palpate a definite DP or PT pulse.  Skin:    General: Skin is warm and dry.  Neurological:     Mental Status: She is alert.     ED Results / Procedures / Treatments   Labs (all labs ordered are listed, but only abnormal results are displayed) Labs Reviewed - No data to display  EKG   Radiology VAS [place] LOWER EXTREMITY SAPHENOUS VEIN MAPPING  Result Date: 11/12/2022 [place] Patient Name:  Wendy [name]  Date of Exam:   11/12/2022 Medical Rec #: [id number]      Accession #:    [id number] Date of Birth: [birth year]     Patient Gender: F  Patient Age:   46 years Exam Location:  [facility] Procedure:      VAS [place] LOWER EXTREMITY SAPHENOUS VEIN MAPPING Referring Phys: Servando Snare --------------------------------------------------------------------------------  Indications:       Pre-op Other Indications: PAD Risk Factors:      Hypertension, Diabetes.  Comparison Study: No prior studies. Performing Technologist: Oliver Hum RVT  Examination Guidelines: A complete evaluation includes B-mode imaging, spectral Doppler, color Doppler, and power Doppler as needed of all accessible portions of  each vessel. Bilateral testing is considered an integral part of a complete examination. Limited examinations for reoccurring indications may be performed as noted. +---------------+-----------+----------------------+---------------+-----------+   RT Diameter  RT Findings         GSV            LT Diameter  LT Findings      (cm)                                            (cm)                  +---------------+-----------+----------------------+---------------+-----------+                               Saphenofemoral         0.41                                                   Junction                                  +---------------+-----------+----------------------+---------------+-----------+                               Proximal thigh         0.18                  +---------------+-----------+----------------------+---------------+-----------+                                 Mid thigh            0.14       branching  +---------------+-----------+----------------------+---------------+-----------+                                Distal thigh          0.15                  +---------------+-----------+----------------------+---------------+-----------+                                    Knee              0.11       branching  +---------------+-----------+----------------------+---------------+-----------+                                  Prox calf            0.10                  +---------------+-----------+----------------------+---------------+-----------+                                  Mid calf            0.06  branching  +---------------+-----------+----------------------+---------------+-----------+                                Distal calf           0.08                  +---------------+-----------+----------------------+---------------+-----------+ Diagnosing physician: Orlie Pollen Electronically signed by Orlie Pollen on 11/12/2022 at 5:41:41 PM.    Final    PERIPHERAL VASCULAR CATHETERIZATION  Result Date: 11/12/2022 Images from the original result were not included. Patient name: Wendy [name] MRN: [id number] DOB: 1941/06/22 Sex: female 11/12/2022 Pre-operative Diagnosis: chronic bilateral lower extremity limb threatening ischemia Post-operative diagnosis:  Same Surgeon:  Eda Paschal. Donzetta Matters, MD Procedure Performed: 1.  US guided cannulation of right common femoral artery 2.  CO2 Aortogram and right lower extremity limited angiogram 3.  Selection of left SFA and left lower extremity CO2 and contrasted angiogram 4.  Moderate sedation with fentanyl and Versed for 30 minutes Indications: 81 year old female with history of peripheral arterial disease now with bilateral lower extremity rest pain left greater than right and wounds of the left foot.  She is now indicated for angiography with possible intervention and her renal function is declined we will use CO2 is much as possible. Findings: Aorta and iliac segments are free of flow-limiting stenosis.  Left common femoral artery does not appear diseased and there is no gradient across this with pullback.  The left SFA is proximally diseased and then distally frankly occludes for long segment reconstitutes above-knee popliteal artery which appears to be an [place] and then this occludes below the knee and she appears to have peroneal  artery runoff only to the level of the ankle.  On the right side again the common femoral arteries without disease in the proximal SFA is patent but then appears to occlude in the mid segment and then with CO2 angiogram appears to have reconstitution of the above-knee popliteal artery and the tibial arteries were not evaluated given the timing. Patient will be scheduled as an outpatient for left common femoral to peroneal artery bypass, vein mapping to be performed prior to discharge today.  Procedure:  The patient was identified in the holding area and taken to room 8.  The patient was then placed supine on the table and prepped and draped in the usual sterile fashion.  A time out was called.  Ultrasound was used to evaluate the right common femoral artery which was patent.  The area was anesthetized with 1% lidocaine and cannulated with a micropuncture needle followed by wire and the sheath.  An image was saved the permanent record.  Concomitantly we administered fentanyl and Versed as monitored conscious sedation and her vital signs were monitored by bedside nursing throughout the case.  We then placed a Bentson wire followed by a 5 French sheath and an Omni catheter to the level of L1 and CO2 aortogram was performed.  We then crossed the bifurcation with a straight catheter and Bentson wire and performed limited CO2 angiography of the left lower extremity followed by contrasted angiography which demonstrated the above findings with peroneal runoff only.  With this we performed a pullback pressure gradient with the straight catheter across the left common femoral artery and this demonstrated no gradient.  The catheter was then removed.  Right lower extremity Limited angiography was performed with CO2.  With the above findings the sheath will be pulled in  postoperative holding.  She tolerated the procedure well without any complication. Contrast: 50cc Brandon C. Donzetta Matters, MD [facility]  Office: [phone] Pager: [phone]    Procedures Procedures    Medications Ordered in ED Medications  HYDROcodone-acetaminophen (NORCO/VICODIN) 5-325 MG per tablet 2 tablet (has no administration in time range)    ED Course/ Medical Decision Making/ A&P  Patient presenting with bilateral leg pain status post arteriogram.  She appears to be having no complications at the puncture site, but describes ongoing pain in her legs that has been chronic in nature.  She was not given a refill of her tramadol and this is the reason that she is presenting this evening.  I was unable to palpate a pulse in her feet and the nurse had some difficulty identifying a pulse with our Doppler.  It was my intention to further check pulses with the Doppler myself, however the son became somewhat annoyed and refused to allow me to do this.    Patient to be discharged with tramadol.  She was given a dose of hydrocodone here.  Final Clinical Impression(s) / ED Diagnoses Final diagnoses:  None    Rx / DC Orders ED Discharge Orders     None         Veryl Speak, MD 11/14/22 [id number]    Veryl Speak, MD 11/14/22 [phone]

## 2022-11-14 NOTE — ED Notes (Signed)
EDP made aware of inability to find pedal pulses with doppler

## 2022-11-14 NOTE — Discharge Instructions (Signed)
Begin taking tramadol as prescribed.  Follow-up with your primary doctor/vascular surgeon if you experience additional problems.

## 2022-11-14 NOTE — ED Notes (Signed)
Pt refused for EDP to look for doppler pedal pulses

## 2022-11-16 ENCOUNTER — Other Ambulatory Visit: Payer: Self-pay

## 2022-11-16 DIAGNOSIS — I70222 Atherosclerosis of native arteries of extremities with rest pain, left leg: Secondary | ICD-10-CM

## 2022-11-20 ENCOUNTER — Telehealth: Payer: Self-pay

## 2022-11-20 ENCOUNTER — Encounter (HOSPITAL_COMMUNITY): Payer: Self-pay | Admitting: Emergency Medicine

## 2022-11-20 ENCOUNTER — Inpatient Hospital Stay (HOSPITAL_COMMUNITY)
Admission: EM | Admit: 2022-11-20 | Discharge: 2022-12-04 | DRG: 240 | Disposition: A | Discharge status: Skilled Nursing Facility | Payer: Medicare PPO | Attending: Family Medicine | Admitting: Family Medicine

## 2022-11-20 DIAGNOSIS — R262 Difficulty in walking, not elsewhere classified: Secondary | ICD-10-CM | POA: Diagnosis present

## 2022-11-20 DIAGNOSIS — Z515 Encounter for palliative care: Secondary | ICD-10-CM

## 2022-11-20 DIAGNOSIS — Z79899 Other long term (current) drug therapy: Secondary | ICD-10-CM

## 2022-11-20 DIAGNOSIS — I70262 Atherosclerosis of native arteries of extremities with gangrene, left leg: Principal | ICD-10-CM | POA: Diagnosis present

## 2022-11-20 DIAGNOSIS — H9190 Unspecified hearing loss, unspecified ear: Secondary | ICD-10-CM | POA: Diagnosis present

## 2022-11-20 DIAGNOSIS — E1142 Type 2 diabetes mellitus with diabetic polyneuropathy: Secondary | ICD-10-CM | POA: Diagnosis present

## 2022-11-20 DIAGNOSIS — L97519 Non-pressure chronic ulcer of other part of right foot with unspecified severity: Secondary | ICD-10-CM | POA: Diagnosis present

## 2022-11-20 DIAGNOSIS — L8990 Pressure ulcer of unspecified site, unspecified stage: Secondary | ICD-10-CM | POA: Insufficient documentation

## 2022-11-20 DIAGNOSIS — L03119 Cellulitis of unspecified part of limb: Secondary | ICD-10-CM | POA: Diagnosis not present

## 2022-11-20 DIAGNOSIS — L89322 Pressure ulcer of left buttock, stage 2: Secondary | ICD-10-CM | POA: Diagnosis not present

## 2022-11-20 DIAGNOSIS — M79604 Pain in right leg: Secondary | ICD-10-CM | POA: Diagnosis present

## 2022-11-20 DIAGNOSIS — K219 Gastro-esophageal reflux disease without esophagitis: Secondary | ICD-10-CM | POA: Diagnosis present

## 2022-11-20 DIAGNOSIS — M1990 Unspecified osteoarthritis, unspecified site: Secondary | ICD-10-CM | POA: Diagnosis present

## 2022-11-20 DIAGNOSIS — J9811 Atelectasis: Secondary | ICD-10-CM | POA: Diagnosis not present

## 2022-11-20 DIAGNOSIS — I998 Other disorder of circulatory system: Principal | ICD-10-CM

## 2022-11-20 DIAGNOSIS — Z888 Allergy status to other drugs, medicaments and biological substances status: Secondary | ICD-10-CM

## 2022-11-20 DIAGNOSIS — L03116 Cellulitis of left lower limb: Secondary | ICD-10-CM | POA: Diagnosis present

## 2022-11-20 DIAGNOSIS — D72828 Other elevated white blood cell count: Secondary | ICD-10-CM | POA: Diagnosis not present

## 2022-11-20 DIAGNOSIS — I70223 Atherosclerosis of native arteries of extremities with rest pain, bilateral legs: Secondary | ICD-10-CM | POA: Insufficient documentation

## 2022-11-20 DIAGNOSIS — Z91199 Patient's noncompliance with other medical treatment and regimen due to unspecified reason: Secondary | ICD-10-CM

## 2022-11-20 DIAGNOSIS — T82392A Other mechanical complication of femoral arterial graft (bypass), initial encounter: Secondary | ICD-10-CM | POA: Diagnosis not present

## 2022-11-20 DIAGNOSIS — F0390 Unspecified dementia without behavioral disturbance: Secondary | ICD-10-CM | POA: Diagnosis present

## 2022-11-20 DIAGNOSIS — Z833 Family history of diabetes mellitus: Secondary | ICD-10-CM

## 2022-11-20 DIAGNOSIS — E11649 Type 2 diabetes mellitus with hypoglycemia without coma: Secondary | ICD-10-CM | POA: Diagnosis not present

## 2022-11-20 DIAGNOSIS — R54 Age-related physical debility: Secondary | ICD-10-CM | POA: Diagnosis present

## 2022-11-20 DIAGNOSIS — Z8249 Family history of ischemic heart disease and other diseases of the circulatory system: Secondary | ICD-10-CM

## 2022-11-20 DIAGNOSIS — L97528 Non-pressure chronic ulcer of other part of left foot with other specified severity: Secondary | ICD-10-CM | POA: Diagnosis present

## 2022-11-20 DIAGNOSIS — R5381 Other malaise: Secondary | ICD-10-CM

## 2022-11-20 DIAGNOSIS — Z820 Family history of epilepsy and other diseases of the nervous system: Secondary | ICD-10-CM

## 2022-11-20 DIAGNOSIS — Z880 Allergy status to penicillin: Secondary | ICD-10-CM

## 2022-11-20 DIAGNOSIS — M069 Rheumatoid arthritis, unspecified: Secondary | ICD-10-CM | POA: Diagnosis present

## 2022-11-20 DIAGNOSIS — R509 Fever, unspecified: Secondary | ICD-10-CM

## 2022-11-20 DIAGNOSIS — I129 Hypertensive chronic kidney disease with stage 1 through stage 4 chronic kidney disease, or unspecified chronic kidney disease: Secondary | ICD-10-CM | POA: Diagnosis present

## 2022-11-20 DIAGNOSIS — E1152 Type 2 diabetes mellitus with diabetic peripheral angiopathy with gangrene: Secondary | ICD-10-CM | POA: Diagnosis present

## 2022-11-20 DIAGNOSIS — E875 Hyperkalemia: Secondary | ICD-10-CM | POA: Diagnosis present

## 2022-11-20 DIAGNOSIS — Z885 Allergy status to narcotic agent status: Secondary | ICD-10-CM

## 2022-11-20 DIAGNOSIS — D631 Anemia in chronic kidney disease: Secondary | ICD-10-CM | POA: Diagnosis present

## 2022-11-20 DIAGNOSIS — R64 Cachexia: Secondary | ICD-10-CM | POA: Diagnosis present

## 2022-11-20 DIAGNOSIS — M3500 Sicca syndrome, unspecified: Secondary | ICD-10-CM | POA: Diagnosis present

## 2022-11-20 DIAGNOSIS — I999 Unspecified disorder of circulatory system: Secondary | ICD-10-CM | POA: Diagnosis present

## 2022-11-20 DIAGNOSIS — I70201 Unspecified atherosclerosis of native arteries of extremities, right leg: Secondary | ICD-10-CM | POA: Diagnosis present

## 2022-11-20 DIAGNOSIS — E1165 Type 2 diabetes mellitus with hyperglycemia: Secondary | ICD-10-CM | POA: Diagnosis present

## 2022-11-20 DIAGNOSIS — Z90710 Acquired absence of both cervix and uterus: Secondary | ICD-10-CM

## 2022-11-20 DIAGNOSIS — N1832 Chronic kidney disease, stage 3b: Secondary | ICD-10-CM | POA: Diagnosis present

## 2022-11-20 DIAGNOSIS — D62 Acute posthemorrhagic anemia: Secondary | ICD-10-CM | POA: Diagnosis not present

## 2022-11-20 DIAGNOSIS — E1129 Type 2 diabetes mellitus with other diabetic kidney complication: Secondary | ICD-10-CM | POA: Diagnosis present

## 2022-11-20 DIAGNOSIS — Y831 Surgical operation with implant of artificial internal device as the cause of abnormal reaction of the patient, or of later complication, without mention of misadventure at the time of the procedure: Secondary | ICD-10-CM | POA: Diagnosis not present

## 2022-11-20 DIAGNOSIS — I10 Essential (primary) hypertension: Secondary | ICD-10-CM | POA: Diagnosis present

## 2022-11-20 DIAGNOSIS — E1122 Type 2 diabetes mellitus with diabetic chronic kidney disease: Secondary | ICD-10-CM | POA: Diagnosis present

## 2022-11-20 DIAGNOSIS — M79671 Pain in right foot: Secondary | ICD-10-CM | POA: Diagnosis present

## 2022-11-20 DIAGNOSIS — Z89612 Acquired absence of left leg above knee: Secondary | ICD-10-CM

## 2022-11-20 DIAGNOSIS — E7800 Pure hypercholesterolemia, unspecified: Secondary | ICD-10-CM | POA: Diagnosis present

## 2022-11-20 DIAGNOSIS — Z881 Allergy status to other antibiotic agents status: Secondary | ICD-10-CM

## 2022-11-20 DIAGNOSIS — T383X6A Underdosing of insulin and oral hypoglycemic [antidiabetic] drugs, initial encounter: Secondary | ICD-10-CM | POA: Diagnosis present

## 2022-11-20 LAB — CBC WITH DIFFERENTIAL/PLATELET
Abs Immature Granulocytes: 0.13 10*3/uL — ABNORMAL HIGH (ref 0.00–0.07)
Basophils Absolute: 0 10*3/uL (ref 0.0–0.1)
Basophils Relative: 0 %
Eosinophils Absolute: 0 10*3/uL (ref 0.0–0.5)
Eosinophils Relative: 0 %
HCT: 31.8 % — ABNORMAL LOW (ref 36.0–46.0)
Hemoglobin: 9.2 g/dL — ABNORMAL LOW (ref 12.0–15.0)
Immature Granulocytes: 1 %
Lymphocytes Relative: 11 %
Lymphs Abs: 1.4 10*3/uL (ref 0.7–4.0)
MCH: 23.1 pg — ABNORMAL LOW (ref 26.0–34.0)
MCHC: 28.9 g/dL — ABNORMAL LOW (ref 30.0–36.0)
MCV: 79.7 fL — ABNORMAL LOW (ref 80.0–100.0)
Monocytes Absolute: 1.2 10*3/uL — ABNORMAL HIGH (ref 0.1–1.0)
Monocytes Relative: 9 %
Neutro Abs: 10.2 10*3/uL — ABNORMAL HIGH (ref 1.7–7.7)
Neutrophils Relative %: 79 %
Platelets: 328 10*3/uL (ref 150–400)
RBC: 3.99 MIL/uL (ref 3.87–5.11)
RDW: 14.9 % (ref 11.5–15.5)
WBC: 13 10*3/uL — ABNORMAL HIGH (ref 4.0–10.5)
nRBC: 0 % (ref 0.0–0.2)

## 2022-11-20 LAB — COMPREHENSIVE METABOLIC PANEL
ALT: 12 U/L (ref 0–44)
AST: 23 U/L (ref 15–41)
Albumin: 2.8 g/dL — ABNORMAL LOW (ref 3.5–5.0)
Alkaline Phosphatase: 42 U/L (ref 38–126)
Anion gap: 9 (ref 5–15)
BUN: 20 mg/dL (ref 8–23)
CO2: 20 mmol/L — ABNORMAL LOW (ref 22–32)
Calcium: 9.2 mg/dL (ref 8.9–10.3)
Chloride: 109 mmol/L (ref 98–111)
Creatinine, Ser: 1.76 mg/dL — ABNORMAL HIGH (ref 0.44–1.00)
GFR, Estimated: 29 mL/min — ABNORMAL LOW (ref >60)
Glucose, Bld: 227 mg/dL — ABNORMAL HIGH (ref 70–99)
Potassium: 5.5 mmol/L — ABNORMAL HIGH (ref 3.5–5.1)
Sodium: 138 mmol/L (ref 135–145)
Total Bilirubin: 0.5 mg/dL (ref 0.3–1.2)
Total Protein: 7.5 g/dL (ref 6.5–8.1)

## 2022-11-20 NOTE — ED Provider Triage Note (Signed)
Emergency Medicine Provider Triage Evaluation Note  Wendy [name] , a 81 y.o. female  was evaluated in triage.  Pt complains of bilateral lower leg pain. Patient had recent arteriogram and has had continued pain since the procedure. No pain reported at access site. Ulcerations noted to left lower leg. Color change noted with bilateral toes. Pain seems to be consistent with reported pain pre-procedure. Patient's son reports speaking with vascular surgery who reportedly stated they would come see her in the emergency department  Review of Systems  Positive: As above Negative: As above  Physical Exam  There were no vitals taken for this visit. Gen:   Awake, no distress   Resp:  Normal effort  MSK:   Moves extremities without difficulty  Other:  Unable to palpate pedal pulses, able to find pulses with doppler bilaterally  Medical Decision Making  Medically screening exam initiated at 4:39 PM.  Appropriate orders placed.  Wendy [name] was informed that the remainder of the evaluation will be completed by another provider, this initial triage assessment does not replace that evaluation, and the importance of remaining in the ED until their evaluation is complete.     Dorothyann Peng, PA-C 11/20/22 1641

## 2022-11-20 NOTE — ED Triage Notes (Signed)
Pt reports recent procedure about 2 weeks ago for her vascular disease. Now pt is unable to walk due to weakness and pain to both feet when touching. Unable to palpate pulses. Ulcers to bilateral feet. Pt has scheduled surgery with vascular in January.

## 2022-11-20 NOTE — Telephone Encounter (Signed)
Pt's son, [name], called requesting a return call for help.  Reviewed pt's chart, returned call for clarification, two identifiers used. Pt's son is unable to care for her at home. Pt is in severe pain that has worsened since the aortogram on 12/18 and is unable to walk. He states that the pain is not post-op pain, but the underlying condition. He took pt to [facility] ED on 12/20 for pain medication. He was very argumentative and was not listening. He refused to do anything other than take her back to the hospital. Instructed him to take her to [facility] ED as someone from [facility] would be on-call there. Informed him that the PA on-call would be paged, but he would still have to go through the ED and once triaged, the ED provider would make the determination about POC. Confirmed understanding.

## 2022-11-21 ENCOUNTER — Other Ambulatory Visit: Payer: Self-pay

## 2022-11-21 ENCOUNTER — Emergency Department (HOSPITAL_COMMUNITY): Payer: Medicare PPO

## 2022-11-21 DIAGNOSIS — Z7189 Other specified counseling: Secondary | ICD-10-CM | POA: Diagnosis not present

## 2022-11-21 DIAGNOSIS — I999 Unspecified disorder of circulatory system: Secondary | ICD-10-CM | POA: Diagnosis not present

## 2022-11-21 DIAGNOSIS — E1122 Type 2 diabetes mellitus with diabetic chronic kidney disease: Secondary | ICD-10-CM | POA: Diagnosis present

## 2022-11-21 DIAGNOSIS — L97528 Non-pressure chronic ulcer of other part of left foot with other specified severity: Secondary | ICD-10-CM | POA: Diagnosis present

## 2022-11-21 DIAGNOSIS — E1142 Type 2 diabetes mellitus with diabetic polyneuropathy: Secondary | ICD-10-CM | POA: Diagnosis present

## 2022-11-21 DIAGNOSIS — F0390 Unspecified dementia without behavioral disturbance: Secondary | ICD-10-CM | POA: Diagnosis present

## 2022-11-21 DIAGNOSIS — R5381 Other malaise: Secondary | ICD-10-CM | POA: Diagnosis not present

## 2022-11-21 DIAGNOSIS — E1152 Type 2 diabetes mellitus with diabetic peripheral angiopathy with gangrene: Secondary | ICD-10-CM | POA: Diagnosis present

## 2022-11-21 DIAGNOSIS — J9811 Atelectasis: Secondary | ICD-10-CM | POA: Diagnosis not present

## 2022-11-21 DIAGNOSIS — D62 Acute posthemorrhagic anemia: Secondary | ICD-10-CM | POA: Diagnosis not present

## 2022-11-21 DIAGNOSIS — E7800 Pure hypercholesterolemia, unspecified: Secondary | ICD-10-CM | POA: Diagnosis present

## 2022-11-21 DIAGNOSIS — M3500 Sicca syndrome, unspecified: Secondary | ICD-10-CM | POA: Diagnosis present

## 2022-11-21 DIAGNOSIS — T82392A Other mechanical complication of femoral arterial graft (bypass), initial encounter: Secondary | ICD-10-CM | POA: Diagnosis not present

## 2022-11-21 DIAGNOSIS — T82868A Thrombosis of vascular prosthetic devices, implants and grafts, initial encounter: Secondary | ICD-10-CM | POA: Diagnosis not present

## 2022-11-21 DIAGNOSIS — D649 Anemia, unspecified: Secondary | ICD-10-CM | POA: Diagnosis not present

## 2022-11-21 DIAGNOSIS — L8990 Pressure ulcer of unspecified site, unspecified stage: Secondary | ICD-10-CM | POA: Diagnosis not present

## 2022-11-21 DIAGNOSIS — D631 Anemia in chronic kidney disease: Secondary | ICD-10-CM | POA: Diagnosis present

## 2022-11-21 DIAGNOSIS — E1151 Type 2 diabetes mellitus with diabetic peripheral angiopathy without gangrene: Secondary | ICD-10-CM | POA: Diagnosis not present

## 2022-11-21 DIAGNOSIS — M79605 Pain in left leg: Secondary | ICD-10-CM | POA: Diagnosis not present

## 2022-11-21 DIAGNOSIS — Y831 Surgical operation with implant of artificial internal device as the cause of abnormal reaction of the patient, or of later complication, without mention of misadventure at the time of the procedure: Secondary | ICD-10-CM | POA: Diagnosis not present

## 2022-11-21 DIAGNOSIS — L89322 Pressure ulcer of left buttock, stage 2: Secondary | ICD-10-CM | POA: Diagnosis not present

## 2022-11-21 DIAGNOSIS — I998 Other disorder of circulatory system: Secondary | ICD-10-CM | POA: Diagnosis not present

## 2022-11-21 DIAGNOSIS — I70235 Atherosclerosis of native arteries of right leg with ulceration of other part of foot: Secondary | ICD-10-CM | POA: Diagnosis not present

## 2022-11-21 DIAGNOSIS — Z89612 Acquired absence of left leg above knee: Secondary | ICD-10-CM | POA: Diagnosis not present

## 2022-11-21 DIAGNOSIS — I70223 Atherosclerosis of native arteries of extremities with rest pain, bilateral legs: Secondary | ICD-10-CM | POA: Diagnosis not present

## 2022-11-21 DIAGNOSIS — I129 Hypertensive chronic kidney disease with stage 1 through stage 4 chronic kidney disease, or unspecified chronic kidney disease: Secondary | ICD-10-CM | POA: Diagnosis present

## 2022-11-21 DIAGNOSIS — M069 Rheumatoid arthritis, unspecified: Secondary | ICD-10-CM | POA: Diagnosis present

## 2022-11-21 DIAGNOSIS — E1165 Type 2 diabetes mellitus with hyperglycemia: Secondary | ICD-10-CM | POA: Diagnosis present

## 2022-11-21 DIAGNOSIS — N1832 Chronic kidney disease, stage 3b: Secondary | ICD-10-CM | POA: Diagnosis present

## 2022-11-21 DIAGNOSIS — E11621 Type 2 diabetes mellitus with foot ulcer: Secondary | ICD-10-CM | POA: Diagnosis not present

## 2022-11-21 DIAGNOSIS — R64 Cachexia: Secondary | ICD-10-CM | POA: Diagnosis present

## 2022-11-21 DIAGNOSIS — I70222 Atherosclerosis of native arteries of extremities with rest pain, left leg: Secondary | ICD-10-CM | POA: Diagnosis not present

## 2022-11-21 DIAGNOSIS — L03116 Cellulitis of left lower limb: Secondary | ICD-10-CM | POA: Diagnosis present

## 2022-11-21 DIAGNOSIS — E11649 Type 2 diabetes mellitus with hypoglycemia without coma: Secondary | ICD-10-CM | POA: Diagnosis not present

## 2022-11-21 DIAGNOSIS — Z515 Encounter for palliative care: Secondary | ICD-10-CM | POA: Diagnosis not present

## 2022-11-21 DIAGNOSIS — E875 Hyperkalemia: Secondary | ICD-10-CM | POA: Diagnosis present

## 2022-11-21 DIAGNOSIS — R4189 Other symptoms and signs involving cognitive functions and awareness: Secondary | ICD-10-CM | POA: Diagnosis not present

## 2022-11-21 DIAGNOSIS — L03119 Cellulitis of unspecified part of limb: Secondary | ICD-10-CM | POA: Diagnosis present

## 2022-11-21 DIAGNOSIS — Z7984 Long term (current) use of oral hypoglycemic drugs: Secondary | ICD-10-CM | POA: Diagnosis not present

## 2022-11-21 DIAGNOSIS — I10 Essential (primary) hypertension: Secondary | ICD-10-CM | POA: Diagnosis not present

## 2022-11-21 DIAGNOSIS — I70262 Atherosclerosis of native arteries of extremities with gangrene, left leg: Secondary | ICD-10-CM | POA: Diagnosis present

## 2022-11-21 DIAGNOSIS — I70201 Unspecified atherosclerosis of native arteries of extremities, right leg: Secondary | ICD-10-CM | POA: Diagnosis present

## 2022-11-21 DIAGNOSIS — N181 Chronic kidney disease, stage 1: Secondary | ICD-10-CM | POA: Diagnosis not present

## 2022-11-21 DIAGNOSIS — L97429 Non-pressure chronic ulcer of left heel and midfoot with unspecified severity: Secondary | ICD-10-CM | POA: Diagnosis not present

## 2022-11-21 LAB — POTASSIUM: Potassium: 4.8 mmol/L (ref 3.5–5.1)

## 2022-11-21 LAB — CBG MONITORING, ED
Glucose-Capillary: 131 mg/dL — ABNORMAL HIGH (ref 70–99)
Glucose-Capillary: 178 mg/dL — ABNORMAL HIGH (ref 70–99)

## 2022-11-21 LAB — GLUCOSE, CAPILLARY: Glucose-Capillary: 177 mg/dL — ABNORMAL HIGH (ref 70–99)

## 2022-11-21 MED ORDER — HEPARIN SODIUM (PORCINE) 5000 UNIT/ML IJ SOLN
5000.0000 [IU] | Freq: Three times a day (TID) | INTRAMUSCULAR | Status: DC
  Administered 2022-11-21: 5000 [IU] via SUBCUTANEOUS
  Filled 2022-11-21: qty 1

## 2022-11-21 MED ORDER — INSULIN ASPART 100 UNIT/ML IJ SOLN
0.0000 [IU] | Freq: Three times a day (TID) | INTRAMUSCULAR | Status: DC
  Administered 2022-11-21: 2 [IU] via SUBCUTANEOUS
  Administered 2022-11-22: 3 [IU] via SUBCUTANEOUS

## 2022-11-21 MED ORDER — FENTANYL CITRATE PF 50 MCG/ML IJ SOSY
12.5000 ug | PREFILLED_SYRINGE | Freq: Once | INTRAMUSCULAR | Status: AC
  Administered 2022-11-21: 12.5 ug via INTRAVENOUS
  Filled 2022-11-21: qty 1

## 2022-11-21 MED ORDER — OXYCODONE HCL 5 MG PO TABS
2.5000 mg | ORAL_TABLET | ORAL | Status: DC | PRN
  Administered 2022-11-21 – 2022-11-22 (×3): 5 mg via ORAL
  Filled 2022-11-21 (×3): qty 1

## 2022-11-21 MED ORDER — SODIUM CHLORIDE 0.9 % IV SOLN
2.0000 g | INTRAVENOUS | Status: DC
  Administered 2022-11-21 – 2022-11-24 (×4): 2 g via INTRAVENOUS
  Filled 2022-11-21 (×4): qty 12.5

## 2022-11-21 MED ORDER — SENNOSIDES-DOCUSATE SODIUM 8.6-50 MG PO TABS
1.0000 | ORAL_TABLET | Freq: Every day | ORAL | Status: DC
  Administered 2022-11-21 – 2022-11-29 (×4): 1 via ORAL
  Filled 2022-11-21 (×4): qty 1

## 2022-11-21 MED ORDER — VANCOMYCIN HCL IN DEXTROSE 1-5 GM/200ML-% IV SOLN
1000.0000 mg | Freq: Once | INTRAVENOUS | Status: AC
  Administered 2022-11-21: 1000 mg via INTRAVENOUS
  Filled 2022-11-21: qty 200

## 2022-11-21 MED ORDER — VANCOMYCIN HCL 750 MG/150ML IV SOLN
750.0000 mg | INTRAVENOUS | Status: DC
  Administered 2022-11-23: 750 mg via INTRAVENOUS
  Filled 2022-11-21: qty 150

## 2022-11-21 MED ORDER — HEPARIN BOLUS VIA INFUSION
3200.0000 [IU] | Freq: Once | INTRAVENOUS | Status: AC
  Administered 2022-11-21: 3200 [IU] via INTRAVENOUS
  Filled 2022-11-21: qty 3200

## 2022-11-21 MED ORDER — HEPARIN (PORCINE) 25000 UT/250ML-% IV SOLN
850.0000 [IU]/h | INTRAVENOUS | Status: DC
  Administered 2022-11-21: 850 [IU]/h via INTRAVENOUS
  Filled 2022-11-21: qty 250

## 2022-11-21 MED ORDER — MEMANTINE HCL 10 MG PO TABS
10.0000 mg | ORAL_TABLET | Freq: Every day | ORAL | Status: DC
  Administered 2022-11-21 – 2022-12-04 (×14): 10 mg via ORAL
  Filled 2022-11-21 (×14): qty 1

## 2022-11-21 MED ORDER — CLINDAMYCIN PHOSPHATE 600 MG/50ML IV SOLN: 600.0000 mg | Freq: Once | INTRAVENOUS | Status: DC

## 2022-11-21 MED ORDER — IRBESARTAN 75 MG PO TABS: 75.0000 mg | ORAL_TABLET | Freq: Every day | ORAL | Status: DC

## 2022-11-21 MED ORDER — ACETAMINOPHEN 325 MG PO TABS
650.0000 mg | ORAL_TABLET | Freq: Four times a day (QID) | ORAL | Status: DC
  Administered 2022-11-21 – 2022-11-22 (×4): 650 mg via ORAL
  Filled 2022-11-21 (×5): qty 2

## 2022-11-21 MED ORDER — METOPROLOL SUCCINATE ER 25 MG PO TB24
25.0000 mg | ORAL_TABLET | Freq: Every day | ORAL | Status: DC
  Administered 2022-11-22 – 2022-12-04 (×12): 25 mg via ORAL
  Filled 2022-11-21 (×13): qty 1

## 2022-11-21 MED ORDER — CEFAZOLIN SODIUM-DEXTROSE 2-4 GM/100ML-% IV SOLN
2.0000 g | Freq: Once | INTRAVENOUS | Status: AC
  Administered 2022-11-21: 2 g via INTRAVENOUS
  Filled 2022-11-21: qty 100

## 2022-11-21 MED ORDER — FOLIC ACID 1 MG PO TABS
1.0000 mg | ORAL_TABLET | Freq: Every day | ORAL | Status: DC
  Administered 2022-11-21 – 2022-12-04 (×13): 1 mg via ORAL
  Filled 2022-11-21 (×13): qty 1

## 2022-11-21 MED ORDER — POLYETHYLENE GLYCOL 3350 17 G PO PACK
17.0000 g | PACK | Freq: Every day | ORAL | Status: DC
  Administered 2022-11-21 – 2022-11-29 (×5): 17 g via ORAL
  Filled 2022-11-21 (×5): qty 1

## 2022-11-21 NOTE — ED Provider Notes (Signed)
[facility] DEPARTMENT Provider Note   CSN: [id number] Arrival date & time: 11/20/22  1530     History  Chief Complaint  Patient presents with   Circulatory Problem    Wendy [name] is a 81 y.o. female.  HPI   81 year old female history of type 2 diabetes, essential hypertension, peripheral vascular disease, CKD stage III, presents today complaining of increased pain of bilateral lower extremities.  She has worsening wounds on the left foot.  She reports that she recently had a procedure done by Dr. Donzetta Matters and is scheduled to have surgery.  She has had worsening pain and is unable to walk.  This is occurred over the past week.  She denies any fever or chills.  There are some redness increasing of the foot from the left wound.  Home Medications Prior to Admission medications   Medication Sig Start Date End Date Taking? Authorizing Provider  amLODipine (NORVASC) 2.5 MG tablet Take 2.5 mg by mouth 2 (two) times daily.    [provider]  aspirin EC 81 MG tablet Take 81 mg by mouth daily.    [provider]  cilostazol (PLETAL) 100 MG tablet Take 1 tablet (100 mg total) by mouth daily. 04/13/20   Janora Norlander, DO  cloNIDine (CATAPRES - DOSED IN MG/24 HR) 0.2 mg/24hr patch Place 1 patch (0.2 mg total) onto the skin once a week. 09/07/20   Janora Norlander, DO  cloNIDine (CATAPRES - DOSED IN MG/24 HR) 0.3 mg/24hr patch 0.3 mg once a week.    [provider]  cycloSPORINE (RESTASIS) 0.05 % ophthalmic emulsion Place 1 drop into both eyes 2 (two) times daily. 04/13/20   Janora Norlander, DO  diclofenac Sodium (VOLTAREN) 1 % GEL Apply 2 g topically daily.    [provider]  doxycycline (VIBRA-TABS) 100 MG tablet Take 1 tablet (100 mg total) by mouth 2 (two) times daily. 05/15/22   Trula Slade, DPM  doxycycline (VIBRA-TABS) 100 MG tablet Take 1 tablet (100 mg total) by mouth 2 (two) times daily. 09/19/22   Wallene Huh, DPM  epoetin alfa-epbx (RETACRIT) 3000 UNIT/ML injection 3,000 Units every 14 (fourteen) days.    Bhutani, Manpreet S, MD  folic acid (FOLVITE) 1 MG tablet Take 1 tablet (1 mg total) by mouth daily. 05/06/20   Janora Norlander, DO  furosemide (LASIX) 20 MG tablet Take 20 mg by mouth every evening.    [provider]  glimepiride (AMARYL) 4 MG tablet Take 4 mg by mouth every morning. 12/01/21   [provider]  hydrochlorothiazide (HYDRODIURIL) 12.5 MG tablet Take 1 tablet (12.5 mg total) by mouth daily. 08/18/20   Janora Norlander, DO  irbesartan (AVAPRO) 75 MG tablet Take 75 mg by mouth at bedtime. 06/07/22   [provider]  meclizine (ANTIVERT) 25 MG tablet TAKE 1 TABLET BY MOUTH THREE TIMES DAILY AS NEEDED FOR DIZZINESS Patient taking differently: Take 25 mg by mouth 3 (three) times daily as needed for dizziness. 09/27/20   Janora Norlander, DO  memantine (NAMENDA) 10 MG tablet Take 1 tablet (10 mg total) by mouth in the morning and at bedtime. 04/13/20   Janora Norlander, DO  metFORMIN (GLUCOPHAGE) 1000 MG tablet Take 0.5 tablets (500 mg total) by mouth 2 (two) times daily with a meal. Renal dose 03/07/20   Ronnie Doss M, DO  metoprolol succinate (TOPROL-XL) 25 MG 24 hr tablet Take 25 mg by mouth  daily.    [provider]  mirtazapine (REMERON) 15 MG tablet Take 15 mg by mouth at bedtime.    [provider]  ondansetron (ZOFRAN) 4 MG tablet Take 1 tablet (4 mg total) by mouth every 6 (six) hours as needed for nausea or vomiting. 01/06/22   Pollina, Gwenyth Allegra, MD  pantoprazole (PROTONIX) 40 MG tablet Take 1 tablet (40 mg total) by mouth daily. 01/06/22   Orpah Greek, MD  rivastigmine (EXELON) 9.5 mg/24hr APPLY 1 PATCH TOPICALLY ONCE DAILY Patient taking differently: Place 9.5 mg onto the skin See admin instructions. APPLY 1 PATCH TOPICALLY ONCE DAILY 12/29/20   Ronnie Doss M, DO  traMADol (ULTRAM) 50 MG tablet Take  1 tablet (50 mg total) by mouth every 6 (six) hours as needed. Patient taking differently: Take 50 mg by mouth every 6 (six) hours as needed for moderate pain. 11/14/22   Veryl Speak, MD  Vibegron (GEMTESA) 75 MG TABS Take 75 mg by mouth daily. 12/21/21   Summerlin, Berneice Heinrich, PA-C      Allergies    Codeine, Erythromycin, Leflunomide, Lisinopril, Naproxen, Penicillins, Pioglitazone, and Valsartan    Review of Systems   Review of Systems  Physical Exam Updated Vital Signs BP (!) 160/69   Pulse 63   Temp 98.4 F (36.9 C) (Oral)   Resp 15   SpO2 99%  Physical Exam Vitals and nursing note reviewed.  Constitutional:      Appearance: Normal appearance.  HENT:     Head: Normocephalic.     Right Ear: External ear normal.     Nose: Nose normal.     Mouth/Throat:     Pharynx: Oropharynx is clear.  Eyes:     Pupils: Pupils are equal, round, and reactive to light.  Cardiovascular:     Rate and Rhythm: Normal rate.  Pulmonary:     Effort: Pulmonary effort is normal.     Breath sounds: Normal breath sounds.  Abdominal:     General: Abdomen is flat. Bowel sounds are normal.  Musculoskeletal:        General: Normal range of motion.     Cervical back: Normal range of motion.     Comments: Left foot with wounds to toes and erythema proximally up the leg Right foot without wounds physical touch Dp is dopplerable on left foot First dopplerable pulse on right lower extremity is popliteal  Skin:    General: Skin is warm.     Capillary Refill: Capillary refill takes less than 2 seconds.     Findings: Lesion present.  Neurological:     General: No focal deficit present.     Mental Status: She is alert.  Psychiatric:        Mood and Affect: Mood normal.     ED Results / Procedures / Treatments   Labs (all labs ordered are listed, but only abnormal results are displayed) Labs Reviewed  COMPREHENSIVE METABOLIC PANEL - Abnormal; Notable for the following components:       Result Value   Potassium 5.5 (*)    CO2 20 (*)    Glucose, Bld 227 (*)    Creatinine, Ser 1.76 (*)    Albumin 2.8 (*)    GFR, Estimated 29 (*)    All other components within normal limits  CBC WITH DIFFERENTIAL/PLATELET - Abnormal; Notable for the following components:   WBC 13.0 (*)    Hemoglobin 9.2 (*)    HCT 31.8 (*)    MCV  79.7 (*)    MCH 23.1 (*)    MCHC 28.9 (*)    Neutro Abs 10.2 (*)    Monocytes Absolute 1.2 (*)    Abs Immature Granulocytes 0.13 (*)    All other components within normal limits  CBG MONITORING, ED - Abnormal; Notable for the following components:   Glucose-Capillary 131 (*)    All other components within normal limits  POTASSIUM    EKG EKG Interpretation  Date/Time:  Tuesday November 20 2022 16:27:34 EST Ventricular Rate:  69 PR Interval:  146 QRS Duration: 122 QT Interval:  452 QTC Calculation: 484 R Axis:   -49 Text Interpretation: Normal sinus rhythm with sinus arrhythmia Right bundle branch block Left anterior fascicular blockBifascicular block Septal infarct , age undetermined Abnormal ECG When compared with ECG of 14-Nov-2022 00:28, PREVIOUS ECG IS PRESENT No significant change since last tracing Confirmed by Pattricia Boss [phone]) on 11/21/2022 11:00:05 AM  Radiology DG Foot 2 Views Left  Result Date: 11/21/2022 CLINICAL DATA:  Wound left fourth and fifth digit. Rule out osteomyelitis EXAM: LEFT FOOT - 2 VIEW COMPARISON:  Left foot 06/14/2022 FINDINGS: Negative for fracture or osteomyelitis. No acute skeletal abnormality. Wounds and bandages overlying the fourth and fifth toes. Mild calcaneal spurring. IMPRESSION: 1. Negative for fracture or osteomyelitis. 2. Mild calcaneal spurring. Electronically Signed   By: Franchot Gallo M.D.   On: 11/21/2022 09:15    Procedures .Critical Care  Performed by: Pattricia Boss, MD Authorized by: Pattricia Boss, MD   Critical care provider statement:    Critical care time (minutes):  30   Critical care  end time:  11/21/2022 11:31 AM   Critical care was necessary to treat or prevent imminent or life-threatening deterioration of the following conditions:  Circulatory failure   Critical care was time spent personally by me on the following activities:  Development of treatment plan with patient or surrogate, discussions with consultants, evaluation of patient's response to treatment, examination of patient, ordering and review of laboratory studies, ordering and review of radiographic studies, ordering and performing treatments and interventions, pulse oximetry, re-evaluation of patient's condition and review of old charts     Medications Ordered in ED Medications  fentaNYL (SUBLIMAZE) injection 12.5 mcg (12.5 mcg Intravenous Given 11/21/22 1019)  ceFAZolin (ANCEF) IVPB 2g/100 mL premix (0 g Intravenous Stopped 11/21/22 1102)    ED Course/ Medical Decision Making/ A&P Clinical Course as of 11/21/22 1132  Wed Nov 21, 2022  1017 CBC reviewed interpreted significant for leukocytosis with white blood cell count of 13,000 and some decreased hemoglobin which has been trending down most recently was 10 today is 9.2 [DR]  8003 Complete metabolic panel reviewed interpreted and is significant for mildly elevated potassium at 5.5, hyperglycemia with glucose of 227 [DR]  1017 Creatinine elevated at 1.76, and Crea [DR]  1018 Creatinine elevation is stable over the past month but appears to be new from 5 months ago. [DR]  1024 Vascular pa at bedside [DR]  1059 X-Chael Urenda left foot reviewed interpreted no evidence of acute fracture noted and radiologist interpretation notes negative for fracture or osteomyelitis [DR]    Clinical Course User Index [DR] Pattricia Boss, MD                           Medical Decision Making 81 year old female with vascular insufficiency and worsening pain Left leg with wounds and cellulitis noted. Patient treated here with Ance[facility] Vascular surgery consulted.  Discussed with Dr.  Trula Slade.  He plans OR in the morning He request patient be admitted to medicine service Medicine service has been paged 1- vascular insufficiency- bypass surgery tomorrow NPO after midnight 2-cellulitis/infection- ancef given 3- anemia Discussed with Dr. Jerilee Hoh on-call for family practice and they will see for admission 4 renal insufficiency- AKI vs ckd   Amount and/or Complexity of Data Reviewed External Data Reviewed: notes. Labs: ordered. Decision-making details documented in ED Course. Radiology: ordered and independent interpretation performed. Decision-making details documented in ED Course. ECG/medicine tests: ordered and independent interpretation performed. Decision-making details documented in ED Course.  Risk Prescription drug management. Decision regarding hospitalization.           Final Clinical Impression(s) / ED Diagnoses Final diagnoses:  Acute lower extremity ischemia  Cellulitis of foot    Rx / DC Orders ED Discharge Orders     None         Pattricia Boss, MD 11/21/22 1133

## 2022-11-21 NOTE — Hospital Course (Addendum)
Wendy [name] is a 81 y.o.female with a history of chronic critical limb ischemia s/p stent with vascular surgery. PMH includes HTN, CKD, T2DM, Sjogren syndrome, anemia, GERD, HLD who was admitted to the [facility] Medicine Teaching Service at [facility] for bilateral leg pain. Her hospital course is detailed below:  Critical limb ischemia, bilateral Presents with severe bilateral leg pain associated with black eschar and ulceration of toes on L foot. H/o severe critical LE ischemia requiring revascularization that patient opted to delayed until 11/2022 to spend holidays at home. Vascular surgery consulted and patient had left leg femoral to peroneal artery bypass on 12/28. She had early graft occlusion and returned to the OR on 12/29 for thrombectomy which again was occluded. Vascular was not able to revascularize and presented patient with the option for palliative above knee amputation on the left and likely on the right. Palliative was consulted and there were ongoing discussions about goals of care which ultimate resulted in the L AKA on 1/3. Her pain was well controled with scheduled tylenol and Toradol and she was discharged to SNF on 1/9 with follow up scheduled with vascular surgery outpatient.    Pain Management Protocol:  Basal: Tylenol 1,000 mg every 6 hours scheduled  Moderate Pain: Tramadol 50 mg every 6 hours alternated with tylenol  Severe Breakthrough Pain: 2.5 mg oxycodone every 4 hours as needed  Other chronic conditions were medically managed with home medications and formulary alternatives as necessary (CKD, dementia)  PCP Follow-up Recommendations:  PVD operations: pain currently controlled with medications. If pain persists can consider palliative surgical intervention.  Diabetes: Given age and risk for falls given PVD and extremity neurologic changes, glimepiride and pioglitazone were discontinued and 20 units long acting insulin was started.

## 2022-11-21 NOTE — Assessment & Plan Note (Addendum)
Morning fasting CBG 90. This is the first true day of receiving LA insulin. No changes made today.   - increase Semglee to 20 units daily (1/8) first dose received at night  - CBGs QAC and QHS - Increase SSI to moderate

## 2022-11-21 NOTE — Assessment & Plan Note (Addendum)
POD 6 from L AKA. Received tramadol overnight. Pain in the right leg increased this morning.  Clarified with vascular surgery and Lorriane Shire that plan is to discharge to SNF today with pain control through p.o. medications to give family more time to discuss if they would like further surgical intervention on the right. - Vascular following, appreciate recs - DVT ppx with subq heparin - Daily ASA - Bowel regimen: Miralax BID, senna BID, dulcolax  - Pain Management  Tylenol '1000mg'$  q6h  Gabapentin '200mg'$  TID  Tramadol 50 mg q12h PRN moderate pain  QHS 2.'5mg'$  QHS PRN severe pain

## 2022-11-21 NOTE — Progress Notes (Signed)
Pharmacy Antibiotic Note  Wendy [name] is a 81 y.o. female admitted on 11/20/2022 presenting with limb ischemia, toe ulceration and concern for cellulitis.  Pharmacy has been consulted for vancomycin and cefepime dosing.  Plan: Vancomycin 1g IV x 1, then 750 mg IV q 48h (eAUC 435) Cefepime 2g IV every 24 hours Monitor renal function, Cx and clinical progression to narrow Vancomycin levels as needed     Temp (24hrs), Avg:98.8 F (37.1 C), Min:98 F (36.7 C), Max:99.9 F (37.7 C)  Recent Labs  Lab 11/20/22 1638  WBC 13.0*  CREATININE 1.76*    Estimated Creatinine Clearance: 21.2 mL/min (A) (by C-G formula based on SCr of 1.76 mg/dL (H)).    Allergies  Allergen Reactions   Codeine    Erythromycin    Leflunomide Other (See Comments)   Lisinopril    Naproxen    Penicillins     Filled Augmentin 09/2022   Pioglitazone    Valsartan Other (See Comments)    Bertis Ruddy, PharmD, [facility] Clinical Pharmacist ED Pharmacist Phone # [phone] 11/21/2022 4:54 PM

## 2022-11-21 NOTE — ED Notes (Signed)
Vascular at bedside

## 2022-11-21 NOTE — Progress Notes (Signed)
ANTICOAGULATION CONSULT NOTE - Initial Consult  Pharmacy Consult for heparin Indication: Ischemic limb  Allergies  Allergen Reactions   Codeine    Erythromycin    Leflunomide Other (See Comments)   Lisinopril    Naproxen    Penicillins     Filled Augmentin 09/2022   Pioglitazone    Valsartan Other (See Comments)    Patient Measurements:   Heparin Dosing Weight: TBW  Vital Signs: Temp: 98 F (36.7 C) (12/27 1448) Temp Source: Oral (12/27 1021) BP: 154/78 (12/27 1448) Pulse Rate: 55 (12/27 1445)  Labs: Recent Labs    11/20/22 1638  HGB 9.2*  HCT 31.8*  PLT 328  CREATININE 1.76*    Estimated Creatinine Clearance: 21.2 mL/min (A) (by C-G formula based on SCr of 1.76 mg/dL (H)).   Medical History: Past Medical History:  Diagnosis Date   Acquired hammer toes of both feet    Anemia    Anxiety    Arthritis    Chronic kidney disease    Diabetes mellitus without complication (HCC)    Eczema    Environmental allergies    Fibrocystic breast changes    GERD (gastroesophageal reflux disease)    Hearing loss    Hypercholesteremia    Hypertension    IBS (irritable bowel syndrome)    Incontinence    Memory loss    Osteoarthritis    PVD (peripheral vascular disease) (HCC)    Rheumatoid arthritis ([place])     Assessment: Wendy [name] presenting with ischemic limb, she is not on anticoagulation PTA  Goal of Therapy:  Heparin level 0.3-0.7 units/ml Monitor platelets by anticoagulation protocol: Yes   Plan:  Heparin 3200 units IV x 1, and gtt at 850 units/hr F/u 8 hour heparin level  Bertis Ruddy, PharmD, [facility] Pharmacist ED Pharmacist Phone # [phone] 11/21/2022 4:44 PM

## 2022-11-21 NOTE — Consult Note (Addendum)
Hospital Consult    Reason for Consult:  bilateral lower extremity ischemia Requesting Physician:  Pattricia Boss MD MRN #:  [id number]  History of Present Illness: Wendy [name] is a 81 y.o. female with a PMH of CKD, DMII, HTN, memory loss, rheumatoid arthritis, IBS, and PAD presenting to the emergency room with bilateral foot pain.  The patient states that her bilateral foot pain has gotten worse over the past week, L>R.  Her left foot wounds have also gotten worse and started turning black.  She has also had some bilateral foot swelling.  She states that her pain in both feet has gotten so severe that she is unable to walk.  She endorses some drainage from the left foot and redness.  She endorses new wounds on the right foot.  She denies any fever, chills, trauma to bilateral lower extremities.  The last time we saw the patient was on 11/12/2022 for left lower extremity CO2 angiogram.  At that time she had a wound on her left great toe, and she was offered a bypass surgery.  The patient wanted to postpone the surgery until after the holidays so she could be with her family.  The plan was for left common femoral to peroneal artery bypass on 12/25/2021 with Dr. Donzetta Matters.  The pt is not on a statin for cholesterol management.  The pt is on a daily aspirin.   Other AC:   The pt is on HCTZ, amlodipine, and metoprolol for hypertension.   The pt is diabetic.   Tobacco hx:  never  Past Medical History:  Diagnosis Date   Acquired hammer toes of both feet    Anemia    Anxiety    Arthritis    Chronic kidney disease    Diabetes mellitus without complication (HCC)    Eczema    Environmental allergies    Fibrocystic breast changes    GERD (gastroesophageal reflux disease)    Hearing loss    Hypercholesteremia    Hypertension    IBS (irritable bowel syndrome)    Incontinence    Memory loss    Osteoarthritis    PVD (peripheral vascular disease) ([place])    Rheumatoid arthritis ([place])     Past  Surgical History:  Procedure Laterality Date   ABDOMINAL AORTOGRAM W/LOWER EXTREMITY N/A 11/12/2022   Procedure: ABDOMINAL AORTOGRAM W/LOWER EXTREMITY;  Surgeon: Waynetta Sandy, MD;  Location: [facility];  Service: Cardiovascular;  Laterality: N/A;   ABDOMINAL HYSTERECTOMY     SHOULDER SURGERY     FATTY TUMOR REMOVED    Allergies  Allergen Reactions   Codeine    Erythromycin    Leflunomide Other (See Comments)   Lisinopril    Naproxen    Penicillins     Filled Augmentin 09/2022   Pioglitazone    Valsartan Other (See Comments)    Prior to Admission medications   Medication Sig Start Date End Date Taking? Authorizing Provider  amLODipine (NORVASC) 2.5 MG tablet Take 2.5 mg by mouth 2 (two) times daily.    [provider]  aspirin EC 81 MG tablet Take 81 mg by mouth daily.    [provider]  cilostazol (PLETAL) 100 MG tablet Take 1 tablet (100 mg total) by mouth daily. 04/13/20   Janora Norlander, DO  cloNIDine (CATAPRES - DOSED IN MG/24 HR) 0.2 mg/24hr patch Place 1 patch (0.2 mg total) onto the skin once a week. 09/07/20   Janora Norlander, DO  cloNIDine (CATAPRES -  DOSED IN MG/24 HR) 0.3 mg/24hr patch 0.3 mg once a week.    [provider]  cycloSPORINE (RESTASIS) 0.05 % ophthalmic emulsion Place 1 drop into both eyes 2 (two) times daily. 04/13/20   Janora Norlander, DO  diclofenac Sodium (VOLTAREN) 1 % GEL Apply 2 g topically daily.    [provider]  doxycycline (VIBRA-TABS) 100 MG tablet Take 1 tablet (100 mg total) by mouth 2 (two) times daily. 05/15/22   Trula Slade, DPM  doxycycline (VIBRA-TABS) 100 MG tablet Take 1 tablet (100 mg total) by mouth 2 (two) times daily. 09/19/22   Wallene Huh, DPM  epoetin alfa-epbx (RETACRIT) 3000 UNIT/ML injection 3,000 Units every 14 (fourteen) days.    Bhutani, Manpreet S, MD  folic acid (FOLVITE) 1 MG tablet Take 1 tablet (1 mg total) by mouth daily. 05/06/20    Janora Norlander, DO  furosemide (LASIX) 20 MG tablet SMARTSIG:1 Tablet(s) By Mouth Morning-Evening    [provider]  gabapentin (NEURONTIN) 100 MG capsule TAKE 1 TO 2 CAPSULES BY MOUTH AT BEDTIME AS NEEDED 08/10/22   [provider]  glimepiride (AMARYL) 4 MG tablet Take 4 mg by mouth every morning. 12/01/21   [provider]  hydrochlorothiazide (HYDRODIURIL) 12.5 MG tablet Take 1 tablet (12.5 mg total) by mouth daily. 08/18/20   Janora Norlander, DO  irbesartan (AVAPRO) 75 MG tablet Take 75 mg by mouth at bedtime. 06/07/22   [provider]  meclizine (ANTIVERT) 25 MG tablet TAKE 1 TABLET BY MOUTH THREE TIMES DAILY AS NEEDED FOR DIZZINESS 09/27/20   Ronnie Doss M, DO  memantine (NAMENDA) 10 MG tablet Take 1 tablet (10 mg total) by mouth in the morning and at bedtime. 04/13/20   Janora Norlander, DO  metFORMIN (GLUCOPHAGE) 1000 MG tablet Take 0.5 tablets (500 mg total) by mouth 2 (two) times daily with a meal. Renal dose 03/07/20   Ronnie Doss M, DO  metoprolol succinate (TOPROL-XL) 25 MG 24 hr tablet Take 25 mg by mouth daily.    [provider]  mirtazapine (REMERON) 15 MG tablet Take 15 mg by mouth at bedtime.    [provider]  Omega-3 Fatty Acids (FISH OIL) 1000 MG CAPS Take 1,000 mg by mouth daily.    [provider]  ondansetron (ZOFRAN) 4 MG tablet Take 1 tablet (4 mg total) by mouth every 6 (six) hours as needed for nausea or vomiting. 01/06/22   Pollina, Gwenyth Allegra, MD  pantoprazole (PROTONIX) 40 MG tablet Take 1 tablet (40 mg total) by mouth daily. 01/06/22   Orpah Greek, MD  predniSONE (DELTASONE) 5 MG tablet Take 5-10 mg by mouth daily as needed. 08/31/22   [provider]  rivastigmine (EXELON) 9.5 mg/24hr APPLY 1 PATCH TOPICALLY ONCE DAILY 12/29/20   Ronnie Doss M, DO  senna (SENOKOT) 8.6 MG tablet Take 1 tablet by mouth daily.    [provider]   sulfamethoxazole-trimethoprim (BACTRIM DS) 800-160 MG tablet Take 1 tablet by mouth 2 (two) times daily. 12/25/21   Summerlin, Berneice Heinrich, PA-C  traMADol (ULTRAM) 50 MG tablet Take 1 tablet (50 mg total) by mouth every 6 (six) hours as needed. 11/14/22   Veryl Speak, MD  Vibegron (GEMTESA) 75 MG TABS Take 75 mg by mouth daily. 12/21/21   Summerlin, Berneice Heinrich, PA-C    Social History   Socioeconomic History   Marital status: Divorced    Spouse name: Not on file   Number  of children: Not on file   Years of education: Not on file   Highest education level: Not on file  Occupational History   Occupation: Retired    Comment: Social research officer, government for [employer]  Tobacco Use   Smoking status: Never   Smokeless tobacco: Never  Vaping Use   Vaping Use: Never used  Substance and Sexual Activity   Alcohol use: No   Drug use: No   Sexual activity: Not on file  Other Topics Concern   Not on file  Social History Narrative   Never Smoked   No Alcohol   No Recreational Drug Use   Retired from [employer], [employer] work   Marital Status: Divorced since 2003   Children: 5 kids, 12 grandchildren, 76 great grandchildren   Religion: [place]   Right handed    Lives with family    Social Determinants of Health   Financial Resource Strain: [place]  (10/05/2020)   Overall Financial Resource Strain (CARDIA)    Difficulty of Paying Living Expenses: Not very hard  Food Insecurity: No Food Insecurity (10/05/2020)   Hunger Vital Sign    Worried About Running Out of Food in the Last Year: Never true    [place] in the Last Year: Never true  Transportation Needs: No Transportation Needs (10/05/2020)   PRAPARE - Hydrologist (Medical): No    Lack of Transportation (Non-Medical): No  Physical Activity: Inactive (10/05/2020)   Exercise Vital Sign    Days of Exercise per Week: 0 days    Minutes of Exercise per Session: 0 min  Stress: Not on file  Social  Connections: Socially Isolated (12/08/2020)   Social Connection and Isolation Panel [NHANES]    Frequency of Communication with Friends and Family: Twice a week    Frequency of Social Gatherings with Friends and Family: Never    Attends Religious Services: 1 to 4 times per year    Active Member of [employer] or Organizations: No    Attends Archivist Meetings: Never    Marital Status: Widowed  Human resources officer Violence: Not on file     Family History  Problem Relation Age of Onset   Diabetes Mother    Hypertension Mother    Alzheimer's disease Mother    Heart disease Father    Hypertension Sister    Diabetes Sister    Heart disease Sister    Hypertension Brother    Diabetes Brother    Cancer Sister        lung   Heart disease Sister     ROS: '[x]'$  Positive   '[ ]'$  Negative   '[ ]'$  All sytems reviewed and are negative  Cardiac: '[]'$  chest pain/pressure '[]'$  hx MI '[]'$  SOB   Vascular: '[x]'$  pain in legs while walking '[x]'$  pain in legs at rest '[x]'$  pain in legs at night '[x]'$  non-healing ulcers '[]'$  hx of DVT '[]'$  swelling in legs  Pulmonary: '[]'$  asthma/wheezing '[]'$  home O2  Neurologic: '[]'$  hx of CVA '[]'$  mini stroke   Hematologic: '[]'$  hx of cancer  Endocrine:   '[x]'$  diabetes '[]'$  thyroid disease  GI '[x]'$  GERD  GU: '[x]'$  CKD/renal failure '[]'$  HD--'[]'$  M/W/F or '[]'$  T/T/S  Psychiatric: '[]'$  anxiety '[]'$  depression  Musculoskeletal: '[x]'$  arthritis '[]'$  joint pain  Integumentary: '[]'$  rashes '[]'$  ulcers  Constitutional: '[]'$  fever  '[]'$  chills  Physical Examination  Vitals:   11/21/22 0825 11/21/22 0915  BP: (!) 163/73 (!) 166/77  Pulse:  70   Resp: 10 20  Temp:    SpO2: 98%    There is no height or weight on file to calculate BMI.  General:  WDWN in NAD Gait: Not observed HENT: WNL, normocephalic Pulmonary: normal non-labored breathing Cardiac: regular, without carotid bruit Abdomen:  soft, NT; aortic pulse is non palpable Skin: without rashes Vascular Exam/Pulses: Palpable  femoral pulses. Dampened monophasic DP doppler signals. I cannot find any PT or peroneal doppler signals bilaterally.  Extremities: small ulcerations on multiple toes of the right foot with swelling. Erythema and swelling of the left foot. Ulcerations on multiple toes of the left foot with gangrene of all five toes. Blistering on the dorsal left foot  Musculoskeletal: intact motor and sensation of right foot. Intact sensation of left foot, reduced motor of left foot. Neurologic: A&O X 3 Psychiatric:  The pt has Normal affect.       CBC    Component Value Date/Time   WBC 13.0 (H) 11/20/2022 1638   RBC 3.99 11/20/2022 1638   HGB 9.2 (L) 11/20/2022 1638   HGB 9.0 (L) 05/03/2020 1005   HCT 31.8 (L) 11/20/2022 1638   HCT 28.9 (L) 05/03/2020 1005   PLT 328 11/20/2022 1638   PLT 476 (H) 05/03/2020 1005   MCV 79.7 (L) 11/20/2022 1638   MCV 78 (L) 05/03/2020 1005   MCH 23.1 (L) 11/20/2022 1638   MCHC 28.9 (L) 11/20/2022 1638   RDW 14.9 11/20/2022 1638   RDW 15.5 (H) 05/03/2020 1005   LYMPHSABS 1.4 11/20/2022 1638   LYMPHSABS 1.8 05/03/2020 1005   MONOABS 1.2 (H) 11/20/2022 1638   EOSABS 0.0 11/20/2022 1638   EOSABS 0.1 05/03/2020 1005   BASOSABS 0.0 11/20/2022 1638   BASOSABS 0.1 05/03/2020 1005    BMET    Component Value Date/Time   NA 138 11/20/2022 1638   NA 140 07/26/2020 1506   K 5.5 (H) 11/20/2022 1638   CL 109 11/20/2022 1638   CO2 20 (L) 11/20/2022 1638   GLUCOSE 227 (H) 11/20/2022 1638   BUN 20 11/20/2022 1638   BUN 37 (H) 07/26/2020 1506   CREATININE 1.76 (H) 11/20/2022 1638   CALCIUM 9.2 11/20/2022 1638   CALCIUM 10.1 06/07/2022 0938   GFRNONAA 29 (L) 11/20/2022 1638   GFRAA 32 (L) 08/02/2020 1410    COAGS: No results found for: "INR", "PROTIME"   Non-Invasive Vascular Imaging:   DG LEFT FOOT  IMPRESSION: 1. Negative for fracture or osteomyelitis. 2. Mild calcaneal spurring.   ASSESSMENT/PLAN: This is a 81 y.o. female with bilateral chronic lower  extremity ischemia   -The patient's LLE ischemia and wounds have worsened significantly in the past 2 weeks. There is dry gangrene of all 5 digits on the left foot, with dorsal foot blistering and evidence of skin infection in the left foot. Xray is negative for osteomyelitis. There is a dampened monophasic DP doppler signal -The patients right foot rest pain has also worsened with new small ulcerations on multiple digits. No evidence of infection or tissue ischemia. There is also a dampened monophasic DP doppler signal -Prior plan was for L common femoral to peroneal artery bypass later in January, however the patient's condition has worsened. We have explained to the patient that she will likely at least need a TMA on the left side and possibly AKA. At this time she would like to pursue bypass to see what tissue is salvageable -Would recommend admission by hospitalist team, initiation of antibiotics for the left  foot and podiatry consultation. She will be scheduled for left common femoral to peroneal artery bypass with Dr.Thana Ramp on 12/28. Will place NPO and consent orders -She will also require intervention on for the right leg in the near future  Vicente Serene, PA-C [facility] [phone]   I agree with the above.  Have seen and evaluate the patient.  Briefly this is an 81 year old female who initially saw Dr. Donnetta Hutching for bilateral breast pain.  She underwent angiography in mid December with Dr. Donzetta Matters.  She was recommended for a left femoral to peroneal bypass.  This was initially offered for the following day but the patient wanted to spend Christmas at home.  She came to the ER today with worsening pain and skin discoloration of the left foot which is keeping her up all night.  She has had vein mapping which shows a very small vein.  The son is with her.  I discussed that she has a high likelihood of requiring a transtibial or transfemoral amputation.  If we can optimize her blood  flow, she potentially could get a transmetatarsal amputation.  I have recommended that she gets admitted to the hospital for IV antibiotics.  I will plan on a left femoral to peroneal bypass graft with PTFE tomorrow.  I did discuss the limited durability with Gore-Tex graft.  However, I do not see any other options if we attempt to try and salvage her leg.  She will need to be n.p.o. after midnight.  [name]

## 2022-11-21 NOTE — Progress Notes (Signed)
FMTS Brief Progress Note  S: Night rounding.  Patient found sleeping in bed, no acute distress.  Awakes easily.  Discussed plan for surgery tomorrow, she is aware.  She reports pain medication received earlier is helping her pain quite a bit.  No complaints at this time.  Does voice that her room is "quite small".  O: BP (!) 170/71   Pulse 77   Temp 98 F (36.7 C)   Resp 19   SpO2 100%   General: Awake, alert, NAD Respiratory: Speaking clearly in full sentences, no respiratory distress, unlabored respirations Extremities: Ulcerated toes anteriorly, BLE with woody stigmata, feet cool, pedal pulses difficult to palpate  A/P: PVD and critical ischemia of left leg Vascular on board, plan for surgery tomorrow.  Currently scheduled for arterial bypass of left common femoral to peroneal artery 12/28 at 11:25 AM in OR 16. - Scheduled pain control: Tylenol 650 every 6 - As needed pain meds: Oxycodone IR 2.5-5 mg - Has vancomycin and cefepime ordered, will defer antibiotics to vascular team  Uncontrolled diabetes Currently awaiting A1c, is in process.  Previously uncontrolled.  Will need excellent glucose control for wound healing, especially if we are headed towards an amputation.  Glucose checks since admission ranged 131-227, received 2 units sliding scale in last 24 hours.  Given age and risk for falls given PVD and extremity neurologic changes, would recommend against any diabetes medications I could cause hypoglycemia such as her home glimepiride and pioglitazone. - Continue CBG monitoring 4 times daily before meals and at bedtime - Continue sensitive sliding scale insulin - Once we have more glucose checks data we can consider basal insulin  Ezequiel Essex, MD 11/21/2022, 10:07 PM PGY-3, [facility] Night Resident  Please page [phone] with questions.

## 2022-11-21 NOTE — Addendum Note (Signed)
Addended by: Nicholas Lose on: 11/21/2022 10:57 AM   Modules accepted: Orders

## 2022-11-21 NOTE — Assessment & Plan Note (Addendum)
Home medications include clonidine patch 0.3 mg weekly, irbesartan 75 mg, hydrochlorothiazide 12.5 mg, metoprolol succinate 25 mg.  - Cont home metoprolol '25mg'$  daily - Cont amlodipine 5 - Holding irbesartan and HCTZ given hyperkalemia

## 2022-11-21 NOTE — ED Notes (Signed)
Help get patient changed placed a external cath patient is resting with family at bedside and call bell in reach

## 2022-11-21 NOTE — H&P (Addendum)
Hospital Admission History and Physical Service Pager: [phone]  Patient name: Wendy [name] Medical record number: [id number] Date of Birth: 1941/07/13 Age: 81 y.o. Gender: female  Primary Care Provider: Default, Provider, MD Consultants: Vascular surgery Code Status: Full which was confirmed with patient and family Preferred Emergency Contact:  Contact Information     Name Relation Home Work [place] Son [phone]  [phone]   Jerilee Hoh Daughter   [phone]      Chief Complaint: Bilateral leg pain  Assessment and Plan: Wendy [name] is a 81 y.o. female presenting with bilateral leg pain secondary to chronic severe critical limb ischemia. Ischemia worsening with new onset ulceration in the setting of uncontrolled diabetes. Afebrile with mild leukocytosis with low concern for sepsis at this time.  * Critical limb ischemia of both lower extremities (HCC) Chronic worsening PVD with recent abdominal aortogram in 11/12/22 to salvage severe bilateral limb ischemia. Patient with persistent edema and unable to ambulate due to pain. Presenting with ulceration of toes of L foot surrounded by black eschar. Vascular surgery urgently consulted, recommended surgery in AM for possible graft vs amputation. S/p Ancef dose in ED. Does not appear infected therefore not warranting further antibiotics but will discuss with vascular team. -Admitted to FMTS, Dr. Gwendlyn Deutscher attending -Vascular surgery consulting, appreciate recommendations -Discussed antibiotics with VVS -Plan for OR in AM, NPO at midnight -Heparin per pharmacy -Neurovascular checks q4h -Pain management: Tylenol '650mg'$  q6h, Oxycodone 2.5-'5mg'$  prn q4h -Bowel regimen: Miralax and Senna daily -Consider redosing abx -PT/OT for post-op mobilization  Essential hypertension Hypertensive to 180s/80s. On Clonidine, Irebsartan and Metoprolol at home, unclear compliance. -Hold Irbesartan in the setting of  hyperkalemia -Restart home Metoprolol '25mg'$  daily  Type 2 diabetes mellitus with kidney complication, without long-term current use of insulin (HCC) A1c 10.4 in 12/2020. On Metformin, Pioglitazone and Glimepiride at home, likely non-compliant with reported sugars in 300s the past few weeks.  Glucose 131 currently. -CBGs + sSSI -A1c ordered  Chronic conditions: CKD: Avoid nephrotoxic agents Dementia: restart Memantine '10mg'$ ; delirium precautions   FEN/GI: Heart healthy, NPO at midnight for surgery VTE Prophylaxis: Heparin  Disposition: Progressive  History of Present Illness:  Wendy [name] is a 81 y.o. female presenting with bilateral leg pain and ulceration of L toes. Patient had abdominal aortogram on 11/12/2022 and she states the pain and swelling have been worse since that time. She has been unable to walk when previously she could ambulate. Pain is worse when her legs are touched or bumped. She takes tramadol every 6 hours at home which helps her from crying related to the pain. States she noticed ulceration on her L toes two days ago and decided to come to the hospital. Denies difficulty breathing, chest pain and fever. Endorses constipation, last BM 2-3 days ago.  Patient was last seen by vascular surgery on 11/07/2022 and deemed to have severe bilateral lower extremity ischemia with previously scheduled surgery in January for limb salvage.  In the ED, patient received Ancef 2g IV and Fentanyl 12.5 mcg x1. Evaluated by vascular surgery and posted for surgery tomorrow AM.   Review Of Systems: Per HPI  Pertinent Past Medical History: HTN CKD T2DM Sjogren's syndrome Anemia GERD HLD Remainder reviewed in history tab.   Pertinent Past Surgical History: Abdominal hysterectomy Shoulder surgery  Remainder reviewed in history tab.   Pertinent Social History: Tobacco use: never Alcohol use: never Other Substance use: none Lives with son  Pertinent Family History:  Mother:  diabetes, HTN, alzheimer's Father: heart disease Sister: HTN, DM Brother: HTN, DM Remainder reviewed in history tab.   Important Outpatient Medications: Aspirin '81mg'$  daily Clonidine patch Folic acid Lasix '20mg'$  daily Glimepiride '4mg'$  daily Irbesartan '75mg'$  daily Memantine '10mg'$  daily Metformin '500mg'$  BID Metoprolol '25mg'$  daily Mirtazapine '15mg'$  Pioglitazone '15mg'$  daily Tramadol '50mg'$  q6h Remainder reviewed in medication history.   Objective: BP (!) 154/78   Pulse (!) 55   Temp 98 F (36.7 C)   Resp 18   SpO2 99%  Exam: General: Elderly female. Alert. NAD Eyes: PERRLA. EOMI intact ENTM: MMM Neck: Supple Cardiovascular: RRR without murmur, thready L pedal pulse Respiratory: CTAB. Normal WOB on RA Gastrointestinal: Soft, non-tender, non-distended MSK: Mild to moderate peripheral edema. Derm: Ulceration of lateral 4 toes on L foot with surrounding black eschar (see media tab) Neuro: CN intact. Motor and sensation intact globally. Psych: Cooperative. Mild memory impairment  Labs:  CBC BMET  Recent Labs  Lab 11/20/22 1638  WBC 13.0*  HGB 9.2*  HCT 31.8*  PLT 328   Recent Labs  Lab 11/20/22 1638 11/21/22 1206  NA 138  --   K 5.5* 4.8  CL 109  --   CO2 20*  --   BUN 20  --   CREATININE 1.76*  --   GLUCOSE 227*  --   CALCIUM 9.2  --     Pertinent additional labs: Glu: 131 K 5.5>4.8  EKG: Background artifact but appears NSR  Imaging Studies Performed: DG Foot 2 Views Left Result Date: 11/21/2022 IMPRESSION: 1. Negative for fracture or osteomyelitis. 2. Mild calcaneal spurring.   Colletta Maryland, MD 11/21/2022, 1:52 PM PGY-1, [facility] Intern pager: [phone], text pages welcome Secure chat group [facility] Upper-Level Resident Addendum   I have independently interviewed and examined the patient. I have discussed the above with Dr. Jerilee Hoh and agree with the documented plan. My edits for  correction/addition/clarification are included above. Please see any attending notes.   Wells Guiles, DO PGY-2, [facility] 11/21/2022 3:34 PM  FPTS Service pager: [phone] (text pages welcome through [facility])

## 2022-11-22 ENCOUNTER — Other Ambulatory Visit: Payer: Self-pay

## 2022-11-22 ENCOUNTER — Inpatient Hospital Stay (HOSPITAL_COMMUNITY): Payer: Medicare PPO | Admitting: Certified Registered"

## 2022-11-22 ENCOUNTER — Encounter (HOSPITAL_COMMUNITY)
Admission: EM | Disposition: A | Discharge status: Skilled Nursing Facility | Payer: Self-pay | Source: Home / Self Care | Attending: Family Medicine

## 2022-11-22 ENCOUNTER — Encounter (HOSPITAL_COMMUNITY): Payer: Self-pay | Admitting: Student

## 2022-11-22 ENCOUNTER — Inpatient Hospital Stay (HOSPITAL_COMMUNITY): Admission: RE | Admit: 2022-11-22 | Payer: Medicare PPO | Source: Home / Self Care | Admitting: Surgery

## 2022-11-22 ENCOUNTER — Inpatient Hospital Stay (HOSPITAL_COMMUNITY): Payer: Medicare PPO

## 2022-11-22 DIAGNOSIS — L97429 Non-pressure chronic ulcer of left heel and midfoot with unspecified severity: Secondary | ICD-10-CM | POA: Diagnosis not present

## 2022-11-22 DIAGNOSIS — E11621 Type 2 diabetes mellitus with foot ulcer: Secondary | ICD-10-CM | POA: Diagnosis not present

## 2022-11-22 DIAGNOSIS — I70262 Atherosclerosis of native arteries of extremities with gangrene, left leg: Secondary | ICD-10-CM | POA: Diagnosis not present

## 2022-11-22 DIAGNOSIS — E1151 Type 2 diabetes mellitus with diabetic peripheral angiopathy without gangrene: Secondary | ICD-10-CM | POA: Diagnosis not present

## 2022-11-22 DIAGNOSIS — Z7984 Long term (current) use of oral hypoglycemic drugs: Secondary | ICD-10-CM

## 2022-11-22 DIAGNOSIS — I70223 Atherosclerosis of native arteries of extremities with rest pain, bilateral legs: Secondary | ICD-10-CM | POA: Diagnosis not present

## 2022-11-22 DIAGNOSIS — I10 Essential (primary) hypertension: Secondary | ICD-10-CM

## 2022-11-22 HISTORY — PX: BYPASS GRAFT FEMORAL-PERONEAL: SHX5762

## 2022-11-22 LAB — CBC
HCT: 29.2 % — ABNORMAL LOW (ref 36.0–46.0)
Hemoglobin: 8.8 g/dL — ABNORMAL LOW (ref 12.0–15.0)
MCH: 23.1 pg — ABNORMAL LOW (ref 26.0–34.0)
MCHC: 30.1 g/dL (ref 30.0–36.0)
MCV: 76.6 fL — ABNORMAL LOW (ref 80.0–100.0)
Platelets: 352 10*3/uL (ref 150–400)
RBC: 3.81 MIL/uL — ABNORMAL LOW (ref 3.87–5.11)
RDW: 15.1 % (ref 11.5–15.5)
WBC: 10.3 10*3/uL (ref 4.0–10.5)
nRBC: 0 % (ref 0.0–0.2)

## 2022-11-22 LAB — HEPATIC FUNCTION PANEL
ALT: 10 U/L (ref 0–44)
AST: 18 U/L (ref 15–41)
Albumin: 2.3 g/dL — ABNORMAL LOW (ref 3.5–5.0)
Alkaline Phosphatase: 44 U/L (ref 38–126)
Bilirubin, Direct: <0.1 mg/dL (ref 0.0–0.2)
Total Bilirubin: 0.6 mg/dL (ref 0.3–1.2)
Total Protein: 6.6 g/dL (ref 6.5–8.1)

## 2022-11-22 LAB — URINALYSIS, ROUTINE W REFLEX MICROSCOPIC
Bilirubin Urine: NEGATIVE
Glucose, UA: 50 mg/dL — AB
Hgb urine dipstick: NEGATIVE
Ketones, ur: NEGATIVE mg/dL
Nitrite: NEGATIVE
Protein, ur: 100 mg/dL — AB
Specific Gravity, Urine: 1.014 (ref 1.005–1.030)
pH: 5 (ref 5.0–8.0)

## 2022-11-22 LAB — PROTIME-INR
INR: 1.2 (ref 0.8–1.2)
Prothrombin Time: 14.7 seconds (ref 11.4–15.2)

## 2022-11-22 LAB — BASIC METABOLIC PANEL
Anion gap: 7 (ref 5–15)
BUN: 16 mg/dL (ref 8–23)
CO2: 21 mmol/L — ABNORMAL LOW (ref 22–32)
Calcium: 8.9 mg/dL (ref 8.9–10.3)
Chloride: 109 mmol/L (ref 98–111)
Creatinine, Ser: 1.72 mg/dL — ABNORMAL HIGH (ref 0.44–1.00)
GFR, Estimated: 29 mL/min — ABNORMAL LOW (ref >60)
Glucose, Bld: 196 mg/dL — ABNORMAL HIGH (ref 70–99)
Potassium: 5.1 mmol/L (ref 3.5–5.1)
Sodium: 137 mmol/L (ref 135–145)

## 2022-11-22 LAB — GLUCOSE, CAPILLARY
Glucose-Capillary: 203 mg/dL — ABNORMAL HIGH (ref 70–99)
Glucose-Capillary: 58 mg/dL — ABNORMAL LOW (ref 70–99)
Glucose-Capillary: 193 mg/dL — ABNORMAL HIGH (ref 70–99)
Glucose-Capillary: 96 mg/dL (ref 70–99)
Glucose-Capillary: 123 mg/dL — ABNORMAL HIGH (ref 70–99)
Glucose-Capillary: 229 mg/dL — ABNORMAL HIGH (ref 70–99)

## 2022-11-22 LAB — POCT ACTIVATED CLOTTING TIME
Activated Clotting Time: 255 seconds
Activated Clotting Time: 228 seconds

## 2022-11-22 LAB — SURGICAL PCR SCREEN
MRSA, PCR: NEGATIVE
Staphylococcus aureus: POSITIVE — AB

## 2022-11-22 LAB — HEPARIN LEVEL (UNFRACTIONATED): Heparin Unfractionated: 0.45 IU/mL (ref 0.30–0.70)

## 2022-11-22 LAB — ABO/RH: ABO/RH(D): O POS

## 2022-11-22 LAB — APTT: aPTT: 124 seconds — ABNORMAL HIGH (ref 24–36)

## 2022-11-22 SURGERY — CREATION, BYPASS, ARTERIAL, FEMORAL TO PERONEAL, USING GRAFT
Anesthesia: General | Laterality: Left

## 2022-11-22 MED ORDER — PHENYLEPHRINE 80 MCG/ML (10ML) SYRINGE FOR IV PUSH (FOR BLOOD PRESSURE SUPPORT)
PREFILLED_SYRINGE | INTRAVENOUS | Status: DC | PRN
  Administered 2022-11-22 (×6): 80 ug via INTRAVENOUS

## 2022-11-22 MED ORDER — MUPIROCIN 2 % EX OINT
1.0000 | TOPICAL_OINTMENT | Freq: Two times a day (BID) | CUTANEOUS | Status: AC
  Administered 2022-11-22 – 2022-11-26 (×6): 1 via NASAL
  Filled 2022-11-22: qty 22

## 2022-11-22 MED ORDER — ACETAMINOPHEN 160 MG/5ML PO SOLN: 325.0000 mg | Freq: Once | ORAL | Status: DC | PRN

## 2022-11-22 MED ORDER — INSULIN ASPART 100 UNIT/ML IJ SOLN
0.0000 [IU] | Freq: Three times a day (TID) | INTRAMUSCULAR | Status: DC
  Administered 2022-11-23: 3 [IU] via SUBCUTANEOUS
  Administered 2022-11-23: 2 [IU] via SUBCUTANEOUS
  Administered 2022-11-24: 1 [IU] via SUBCUTANEOUS
  Administered 2022-11-24 (×2): 3 [IU] via SUBCUTANEOUS
  Administered 2022-11-25: 2 [IU] via SUBCUTANEOUS
  Administered 2022-11-25: 3 [IU] via SUBCUTANEOUS
  Administered 2022-11-25: 1 [IU] via SUBCUTANEOUS
  Administered 2022-11-26 (×2): 2 [IU] via SUBCUTANEOUS
  Administered 2022-11-26 – 2022-11-27 (×2): 1 [IU] via SUBCUTANEOUS
  Administered 2022-11-27 (×2): 3 [IU] via SUBCUTANEOUS
  Administered 2022-11-28 (×2): 1 [IU] via SUBCUTANEOUS
  Administered 2022-11-28: 3 [IU] via SUBCUTANEOUS
  Administered 2022-11-29 (×2): 5 [IU] via SUBCUTANEOUS

## 2022-11-22 MED ORDER — ONDANSETRON HCL 4 MG/2ML IJ SOLN
INTRAMUSCULAR | Status: DC | PRN
  Administered 2022-11-22: 4 mg via INTRAVENOUS

## 2022-11-22 MED ORDER — PROTAMINE SULFATE 10 MG/ML IV SOLN
INTRAVENOUS | Status: DC | PRN
  Administered 2022-11-22 (×3): 10 mg via INTRAVENOUS

## 2022-11-22 MED ORDER — HEPARIN SODIUM (PORCINE) 1000 UNIT/ML IJ SOLN
INTRAMUSCULAR | Status: DC | PRN
  Administered 2022-11-22: 6000 [IU] via INTRAVENOUS

## 2022-11-22 MED ORDER — DEXTROSE-NACL 5-0.9 % IV SOLN: INTRAVENOUS | Status: DC

## 2022-11-22 MED ORDER — SODIUM CHLORIDE 0.9 % IV SOLN: 500.0000 mL | Freq: Once | INTRAVENOUS | Status: DC | PRN

## 2022-11-22 MED ORDER — DEXTROSE 250 MG/ML IV SOLN: 25.0000 g | Freq: Once | INTRAVENOUS | Status: AC

## 2022-11-22 MED ORDER — ROCURONIUM BROMIDE 10 MG/ML (PF) SYRINGE
PREFILLED_SYRINGE | INTRAVENOUS | Status: DC | PRN
  Administered 2022-11-22: 40 mg via INTRAVENOUS
  Administered 2022-11-22: 20 mg via INTRAVENOUS

## 2022-11-22 MED ORDER — LACTATED RINGERS IV SOLN: INTRAVENOUS | Status: DC

## 2022-11-22 MED ORDER — HEPARIN (PORCINE) 25000 UT/250ML-% IV SOLN
500.0000 [IU]/h | INTRAVENOUS | Status: DC
  Filled 2022-11-22: qty 250

## 2022-11-22 MED ORDER — ACETAMINOPHEN 10 MG/ML IV SOLN
1000.0000 mg | Freq: Once | INTRAVENOUS | Status: DC | PRN
  Administered 2022-11-22: 1000 mg via INTRAVENOUS

## 2022-11-22 MED ORDER — FENTANYL CITRATE (PF) 100 MCG/2ML IJ SOLN
INTRAMUSCULAR | Status: AC
  Filled 2022-11-22: qty 2

## 2022-11-22 MED ORDER — ACETAMINOPHEN 10 MG/ML IV SOLN
INTRAVENOUS | Status: AC
  Filled 2022-11-22: qty 100

## 2022-11-22 MED ORDER — CHLORHEXIDINE GLUCONATE 0.12 % MT SOLN
OROMUCOSAL | Status: AC
  Administered 2022-11-22: 15 mL via OROMUCOSAL
  Filled 2022-11-22: qty 15

## 2022-11-22 MED ORDER — HEMOSTATIC AGENTS (NO CHARGE) OPTIME
TOPICAL | Status: DC | PRN
  Administered 2022-11-22 (×3): 1 via TOPICAL

## 2022-11-22 MED ORDER — DOCUSATE SODIUM 100 MG PO CAPS: 100.0000 mg | ORAL_CAPSULE | Freq: Every day | ORAL | Status: DC

## 2022-11-22 MED ORDER — PANTOPRAZOLE SODIUM 40 MG PO TBEC
40.0000 mg | DELAYED_RELEASE_TABLET | Freq: Every day | ORAL | Status: DC
  Administered 2022-11-23 – 2022-12-04 (×11): 40 mg via ORAL
  Filled 2022-11-22 (×11): qty 1

## 2022-11-22 MED ORDER — HEPARIN 6000 UNIT IRRIGATION SOLUTION
Status: AC
  Filled 2022-11-22: qty 500

## 2022-11-22 MED ORDER — HYDRALAZINE HCL 20 MG/ML IJ SOLN
5.0000 mg | INTRAMUSCULAR | Status: DC | PRN
  Administered 2022-11-22: 5 mg via INTRAVENOUS
  Filled 2022-11-22: qty 1

## 2022-11-22 MED ORDER — FENTANYL CITRATE (PF) 250 MCG/5ML IJ SOLN
INTRAMUSCULAR | Status: AC
  Filled 2022-11-22: qty 5

## 2022-11-22 MED ORDER — BISACODYL 10 MG RE SUPP: 10.0000 mg | Freq: Every day | RECTAL | Status: DC | PRN

## 2022-11-22 MED ORDER — SUGAMMADEX SODIUM 200 MG/2ML IV SOLN
INTRAVENOUS | Status: DC | PRN
  Administered 2022-11-22: 200 mg via INTRAVENOUS

## 2022-11-22 MED ORDER — ACETAMINOPHEN 650 MG RE SUPP: 325.0000 mg | RECTAL | Status: DC | PRN

## 2022-11-22 MED ORDER — ORAL CARE MOUTH RINSE: 15.0000 mL | Freq: Once | OROMUCOSAL | Status: AC

## 2022-11-22 MED ORDER — HEPARIN (PORCINE) 25000 UT/250ML-% IV SOLN
INTRAVENOUS | Status: AC
  Administered 2022-11-22: 500 [IU]/h via INTRAVENOUS
  Filled 2022-11-22: qty 250

## 2022-11-22 MED ORDER — HYDROMORPHONE HCL 1 MG/ML IJ SOLN
0.5000 mg | INTRAMUSCULAR | Status: DC | PRN
  Administered 2022-11-22: 0.5 mg via INTRAVENOUS
  Filled 2022-11-22: qty 0.5

## 2022-11-22 MED ORDER — ACETAMINOPHEN 325 MG PO TABS: 325.0000 mg | ORAL_TABLET | Freq: Once | ORAL | Status: DC | PRN

## 2022-11-22 MED ORDER — PHENYLEPHRINE HCL-NACL 20-0.9 MG/250ML-% IV SOLN
INTRAVENOUS | Status: DC | PRN
  Administered 2022-11-22: 20 ug/min via INTRAVENOUS

## 2022-11-22 MED ORDER — OXYCODONE HCL 5 MG PO TABS
5.0000 mg | ORAL_TABLET | ORAL | Status: DC | PRN
  Administered 2022-11-23: 10 mg via ORAL
  Filled 2022-11-22 (×2): qty 2

## 2022-11-22 MED ORDER — HYDROMORPHONE HCL 1 MG/ML IJ SOLN
0.5000 mg | INTRAMUSCULAR | Status: DC | PRN
  Administered 2022-11-22: 1 mg via INTRAVENOUS
  Filled 2022-11-22: qty 1

## 2022-11-22 MED ORDER — LABETALOL HCL 5 MG/ML IV SOLN
INTRAVENOUS | Status: AC
  Filled 2022-11-22: qty 4

## 2022-11-22 MED ORDER — ALUM & MAG HYDROXIDE-SIMETH 200-200-20 MG/5ML PO SUSP: 15.0000 mL | ORAL | Status: DC | PRN

## 2022-11-22 MED ORDER — SODIUM CHLORIDE 0.9 % IV SOLN: INTRAVENOUS | Status: DC

## 2022-11-22 MED ORDER — LABETALOL HCL 5 MG/ML IV SOLN: 10.0000 mg | INTRAVENOUS | Status: DC | PRN

## 2022-11-22 MED ORDER — PROMETHAZINE HCL 25 MG/ML IJ SOLN: 6.2500 mg | INTRAMUSCULAR | Status: DC | PRN

## 2022-11-22 MED ORDER — FENTANYL CITRATE (PF) 250 MCG/5ML IJ SOLN
INTRAMUSCULAR | Status: DC | PRN
  Administered 2022-11-22 (×2): 50 ug via INTRAVENOUS
  Administered 2022-11-22: 100 ug via INTRAVENOUS

## 2022-11-22 MED ORDER — AMISULPRIDE (ANTIEMETIC) 5 MG/2ML IV SOLN: 10.0000 mg | Freq: Once | INTRAVENOUS | Status: DC | PRN

## 2022-11-22 MED ORDER — FENTANYL CITRATE (PF) 100 MCG/2ML IJ SOLN
25.0000 ug | INTRAMUSCULAR | Status: DC | PRN
  Administered 2022-11-22: 25 ug via INTRAVENOUS

## 2022-11-22 MED ORDER — GABAPENTIN 100 MG PO CAPS
200.0000 mg | ORAL_CAPSULE | Freq: Three times a day (TID) | ORAL | Status: DC
  Administered 2022-11-22 – 2022-12-04 (×31): 200 mg via ORAL
  Filled 2022-11-22 (×33): qty 2

## 2022-11-22 MED ORDER — POTASSIUM CHLORIDE CRYS ER 20 MEQ PO TBCR: 20.0000 meq | EXTENDED_RELEASE_TABLET | Freq: Every day | ORAL | Status: DC | PRN

## 2022-11-22 MED ORDER — PROPOFOL 10 MG/ML IV BOLUS
INTRAVENOUS | Status: DC | PRN
  Administered 2022-11-22: 70 mg via INTRAVENOUS

## 2022-11-22 MED ORDER — PROPOFOL 10 MG/ML IV BOLUS
INTRAVENOUS | Status: AC
  Filled 2022-11-22: qty 20

## 2022-11-22 MED ORDER — GUAIFENESIN-DM 100-10 MG/5ML PO SYRP: 15.0000 mL | ORAL_SOLUTION | ORAL | Status: DC | PRN

## 2022-11-22 MED ORDER — FENTANYL CITRATE (PF) 100 MCG/2ML IJ SOLN
25.0000 ug | Freq: Once | INTRAMUSCULAR | Status: AC
  Administered 2022-11-22: 25 ug via INTRAVENOUS

## 2022-11-22 MED ORDER — ACETAMINOPHEN 325 MG PO TABS: 325.0000 mg | ORAL_TABLET | ORAL | Status: DC | PRN

## 2022-11-22 MED ORDER — CHLORHEXIDINE GLUCONATE CLOTH 2 % EX PADS
6.0000 | MEDICATED_PAD | Freq: Every day | CUTANEOUS | Status: AC
  Administered 2022-11-22 – 2022-11-26 (×3): 6 via TOPICAL

## 2022-11-22 MED ORDER — LIDOCAINE 2% (20 MG/ML) 5 ML SYRINGE
INTRAMUSCULAR | Status: DC | PRN
  Administered 2022-11-22: 60 mg via INTRAVENOUS

## 2022-11-22 MED ORDER — SODIUM CHLORIDE 0.9 % IV SOLN: INTRAVENOUS | Status: DC | PRN

## 2022-11-22 MED ORDER — POLYETHYLENE GLYCOL 3350 17 G PO PACK: 17.0000 g | PACK | Freq: Every day | ORAL | Status: DC | PRN

## 2022-11-22 MED ORDER — CHLORHEXIDINE GLUCONATE 0.12 % MT SOLN: 15.0000 mL | Freq: Once | OROMUCOSAL | Status: AC

## 2022-11-22 MED ORDER — ROSUVASTATIN CALCIUM 5 MG PO TABS
10.0000 mg | ORAL_TABLET | Freq: Every day | ORAL | Status: DC
  Administered 2022-11-23 – 2022-12-04 (×12): 10 mg via ORAL
  Filled 2022-11-22 (×13): qty 2

## 2022-11-22 MED ORDER — PHENOL 1.4 % MT LIQD: 1.0000 | OROMUCOSAL | Status: DC | PRN

## 2022-11-22 MED ORDER — DEXTROSE 50 % IV SOLN
INTRAVENOUS | Status: AC
  Administered 2022-11-22: 50 mL
  Filled 2022-11-22: qty 50

## 2022-11-22 MED ORDER — 0.9 % SODIUM CHLORIDE (POUR BTL) OPTIME
TOPICAL | Status: DC | PRN
  Administered 2022-11-22: 2000 mL

## 2022-11-22 MED ORDER — METOPROLOL TARTRATE 5 MG/5ML IV SOLN
2.0000 mg | INTRAVENOUS | Status: DC | PRN
  Administered 2022-11-23: 5 mg via INTRAVENOUS
  Filled 2022-11-22: qty 5

## 2022-11-22 MED ORDER — LABETALOL HCL 5 MG/ML IV SOLN
5.0000 mg | INTRAVENOUS | Status: AC
  Administered 2022-11-22 (×2): 5 mg via INTRAVENOUS

## 2022-11-22 MED ORDER — ACETAMINOPHEN 500 MG PO TABS
1000.0000 mg | ORAL_TABLET | Freq: Four times a day (QID) | ORAL | Status: AC
  Administered 2022-11-23 – 2022-11-25 (×7): 1000 mg via ORAL
  Filled 2022-11-22 (×9): qty 2

## 2022-11-22 MED ORDER — HEPARIN 6000 UNIT IRRIGATION SOLUTION
Status: DC | PRN
  Administered 2022-11-22: 1

## 2022-11-22 MED ORDER — ONDANSETRON HCL 4 MG/2ML IJ SOLN: 4.0000 mg | Freq: Four times a day (QID) | INTRAMUSCULAR | Status: DC | PRN

## 2022-11-22 MED ORDER — MAGNESIUM SULFATE 2 GM/50ML IV SOLN: 2.0000 g | Freq: Every day | INTRAVENOUS | Status: DC | PRN

## 2022-11-22 SURGICAL SUPPLY — 65 items
BAG COUNTER SPONGE SURGICOUNT (BAG) ×1 IMPLANT
BAG ISOLATION DRAPE 18X18 (DRAPES) IMPLANT
BANDAGE ESMARK 6X9 LF (GAUZE/BANDAGES/DRESSINGS) IMPLANT
BNDG ELASTIC 4X5.8 VLCR STR LF (GAUZE/BANDAGES/DRESSINGS) IMPLANT
BNDG ESMARK 6X9 LF (GAUZE/BANDAGES/DRESSINGS) ×1
CANISTER SUCT 3000ML PPV (MISCELLANEOUS) ×1 IMPLANT
CANNULA VESSEL 3MM 2 BLNT TIP (CANNULA) IMPLANT
CATH EMB 2FR 60CM (CATHETERS) IMPLANT
CLIP VESOCCLUDE MED 24/CT (CLIP) ×1 IMPLANT
CLIP VESOCCLUDE SM WIDE 24/CT (CLIP) ×1 IMPLANT
CUFF TOURN SGL QUICK 24 (TOURNIQUET CUFF) ×1
CUFF TOURN SGL QUICK 34 (TOURNIQUET CUFF)
CUFF TOURN SGL QUICK 42 (TOURNIQUET CUFF) IMPLANT
CUFF TRNQT CYL 24X4X16.5-23 (TOURNIQUET CUFF) IMPLANT
CUFF TRNQT CYL 24X4X40X1 (TOURNIQUET CUFF) IMPLANT
CUFF TRNQT CYL 34X4.125X (TOURNIQUET CUFF) IMPLANT
DERMABOND ADVANCED .7 DNX12 (GAUZE/BANDAGES/DRESSINGS) ×1 IMPLANT
DERMABOND ADVANCED .7 DNX6 (GAUZE/BANDAGES/DRESSINGS) IMPLANT
DRAIN CHANNEL 15F RND FF W/TCR (WOUND CARE) IMPLANT
DRAPE HALF SHEET 40X57 (DRAPES) IMPLANT
DRAPE INCISE IOBAN 66X45 STRL (DRAPES) IMPLANT
DRAPE ISOLATION BAG 18X18 (DRAPES) ×1
DRAPE X-RAY CASS 24X20 (DRAPES) IMPLANT
ELECT REM PT RETURN 9FT ADLT (ELECTROSURGICAL) ×1
ELECTRODE REM PT RTRN 9FT ADLT (ELECTROSURGICAL) ×1 IMPLANT
EVACUATOR SILICONE 100CC (DRAIN) IMPLANT
GLOVE SURG SS PI 7.5 STRL IVOR (GLOVE) ×3 IMPLANT
GOWN STRL REUS W/ TWL LRG LVL3 (GOWN DISPOSABLE) ×2 IMPLANT
GOWN STRL REUS W/ TWL XL LVL3 (GOWN DISPOSABLE) ×1 IMPLANT
GOWN STRL REUS W/TWL LRG LVL3 (GOWN DISPOSABLE) ×2
GOWN STRL REUS W/TWL XL LVL3 (GOWN DISPOSABLE) ×1
GRAFT PROPATEN W/RING 6X80X60 (Vascular Products) IMPLANT
HEMOSTAT SNOW SURGICEL 2X4 (HEMOSTASIS) IMPLANT
KIT BASIN OR (CUSTOM PROCEDURE TRAY) ×1 IMPLANT
KIT TURNOVER KIT B (KITS) ×1 IMPLANT
MARKER GRAFT CORONARY BYPASS (MISCELLANEOUS) IMPLANT
NS IRRIG 1000ML POUR BTL (IV SOLUTION) ×2 IMPLANT
PACK PERIPHERAL VASCULAR (CUSTOM PROCEDURE TRAY) ×1 IMPLANT
PAD ARMBOARD 7.5X6 YLW CONV (MISCELLANEOUS) ×2 IMPLANT
PENCIL BUTTON HOLSTER BLD 10FT (ELECTRODE) IMPLANT
SET COLLECT BLD 21X3/4 12 (NEEDLE) IMPLANT
SPONGE T-LAP 18X18 ~~LOC~~+RFID (SPONGE) IMPLANT
STOPCOCK 4 WAY LG BORE MALE ST (IV SETS) IMPLANT
SURGIFLO W/THROMBIN 8M KIT (HEMOSTASIS) IMPLANT
SUT ETHILON 3 0 PS 1 (SUTURE) IMPLANT
SUT MNCRL AB 4-0 PS2 18 (SUTURE) IMPLANT
SUT PROLENE 5 0 C 1 24 (SUTURE) ×1 IMPLANT
SUT PROLENE 6 0 BV (SUTURE) ×1 IMPLANT
SUT PROLENE 7 0 BV 1 (SUTURE) IMPLANT
SUT SILK 2 0 PERMA HAND 18 BK (SUTURE) IMPLANT
SUT SILK 2 0 SH (SUTURE) ×1 IMPLANT
SUT SILK 3 0 (SUTURE)
SUT SILK 3-0 18XBRD TIE 12 (SUTURE) IMPLANT
SUT VIC AB 2-0 CT1 27 (SUTURE) ×3
SUT VIC AB 2-0 CT1 TAPERPNT 27 (SUTURE) ×2 IMPLANT
SUT VIC AB 3-0 SH 27 (SUTURE) ×3
SUT VIC AB 3-0 SH 27X BRD (SUTURE) ×2 IMPLANT
SUT VICRYL 4-0 PS2 18IN ABS (SUTURE) IMPLANT
SYR 3ML LL SCALE MARK (SYRINGE) IMPLANT
TAPE UMBILICAL COTTON 1/8X30 (MISCELLANEOUS) IMPLANT
TOWEL GREEN STERILE (TOWEL DISPOSABLE) ×1 IMPLANT
TRAY FOLEY MTR SLVR 16FR STAT (SET/KITS/TRAYS/PACK) ×1 IMPLANT
TUBING EXTENTION W/L.L. (IV SETS) IMPLANT
UNDERPAD 30X36 HEAVY ABSORB (UNDERPADS AND DIAPERS) ×1 IMPLANT
WATER STERILE IRR 1000ML POUR (IV SOLUTION) ×1 IMPLANT

## 2022-11-22 NOTE — Op Note (Signed)
Patient name: Wendy [name] MRN: [id number] DOB: [birth year] Sex: female  11/22/2022 Pre-operative Diagnosis: Left foot ulcer Post-operative diagnosis:  Same Surgeon:  [name] Assistants:  Melene Muller, MDSamantha Rhyne, PA Procedure:   Left femoral to peroneal artery bypass graft with 6 mm external ring PTFE Anesthesia:  General Blood Loss:  minimal Specimens:  none  Findings: Common femoral endarterectomy was performed.  The patient did not have an adequate saphenous vein.  The peroneal artery was diffusely diseased.  I was able to pass a #2 Fogarty down to the ankle which was consistent with the angiographic findings.  This is a heroic attempt at limb salvage, which I discussed with the family.  They understand that even with revascularization she may require significant amputation.  If this occludes early, no additional attempts for revascularization should be performed.  Indications: This is an 81 year old female who underwent angiography for bilateral ulcers 2 weeks ago.  She has diffuse outflow and runoff disease.  Bypass surgery was recommended the day following her angiogram however the patient declined and wanted to wait until after the holiday.  She came to the emergency department with worsening of her left foot.  I felt that urgent revascularization as an attempt for limb salvage was necessary.  Procedure:  The patient was identified in the holding area and taken to [facility]  The patient was then placed supine on the table. general anesthesia was administered.  The patient was prepped and draped in the usual sterile fashion.  A time out was called and antibiotics were administered.    Dr. Virl Cagey performed exposure of the common femoral artery and the proximal anastomosis.  This was done via an oblique incision after identifying the femoral bifurcation with ultrasound.  Cautery was used to dissect down to the femoral sheath which was opened sharply.  The common femoral,  profundofemoral and superficial femoral artery were exposed.  The artery was heavily calcified.  I made a medial incision on the lower leg in the mid calf region.  Cautery was used divide subcutaneous tissue and fascia.  I reflected the muscle off the posterior side of the tibia and exposed the peroneal artery.  This was diffusely diseased with significant calcification.  It was a small artery measuring 1.5-2 mm.  Next, a long Gore tunneler was used to create a tunnel between the 2 incisions traveling anatomically in a subsartorial plane.  Once the tunnel was created the patient was fully heparinized.  A 6 mm external ring PTFE graft was brought through the tunnel.  A tourniquet was then placed in the upper thigh and the leg was exsanguinated with an Esmarch.  Dr. Virl Cagey performed the proximal anastomosis which was to the common femoral artery at the level of the bifurcation.  A common femoral endarterectomy was performed.  The anastomosis was done with a running 5-0 Prolene.  I opened the peroneal artery with a #11 blade and extended it with Potts scissors.  There was a lumen but no significant bleeding.  I passed a #2 Fogarty down to the ankle.  This went with minimal resistance.  The leg was then straightened and the graft cut the appropriate length and beveled to fit the size the arteriotomy.  A running anastomosis was created with 6-0 Prolene.  Prior to completion the tourniquet was let down and the appropriate flushing maneuvers were performed.  Both anastomoses were then completed.  The patient had a brisk Doppler signal distal to the anastomosis  and at the ankle.  I then began reversing the heparin with protamine.  25 mg of protamine went and and I had trouble finding a Doppler signal.  We waited a few minutes and then I was able to get a brisk peroneal signal just distal to the bypass and down around the ankle.  I did not feel there were any additional things that could be done for this bypass and so I  elected to stop.  The wounds were irrigated.  Surgiflo was used to facilitate hemostasis.  The groin was closed by reapproximating the femoral sheath with 2-0 Vicryl.  The subcutaneous tissue was closed with 3-0 Vicryl followed by subcuticular closure.  The below-knee incision was closed by reapproximate the fascia with 2-0 Vicryl, the subcutaneous tissue with 3-0 Vicryl, and 4-0 Vicryl for subcuticular closure.  Dermabond was applied.  Patient tolerated the procedure well successfully extubated taken recovery in stable condition.   Disposition: To PACU stable.   Theotis Burrow, M.D., [facility] [place] Office: [phone] Pager:  [phone]

## 2022-11-22 NOTE — Transfer of Care (Signed)
Immediate Anesthesia Transfer of Care Note  Patient: [name]  Procedure(s) Performed: LEFT COMMON FEMORAL-PERONEAL ARTERY BYPASS (Left)  Patient Location: PACU  Anesthesia Type:General  Level of Consciousness: awake, alert , and oriented  Airway & Oxygen Therapy: Patient Spontanous Breathing and Patient connected to nasal cannula oxygen  Post-op Assessment: Report given to RN and Post -op Vital signs reviewed and stable  Post vital signs: Reviewed and stable  Last Vitals:  Vitals Value Taken Time  BP 177/79 11/22/22 1505  Temp    Pulse 66 11/22/22 1510  Resp 16 11/22/22 1510  SpO2 100 % 11/22/22 1510  Vitals shown include unvalidated device data.  Last Pain:  Vitals:   11/22/22 1110  TempSrc:   PainSc: 5          Complications: No notable events documented.

## 2022-11-22 NOTE — Discharge Instructions (Signed)
 [facility] [place]  Discharge instructions  Lower Extremity Bypass Surgery  Please refer to the following instruction for your post-procedure care. Your surgeon or physician assistant will discuss any changes with you.  Activity  You are encouraged to walk as much as you can. You can slowly return to normal activities during the month after your surgery. Avoid strenuous activity and heavy lifting until your doctor tells you it's OK. Avoid activities such as vacuuming or swinging a golf club. Do not drive until your doctor give the OK and you are no longer taking prescription pain medications. It is also normal to have difficulty with sleep habits, eating and bowel movement after surgery. These will go away with time.  Bathing/Showering  Shower daily after you go home. Do not soak in a bathtub, hot tub, or swim until the incision heals completely.  Incision Care  Clean your incision with mild soap and water. Shower every day. Pat the area dry with a clean towel. You do not need a bandage unless otherwise instructed. Do not apply any ointments or creams to your incision. If you have open wounds you will be instructed how to care for them or a visiting nurse may be arranged for you. If you have staples or sutures along your incision they will be removed at your post-op appointment. You may have skin glue on your incision. Do not peel it off. It will come off on its own in about one week.  Wash the groin wound with soap and water daily and pat dry. (No tub bath-only shower)  Then put a dry gauze or washcloth in the groin to keep this area dry to help prevent wound infection.  Do this daily and as needed.  Do not use Vaseline or neosporin on your incisions.  Only use soap and water on your incisions and then protect and keep dry.  Diet  Resume your normal diet. There are no special food restrictions following this procedure. A low fat/ low cholesterol diet is  recommended for all patients with vascular disease. In order to heal from your surgery, it is CRITICAL to get adequate nutrition. Your body requires vitamins, minerals, and protein. Vegetables are the best source of vitamins and minerals. Vegetables also provide the perfect balance of protein. Processed food has little nutritional value, so try to avoid this.  Medications  Resume taking all your medications unless your doctor or physician assistant tells you not to. If your incision is causing pain, you may take over-the-counter pain relievers such as acetaminophen (Tylenol). If you were prescribed a stronger pain medication, please aware these medication can cause nausea and constipation. Prevent nausea by taking the medication with a snack or meal. Avoid constipation by drinking plenty of fluids and eating foods with high amount of fiber, such as fruits, vegetables, and grains. Take Colace 100 mg (an over-the-counter stool softener) twice a day as needed for constipation.  Do not take Tylenol if you are taking prescription pain medications.  Follow Up  Our office will schedule a follow up appointment 2-3 weeks following discharge.  Please call us immediately for any of the following conditions  Severe or worsening pain in your legs or feet while at rest or while walking Increase pain, redness, warmth, or drainage (pus) from your incision site(s) Fever of 101 degree or higher The swelling in your leg with the bypass suddenly worsens and becomes more painful than when you were in the hospital If you have   been instructed to feel your graft pulse then you should do so every day. If you can no longer feel this pulse, call the office immediately. Not all patients are given this instruction.  Leg swelling is common after leg bypass surgery.  The swelling should improve over a few months following surgery. To improve the swelling, you may elevate your legs above the level of your heart while you are  sitting or resting. Your surgeon or physician assistant may ask you to apply an ACE wrap or wear compression (TED) stockings to help to reduce swelling.  Reduce your risk of vascular disease  Stop smoking. If you would like help call [facility] at 1-800-QUIT-NOW (1-800-784-8669) or [place] at 336-586-4000.  Manage your cholesterol Maintain a desired weight Control your diabetes weight Control your diabetes Keep your blood pressure down  If you have any questions, please call the office at [phone]  

## 2022-11-22 NOTE — Anesthesia Procedure Notes (Signed)
Procedure Name: Intubation Date/Time: 11/22/2022 12:25 PM  Performed by: Griffin Dakin, CRNAPre-anesthesia Checklist: Patient identified, Emergency Drugs available, Suction available and Patient being monitored Patient Re-evaluated:Patient Re-evaluated prior to induction Oxygen Delivery Method: Circle system utilized Preoxygenation: Pre-oxygenation with 100% oxygen Induction Type: IV induction Ventilation: Mask ventilation without difficulty Laryngoscope Size: Miller and 3 Grade View: Grade I Tube type: Oral Tube size: 7.0 mm Number of attempts: 1 Airway Equipment and Method: Stylet and Oral airway Placement Confirmation: ETT inserted through vocal cords under direct vision, positive ETCO2 and breath sounds checked- equal and bilateral Secured at: 21 cm Tube secured with: Tape Dental Injury: Teeth and Oropharynx as per pre-operative assessment

## 2022-11-22 NOTE — Progress Notes (Signed)
Patient received from ED via stretcher, AO x4. Heparin Gtts runing at 8.89m/hr. CHG bath completed, connected to tele and CCMD notified. Oriented pt to room and call bell system . Pt's son by bedside. Call bell within reach. Plan of care continues.

## 2022-11-22 NOTE — Progress Notes (Addendum)
FMTS Brief Progress Note  S: Night rounds. Post-op. Daughter at bedside and saying patient has been experiencing excruciating pain, mainly in her left toes and heel. No pain at bypass site. Daughter reports patient has been in so much pain she has been crying out and tearing up.   O: BP 117/88   Pulse 71   Temp 97.8 F (36.6 C) (Axillary)   Resp 15   Ht '5\' 4"'$  (1.626 m)   Wt 54.4 kg   SpO2 100%   BMI 20.59 kg/m   Gen: awake, alert, NAD, eating dinner from bedside tray across her lap Resp: speaking clearly, unlabored respirations Ext: left toes with worsened black eschar, left toes no longer beefy red as before surgery, demarcation forming proximal to middle three left toes, heel without visible wound, no palpable dorsal pedal or pretibial pulses, left leg warm to touch down to 2-3" proximal to ankle where woody skin changes first appear  A/P: PVD  critical limb ischemia of left leg Post-op this afternoon. Terrible pain previously, likely ischemic pain only felt now due to increased blood flow from bypass. Appears to be improved with pain meds. Discussed this with patient, that this is a good sign in terms of blood flow. Will pursue aggressive pain management (as able, due to age and underlying mild dementia) as ischemic pain is undoubtedly excruciating. - scheduled tylenol 1,000 mg Q6 x72 hours - scheduled gabapentin 200 mg TID including now  - Q4 PRN oxy IR 5-10 mg for moderate pain  - Q3 PRN dilaudid 0.5-1 mg for severe pain - delirium precautions - fall precautions    Ezequiel Essex, MD 11/22/2022, 8:47 PM PGY-3, [facility] Night Resident  Please page [phone] with questions.

## 2022-11-22 NOTE — Plan of Care (Signed)

## 2022-11-22 NOTE — Consult Note (Signed)
   Podiatry consult received from vascular for worsening extensive left foot tissue loss. S/p left leg femoral to peroneal artery bypass this afternoon, 11/22/2022.  Will plan to see tomorrow afternoon, 11/23/2022, to consult and discuss likely surgical management of the foot wounds.  Edrick Kins, DPM [facility]  Dr. Edrick Kins, DPM    [address], [place] [address]                Office [phone]  Fax [phone]

## 2022-11-22 NOTE — Evaluation (Signed)
Physical Therapy Evaluation Patient Details Name: Wendy [name] MRN: [id number] DOB: 14-Jan-1941 Today's Date: 11/22/2022  History of Present Illness  Pt is an 81 y/o F admitted on 11/20/22 after presenting with c/o increased BLE pain & worsening wounds on L foot. Pt recently underwent LLE CO2 angiogram on 11/12/22 & plan was for L common femoral to peroneal artery bypass on 12/25/22. Vascular was consulted & pt found to have dry gangrene on all 5 digits of L foot with dorsal blistering & evidence of skin infection. Pt is now scheduled for L common femoral to peroneal artery bypass on 11/22/22. PMH: DM2, essential HTN, PVD, CKD Stage 3, RA, IBS  Clinical Impression  Pt seen for PT evaluation with pt received in bed. Pt tearful, anxious, and emotional re: situation but doesn't appear to have good awareness of plan as pt asking "what are they going to do today?". PT dons socks total assist 2/2 pt request but pt able to transfer supine>sit with supervision & extra time. Encouraged pt to attempt STS & transfer to recliner but pt unable to initiate STS 2/2 pain. Pt's son arrived & reports pt is unable to do anything at this time & is unsure why PT is present. Attempted to explain purpose of PT intervention at this time but pt/family would benefit from further education. PT attempted to assist pt back in bed but son electing to help.     Recommendations for follow up therapy are one component of a multi-disciplinary discharge planning process, led by the attending physician.  Recommendations may be updated based on patient status, additional functional criteria and insurance authorization.  Follow Up Recommendations Skilled nursing-short term rehab (<3 hours/day) Can patient physically be transported by private vehicle: No    Assistance Recommended at Discharge Frequent or constant Supervision/Assistance  Patient can return home with the following  A lot of help with walking and/or transfers;A lot of  help with bathing/dressing/bathroom;Assist for transportation;Assistance with cooking/housework;Help with stairs or ramp for entrance    Equipment Recommendations None recommended by PT (TBD in next venue)  Recommendations for Other Services       Functional Status Assessment Patient has had a recent decline in their functional status and demonstrates the ability to make significant improvements in function in a reasonable and predictable amount of time.     Precautions / Restrictions Precautions Precautions: Fall Restrictions Weight Bearing Restrictions: No      Mobility  Bed Mobility Overal bed mobility: Needs Assistance Bed Mobility: Supine to Sit     Supine to sit: Supervision, HOB elevated     General bed mobility comments: Pt is able to transfer supine>sit with HOB slightly elevated & extra time.    Transfers                        Ambulation/Gait                  Stairs            Wheelchair Mobility    Modified Rankin (Stroke Patients Only)       Balance Overall balance assessment: Needs assistance Sitting-balance support: Feet supported, Bilateral upper extremity supported Sitting balance-Leahy Scale: Fair Sitting balance - Comments: supervision static sitting                                     Pertinent Vitals/Pain Pain Assessment  Pain Assessment: Faces Faces Pain Scale: Hurts whole lot Pain Location: LLE Pain Descriptors / Indicators: Discomfort, Crying, Grimacing Pain Intervention(s): Monitored during session, Patient requesting pain meds-RN notified    Home Living Family/patient expects to be discharged to:: Private residence Living Arrangements: Alone Available Help at Discharge: Family               Additional Comments: Pt & son did not provide entirity of home set up information. Pt lives in house with stairs to negotiate at some point (either inside or outside). Son lives in trailer behind pt  but has been staying with pt to assist. Pt reports son picks her up around trunk to assist PRN.    Prior Function               Mobility Comments: Unsure, pt reports not being very mobile lately with son providing significant physical assistance.       Hand Dominance        Extremity/Trunk Assessment   Upper Extremity Assessment Upper Extremity Assessment: Generalized weakness    Lower Extremity Assessment Lower Extremity Assessment: Generalized weakness (difficult to assess 2/2 pt's decreased willingness)       Communication   Communication: No difficulties  Cognition Arousal/Alertness: Awake/alert Behavior During Therapy: Anxious Overall Cognitive Status: Difficult to assess                                 General Comments: Pt anxious, tearful & emotional throughout session. Pt asking PT what kind of procecure she's having today & what they plan on doing. Pt & son demonstrate decreased willingness re: PT intervention & purpose of therapy.        General Comments      Exercises     Assessment/Plan    PT Assessment Patient needs continued PT services  PT Problem List Decreased strength;Pain;Decreased range of motion;Decreased activity tolerance;Decreased balance;Decreased mobility;Decreased skin integrity;Decreased knowledge of use of DME;Decreased safety awareness;Decreased knowledge of precautions;Decreased cognition       PT Treatment Interventions DME instruction;Therapeutic exercise;Gait training;Balance training;Stair training;Neuromuscular re-education;Cognitive remediation;Functional mobility training;Patient/family education;Therapeutic activities;Modalities    PT Goals (Current goals can be found in the Care Plan section)  Acute Rehab PT Goals Patient Stated Goal: decreased pain PT Goal Formulation: With patient Time For Goal Achievement: 12/06/22 Potential to Achieve Goals: Fair    Frequency Min 2X/week     Co-evaluation                AM-PAC PT "6 Clicks" Mobility  Outcome Measure Help needed turning from your back to your side while in a flat bed without using bedrails?: A Little Help needed moving from lying on your back to sitting on the side of a flat bed without using bedrails?: A Little Help needed moving to and from a bed to a chair (including a wheelchair)?: Total Help needed standing up from a chair using your arms (e.g., wheelchair or bedside chair)?: Total Help needed to walk in hospital room?: Total Help needed climbing 3-5 steps with a railing? : Total 6 Click Score: 10    End of Session Equipment Utilized During Treatment: Gait belt Activity Tolerance: Patient limited by pain Patient left:  (sitting EOB with son & MD in room) Nurse Communication: Mobility status PT Visit Diagnosis: Pain;Difficulty in walking, not elsewhere classified (R26.2);Muscle weakness (generalized) (M62.81) Pain - Right/Left: Left Pain - part of body: Ankle and joints of foot  Time: 7276-1848 PT Time Calculation (min) (ACUTE ONLY): 20 min   Charges:   PT Evaluation $PT Eval Moderate Complexity: Doctor Phillips, PT, DPT 11/22/22, 10:00 AM   Waunita Schooner 11/22/2022, 9:57 AM

## 2022-11-22 NOTE — Anesthesia Procedure Notes (Addendum)
Arterial Line Insertion Start/End12/28/2023 12:40 PM, 11/22/2022 12:40 PM Performed by: Nolon Nations, MD, anesthesiologist  Patient location: OR. Preanesthetic checklist: patient identified, IV checked, site marked, risks and benefits discussed, surgical consent, monitors and equipment checked, pre-op evaluation, timeout performed and anesthesia consent Right, radial was placed Catheter size: 20 G Hand hygiene performed , maximum sterile barriers used  and Seldinger technique used Allen's test indicative of satisfactory collateral circulation Attempts: 2 Procedure performed using ultrasound guided technique. Ultrasound Notes:anatomy identified, needle tip was noted to be adjacent to the nerve/plexus identified, no ultrasound evidence of intravascular and/or intraneural injection and image(s) printed for medical record Following insertion, dressing applied and Biopatch. Post procedure complications: second provider assisted and unsuccessful attempts. Patient tolerated the procedure well with no immediate complications.

## 2022-11-22 NOTE — Anesthesia Postprocedure Evaluation (Signed)
Anesthesia Post Note  Patient: [name]  Procedure(s) Performed: LEFT COMMON FEMORAL-PERONEAL ARTERY BYPASS (Left)     Patient location during evaluation: PACU Anesthesia Type: General Level of consciousness: awake and alert Pain management: pain level controlled Vital Signs Assessment: post-procedure vital signs reviewed and stable Respiratory status: spontaneous breathing, nonlabored ventilation, respiratory function stable and patient connected to nasal cannula oxygen Cardiovascular status: blood pressure returned to baseline and stable Postop Assessment: no apparent nausea or vomiting Anesthetic complications: no   No notable events documented.  Last Vitals:  Vitals:   11/22/22 1630 11/22/22 1650  BP: (!) 161/78 (!) 174/81  Pulse: 74 71  Resp: 19 15  Temp:  36.6 C  SpO2: 100% 100%    Last Pain:  Vitals:   11/22/22 1650  TempSrc: Axillary  PainSc: Asleep                 Effie Berkshire

## 2022-11-22 NOTE — Progress Notes (Signed)
  Daily Progress Note  Dx: LLE critical limb ischemia with tissue loss of the forefoot and toes.   Subjective: No complaints this morning. Understands the need for a bypass. Appreciative of care.   Objective: Vitals:   11/22/22 0800 11/22/22 1049  BP: 133/71 (!) 136/90  Pulse: 69 74  Resp: 14 18  Temp: 98.1 F (36.7 C) 98.1 F (36.7 C)  SpO2: 99% 100%    Physical Examination Toe with open wounds extending onto forefoot - wrapped.  No pulse in the foot.  Nonlabored breathing    ASSESSMENT/PLAN:  Pt known to our service. Presented with extensive left foot tissue loss.  After discussing the risks of high-risk left leg femoral to peroneal artery bypass in an effort to improve perfusion for wound healing, Wendy [name] elected to proceed.    Cassandria Santee MD MS [facility] [phone] 11/22/2022  12:02 PM

## 2022-11-22 NOTE — Progress Notes (Signed)
Blood bank called stating antibody screen was positive and they only had two units available for transfusion if needed, questioning if that would be enough for today's procedure. Notified anesthesia (Dr. Smith Robert) and he stated that was enough for today. Made blood bank aware of conversation with anesthesia.

## 2022-11-22 NOTE — Progress Notes (Signed)
ANTICOAGULATION CONSULT NOTE - Follow-Up Consult  Pharmacy Consult for heparin Indication: Ischemic limb  Allergies  Allergen Reactions   Codeine    Erythromycin    Leflunomide Other (See Comments)   Lisinopril    Naproxen    Penicillins     Filled Augmentin 09/2022   Pioglitazone    Valsartan Other (See Comments)    Patient Measurements:   Heparin Dosing Weight: TBW  Vital Signs: Temp: 98.1 F (36.7 C) (12/28 0800) Temp Source: Oral (12/28 0800) BP: 133/71 (12/28 0800) Pulse Rate: 69 (12/28 0800)  Labs: Recent Labs    11/20/22 1638 11/22/22 0628  HGB 9.2* 8.8*  HCT 31.8* 29.2*  PLT 328 352  APTT  --  124*  LABPROT  --  14.7  INR  --  1.2  HEPARINUNFRC  --  0.45  CREATININE 1.76* 1.72*     Estimated Creatinine Clearance: 21.7 mL/min (A) (by C-G formula based on SCr of 1.72 mg/dL (H)).   Medical History: Past Medical History:  Diagnosis Date   Acquired hammer toes of both feet    Anemia    Anxiety    Arthritis    Chronic kidney disease    Diabetes mellitus without complication (HCC)    Eczema    Environmental allergies    Fibrocystic breast changes    GERD (gastroesophageal reflux disease)    Hearing loss    Hypercholesteremia    Hypertension    IBS (irritable bowel syndrome)    Incontinence    Memory loss    Osteoarthritis    PVD (peripheral vascular disease) (HCC)    Rheumatoid arthritis (HCC)     Assessment: 81 yo F presenting with ischemic limb, she is not on anticoagulation PTA.  Patient started on heparin infusion.  Heparin level therapeutic.  Noted plans for OR today.   Goal of Therapy:  Heparin level 0.3-0.7 units/ml Monitor platelets by anticoagulation protocol: Yes   Plan:  Continue heparin infusion at 850 units/hr Follow-up after OR for anticoagulation plans.   [employer], Pharm.D., BCPS Clinical Pharmacist Clinical phone for 11/22/2022 from 7:30-3:00 is [phone].  **Pharmacist phone directory can be found on  [email or web address] listed under [place].  11/22/2022 8:59 AM

## 2022-11-22 NOTE — Progress Notes (Signed)
     Daily Progress Note Intern Pager: [phone]  Patient name: Wendy [name] Medical record number: [id number] Date of birth: [birth year] Age: 81 y.o. Gender: female  Primary Care Provider: Default, Provider, MD Consultants: Vascular Code Status: Full code  Pt Overview and Major Events to Date:  12/27-admitted  Assessment and Plan:  Wendy [name] is a 81 y.o. female presenting with bilateral leg pain 2/2 worsening chronic critical limb ischemia in the setting of uncontrolled diabetes and PVD.  PMH includes HTN, CKD, T2DM, Sjogren syndrome, anemia, GERD, HLD  * Critical limb ischemia of both lower extremities (HCC) Scheduled for surgery today.  N.p.o. since midnight.  Negative MRSA, positive Staph aureus -Follow-up vascular surgery recommendations, appreciate their care -Antibiotics per vascular team, cefepime and vancomycin ordered -Heparin per pharmacy -Neurovascular checks q4h -Pain management: Tylenol '650mg'$  q6h, Oxycodone 2.5-'5mg'$  prn q4h -Bowel regimen: Miralax and Senna daily -PT/OT for post-op mobilization  Essential hypertension Hypertensive to 180s/80s. On Clonidine, Irebsartan and Metoprolol at home, unclear compliance. -Irbesartan held in the setting of hyperkalemia -Continue home Metoprolol '25mg'$  daily  Type 2 diabetes mellitus with kidney complication, without long-term current use of insulin (HCC) A1c 10.4 in 12/2020, repeat in process. On Metformin, Pioglitazone and Glimepiride at home, likely non-compliant with reported sugars in 300s the past few weeks.  Hypoglycemic to 58 this morning as she received SSI despite being NPO. This was treated w/ D50, will likely restart diet after procedure. If not, may start D5NS. -CBGs + sSSI -Follow-up A1c       FEN/GI: N.p.o. for procedure PPx: likely lovenox after surgery Dispo: Pending further management    Subjective:  In pain this morning, struggled to participate with PT. Son at bedside and is appropriately anxious  about his mother's health and pain. Discussed plan for upcoming procedure and subsequent monitoring/PT. No further concerns. Denies CP/SOB  Objective: Temp:  [98 F (36.7 C)-98.1 F (36.7 C)] 98.1 F (36.7 C) (12/28 1049) Pulse Rate:  [55-77] 74 (12/28 1049) Resp:  [14-19] 18 (12/28 1049) BP: (133-170)/(63-90) 136/90 (12/28 1049) SpO2:  [30 %-100 %] 100 % (12/28 1049) Weight:  [54.4 kg] 54.4 kg (12/28 1049) Physical Exam: General: appears uncomfortable, appropriately responsive Cardiovascular: RRR Respiratory: CTAB normal WOB on RA Abdomen: soft NT/ND Extremities: b/l foot wounds stable from prior  Laboratory: Most recent CBC Lab Results  Component Value Date   WBC 10.3 11/22/2022   HGB 8.8 (L) 11/22/2022   HCT 29.2 (L) 11/22/2022   MCV 76.6 (L) 11/22/2022   PLT 352 11/22/2022   Most recent BMP    Latest Ref Rng & Units 11/22/2022    6:28 AM  BMP  Glucose 70 - 99 mg/dL 196   BUN 8 - 23 mg/dL 16   Creatinine 0.44 - 1.00 mg/dL 1.72   Sodium 135 - 145 mmol/L 137   Potassium 3.5 - 5.1 mmol/L 5.1   Chloride 98 - 111 mmol/L 109   CO2 22 - 32 mmol/L 21   Calcium 8.9 - 10.3 mg/dL 8.9    APTT 124 Albumin 2.3 Staph aureus positive, MRSA negative UA with proteinuria, leukocytes, bacteria  Imaging/Diagnostic Tests:  CXR IMPRESSION: No acute cardiopulmonary process.  August Albino, MD 11/22/2022, 11:41 AM  PGY-1, [place] Intern pager: [phone], text pages welcome Secure chat group [place]

## 2022-11-22 NOTE — Anesthesia Preprocedure Evaluation (Addendum)
Anesthesia Evaluation  Patient identified by MRN, date of birth, ID band Patient awake    Reviewed: Allergy & Precautions, NPO status , Patient's Chart, lab work & pertinent test results, reviewed documented beta blocker date and time   Airway Mallampati: I  TM Distance: <3 FB Neck ROM: Full    Dental  (+) Edentulous Upper, Edentulous Lower   Pulmonary neg pulmonary ROS    + decreased breath sounds      Cardiovascular hypertension, Pt. on home beta blockers and Pt. on medications + Peripheral Vascular Disease   Rhythm:Regular Rate:Normal     Neuro/Psych   Anxiety     negative neurological ROS     GI/Hepatic Neg liver ROS,GERD  Medicated,,  Endo/Other  diabetes, Type 2, Oral Hypoglycemic Agents    Renal/GU Renal disease     Musculoskeletal  (+) Arthritis , Rheumatoid disorders,    Abdominal   Peds  Hematology   Anesthesia Other Findings   Reproductive/Obstetrics                             Anesthesia Physical Anesthesia Plan  ASA: 3  Anesthesia Plan: General   Post-op Pain Management: Ofirmev IV (intra-op)*   Induction: Intravenous  PONV Risk Score and Plan:   Airway Management Planned: Oral ETT  Additional Equipment: Arterial line  Intra-op Plan:   Post-operative Plan: Extubation in OR  Informed Consent: I have reviewed the patients History and Physical, chart, labs and discussed the procedure including the risks, benefits and alternatives for the proposed anesthesia with the patient or authorized representative who has indicated his/her understanding and acceptance.     Dental advisory given  Plan Discussed with: CRNA  Anesthesia Plan Comments:        Anesthesia Quick Evaluation

## 2022-11-22 NOTE — Progress Notes (Signed)
Hypoglycemic Event  CBG: 58  Treatment: D50 50 mL (25 gm)  Symptoms: None  Follow-up CBG: Time: glucose to be rechecked in short stay  CBG Result:  Possible Reasons for Event: Inadequate meal intake  Comments/MD notified:    [place]

## 2022-11-23 ENCOUNTER — Encounter (HOSPITAL_COMMUNITY): Payer: Self-pay | Admitting: Student

## 2022-11-23 ENCOUNTER — Inpatient Hospital Stay (HOSPITAL_COMMUNITY): Payer: Medicare PPO | Admitting: Anesthesiology

## 2022-11-23 ENCOUNTER — Inpatient Hospital Stay (HOSPITAL_COMMUNITY): Payer: Medicare PPO

## 2022-11-23 ENCOUNTER — Encounter (HOSPITAL_COMMUNITY)
Admission: EM | Disposition: A | Discharge status: Skilled Nursing Facility | Payer: Self-pay | Source: Home / Self Care | Attending: Family Medicine

## 2022-11-23 DIAGNOSIS — Z7984 Long term (current) use of oral hypoglycemic drugs: Secondary | ICD-10-CM

## 2022-11-23 DIAGNOSIS — E875 Hyperkalemia: Secondary | ICD-10-CM

## 2022-11-23 DIAGNOSIS — I10 Essential (primary) hypertension: Secondary | ICD-10-CM | POA: Diagnosis not present

## 2022-11-23 DIAGNOSIS — E1151 Type 2 diabetes mellitus with diabetic peripheral angiopathy without gangrene: Secondary | ICD-10-CM

## 2022-11-23 DIAGNOSIS — T82868A Thrombosis of vascular prosthetic devices, implants and grafts, initial encounter: Secondary | ICD-10-CM | POA: Diagnosis not present

## 2022-11-23 DIAGNOSIS — I998 Other disorder of circulatory system: Secondary | ICD-10-CM | POA: Diagnosis not present

## 2022-11-23 DIAGNOSIS — I70223 Atherosclerosis of native arteries of extremities with rest pain, bilateral legs: Secondary | ICD-10-CM | POA: Diagnosis not present

## 2022-11-23 DIAGNOSIS — M79605 Pain in left leg: Secondary | ICD-10-CM | POA: Diagnosis not present

## 2022-11-23 HISTORY — PX: THROMBECTOMY FEMORAL ARTERY: SHX6406

## 2022-11-23 LAB — BASIC METABOLIC PANEL
Anion gap: 6 (ref 5–15)
Anion gap: 7 (ref 5–15)
BUN: 14 mg/dL (ref 8–23)
BUN: 13 mg/dL (ref 8–23)
CO2: 21 mmol/L — ABNORMAL LOW (ref 22–32)
CO2: 21 mmol/L — ABNORMAL LOW (ref 22–32)
Calcium: 8.4 mg/dL — ABNORMAL LOW (ref 8.9–10.3)
Calcium: 8.5 mg/dL — ABNORMAL LOW (ref 8.9–10.3)
Chloride: 111 mmol/L (ref 98–111)
Chloride: 111 mmol/L (ref 98–111)
Creatinine, Ser: 1.57 mg/dL — ABNORMAL HIGH (ref 0.44–1.00)
Creatinine, Ser: 1.42 mg/dL — ABNORMAL HIGH (ref 0.44–1.00)
GFR, Estimated: 33 mL/min — ABNORMAL LOW (ref >60)
GFR, Estimated: 37 mL/min — ABNORMAL LOW (ref >60)
Glucose, Bld: 262 mg/dL — ABNORMAL HIGH (ref 70–99)
Glucose, Bld: 210 mg/dL — ABNORMAL HIGH (ref 70–99)
Potassium: 5.7 mmol/L — ABNORMAL HIGH (ref 3.5–5.1)
Potassium: 5.2 mmol/L — ABNORMAL HIGH (ref 3.5–5.1)
Sodium: 138 mmol/L (ref 135–145)
Sodium: 139 mmol/L (ref 135–145)

## 2022-11-23 LAB — MAGNESIUM: Magnesium: 1.5 mg/dL — ABNORMAL LOW (ref 1.7–2.4)

## 2022-11-23 LAB — GLUCOSE, CAPILLARY
Glucose-Capillary: 204 mg/dL — ABNORMAL HIGH (ref 70–99)
Glucose-Capillary: 204 mg/dL — ABNORMAL HIGH (ref 70–99)
Glucose-Capillary: 200 mg/dL — ABNORMAL HIGH (ref 70–99)
Glucose-Capillary: 251 mg/dL — ABNORMAL HIGH (ref 70–99)
Glucose-Capillary: 162 mg/dL — ABNORMAL HIGH (ref 70–99)

## 2022-11-23 LAB — CBC
HCT: 27.1 % — ABNORMAL LOW (ref 36.0–46.0)
Hemoglobin: 8.1 g/dL — ABNORMAL LOW (ref 12.0–15.0)
MCH: 22.7 pg — ABNORMAL LOW (ref 26.0–34.0)
MCHC: 29.9 g/dL — ABNORMAL LOW (ref 30.0–36.0)
MCV: 75.9 fL — ABNORMAL LOW (ref 80.0–100.0)
Platelets: 345 10*3/uL (ref 150–400)
RBC: 3.57 MIL/uL — ABNORMAL LOW (ref 3.87–5.11)
RDW: 14.9 % (ref 11.5–15.5)
WBC: 11.6 10*3/uL — ABNORMAL HIGH (ref 4.0–10.5)
nRBC: 0 % (ref 0.0–0.2)

## 2022-11-23 LAB — HEPARIN LEVEL (UNFRACTIONATED): Heparin Unfractionated: 0.36 IU/mL (ref 0.30–0.70)

## 2022-11-23 LAB — HEMOGLOBIN A1C
Hgb A1c MFr Bld: 11.2 % — ABNORMAL HIGH (ref 4.8–5.6)
Mean Plasma Glucose: 275 mg/dL

## 2022-11-23 LAB — LIPID PANEL
Cholesterol: 151 mg/dL (ref 0–200)
HDL: 37 mg/dL — ABNORMAL LOW (ref >40)
LDL Cholesterol: 103 mg/dL — ABNORMAL HIGH (ref 0–99)
Total CHOL/HDL Ratio: 4.1 RATIO
Triglycerides: 57 mg/dL (ref <150)
VLDL: 11 mg/dL (ref 0–40)

## 2022-11-23 LAB — APTT: aPTT: 67 seconds — ABNORMAL HIGH (ref 24–36)

## 2022-11-23 SURGERY — THROMBECTOMY, ARTERY, FEMORAL
Anesthesia: General | Site: Leg Lower | Laterality: Left

## 2022-11-23 MED ORDER — METOPROLOL TARTRATE 5 MG/5ML IV SOLN
INTRAVENOUS | Status: AC
  Administered 2022-11-23: 5 mg via INTRAVENOUS
  Filled 2022-11-23: qty 5

## 2022-11-23 MED ORDER — OXYCODONE HCL 5 MG PO TABS
2.5000 mg | ORAL_TABLET | ORAL | Status: DC | PRN
  Administered 2022-11-24: 2.5 mg via ORAL
  Filled 2022-11-23: qty 1

## 2022-11-23 MED ORDER — METOPROLOL TARTRATE 5 MG/5ML IV SOLN: 5.0000 mg | Freq: Once | INTRAVENOUS | Status: AC

## 2022-11-23 MED ORDER — ROCURONIUM BROMIDE 10 MG/ML (PF) SYRINGE
PREFILLED_SYRINGE | INTRAVENOUS | Status: DC | PRN
  Administered 2022-11-23: 60 mg via INTRAVENOUS

## 2022-11-23 MED ORDER — HEPARIN (PORCINE) 25000 UT/250ML-% IV SOLN: 1000.0000 [IU]/h | INTRAVENOUS | Status: DC

## 2022-11-23 MED ORDER — MAGNESIUM SULFATE 2 GM/50ML IV SOLN
2.0000 g | Freq: Once | INTRAVENOUS | Status: AC
  Administered 2022-11-23: 2 g via INTRAVENOUS
  Filled 2022-11-23 (×2): qty 50

## 2022-11-23 MED ORDER — PHENYLEPHRINE HCL-NACL 20-0.9 MG/250ML-% IV SOLN
INTRAVENOUS | Status: DC | PRN
  Administered 2022-11-23: 40 ug/min via INTRAVENOUS

## 2022-11-23 MED ORDER — ORAL CARE MOUTH RINSE: 15.0000 mL | Freq: Once | OROMUCOSAL | Status: DC

## 2022-11-23 MED ORDER — HEPARIN 6000 UNIT IRRIGATION SOLUTION
Status: AC
  Filled 2022-11-23: qty 500

## 2022-11-23 MED ORDER — HEPARIN SODIUM (PORCINE) 1000 UNIT/ML IJ SOLN
INTRAMUSCULAR | Status: DC | PRN
  Administered 2022-11-23: 6000 [IU] via INTRAVENOUS

## 2022-11-23 MED ORDER — PROPOFOL 10 MG/ML IV BOLUS
INTRAVENOUS | Status: AC
  Filled 2022-11-23: qty 20

## 2022-11-23 MED ORDER — CHLORHEXIDINE GLUCONATE 0.12 % MT SOLN: 15.0000 mL | Freq: Once | OROMUCOSAL | Status: DC

## 2022-11-23 MED ORDER — 0.9 % SODIUM CHLORIDE (POUR BTL) OPTIME
TOPICAL | Status: DC | PRN
  Administered 2022-11-23: 1000 mL

## 2022-11-23 MED ORDER — ONDANSETRON HCL 4 MG/2ML IJ SOLN
INTRAMUSCULAR | Status: DC | PRN
  Administered 2022-11-23: 4 mg via INTRAVENOUS

## 2022-11-23 MED ORDER — PROMETHAZINE HCL 25 MG/ML IJ SOLN: 6.2500 mg | INTRAMUSCULAR | Status: DC | PRN

## 2022-11-23 MED ORDER — HYDROMORPHONE HCL 1 MG/ML IJ SOLN
0.5000 mg | INTRAMUSCULAR | Status: DC | PRN
  Administered 2022-11-23 – 2022-11-25 (×5): 0.5 mg via INTRAVENOUS
  Filled 2022-11-23 (×6): qty 0.5

## 2022-11-23 MED ORDER — PHENYLEPHRINE 80 MCG/ML (10ML) SYRINGE FOR IV PUSH (FOR BLOOD PRESSURE SUPPORT)
PREFILLED_SYRINGE | INTRAVENOUS | Status: DC | PRN
  Administered 2022-11-23 (×2): 80 ug via INTRAVENOUS
  Administered 2022-11-23: 240 ug via INTRAVENOUS

## 2022-11-23 MED ORDER — SUGAMMADEX SODIUM 200 MG/2ML IV SOLN
INTRAVENOUS | Status: DC | PRN
  Administered 2022-11-23: 108.8 mg via INTRAVENOUS

## 2022-11-23 MED ORDER — LACTATED RINGERS IV SOLN: INTRAVENOUS | Status: DC

## 2022-11-23 MED ORDER — FENTANYL CITRATE (PF) 100 MCG/2ML IJ SOLN
INTRAMUSCULAR | Status: AC
  Filled 2022-11-23: qty 2

## 2022-11-23 MED ORDER — HEPARIN 6000 UNIT IRRIGATION SOLUTION
Status: DC | PRN
  Administered 2022-11-23: 1

## 2022-11-23 MED ORDER — HEMOSTATIC AGENTS (NO CHARGE) OPTIME
TOPICAL | Status: DC | PRN
  Administered 2022-11-23: 1 via TOPICAL

## 2022-11-23 MED ORDER — SODIUM CHLORIDE 0.9 % IV SOLN: INTRAVENOUS | Status: DC | PRN

## 2022-11-23 MED ORDER — LIDOCAINE 2% (20 MG/ML) 5 ML SYRINGE
INTRAMUSCULAR | Status: DC | PRN
  Administered 2022-11-23: 100 mg via INTRAVENOUS

## 2022-11-23 MED ORDER — ACETAMINOPHEN 500 MG PO TABS: 1000.0000 mg | ORAL_TABLET | Freq: Once | ORAL | Status: DC

## 2022-11-23 MED ORDER — FENTANYL CITRATE (PF) 250 MCG/5ML IJ SOLN
INTRAMUSCULAR | Status: AC
  Filled 2022-11-23: qty 5

## 2022-11-23 MED ORDER — EPHEDRINE SULFATE-NACL 50-0.9 MG/10ML-% IV SOSY
PREFILLED_SYRINGE | INTRAVENOUS | Status: DC | PRN
  Administered 2022-11-23: 5 mg via INTRAVENOUS

## 2022-11-23 MED ORDER — FENTANYL CITRATE (PF) 250 MCG/5ML IJ SOLN
INTRAMUSCULAR | Status: DC | PRN
  Administered 2022-11-23 (×3): 50 ug via INTRAVENOUS

## 2022-11-23 MED ORDER — PROPOFOL 10 MG/ML IV BOLUS
INTRAVENOUS | Status: DC | PRN
  Administered 2022-11-23: 80 mg via INTRAVENOUS

## 2022-11-23 MED ORDER — AMISULPRIDE (ANTIEMETIC) 5 MG/2ML IV SOLN: 10.0000 mg | Freq: Once | INTRAVENOUS | Status: DC | PRN

## 2022-11-23 MED ORDER — CHLORHEXIDINE GLUCONATE 0.12 % MT SOLN
OROMUCOSAL | Status: AC
  Filled 2022-11-23: qty 15

## 2022-11-23 MED ORDER — ALBUMIN HUMAN 25 % IV SOLN: INTRAVENOUS | Status: DC | PRN

## 2022-11-23 MED ORDER — FENTANYL CITRATE (PF) 100 MCG/2ML IJ SOLN
25.0000 ug | INTRAMUSCULAR | Status: DC | PRN
  Administered 2022-11-23: 50 ug via INTRAVENOUS

## 2022-11-23 SURGICAL SUPPLY — 44 items
BAG COUNTER SPONGE SURGICOUNT (BAG) ×1 IMPLANT
CANISTER SUCT 3000ML PPV (MISCELLANEOUS) ×1 IMPLANT
CATH EMB 2FR 60CM (CATHETERS) IMPLANT
CATH EMB 4FR 80CM (CATHETERS) IMPLANT
CATH EMB 6FR 80CM (CATHETERS) IMPLANT
CLIP TI MEDIUM 6 (CLIP) IMPLANT
CLIP TI WIDE RED SMALL 6 (CLIP) IMPLANT
CLIP VESOCCLUDE MED 24/CT (CLIP) ×1 IMPLANT
CLIP VESOCCLUDE SM WIDE 24/CT (CLIP) ×1 IMPLANT
DERMABOND ADVANCED .7 DNX12 (GAUZE/BANDAGES/DRESSINGS) ×1 IMPLANT
DRAIN CHANNEL 15F RND FF W/TCR (WOUND CARE) IMPLANT
DRAPE X-RAY CASS 24X20 (DRAPES) IMPLANT
ELECT REM PT RETURN 9FT ADLT (ELECTROSURGICAL) ×1
ELECTRODE REM PT RTRN 9FT ADLT (ELECTROSURGICAL) ×1 IMPLANT
EVACUATOR SILICONE 100CC (DRAIN) IMPLANT
GLOVE SURG SS PI 7.5 STRL IVOR (GLOVE) ×3 IMPLANT
GOWN STRL REUS W/ TWL LRG LVL3 (GOWN DISPOSABLE) ×2 IMPLANT
GOWN STRL REUS W/ TWL XL LVL3 (GOWN DISPOSABLE) ×1 IMPLANT
GOWN STRL REUS W/TWL LRG LVL3 (GOWN DISPOSABLE) ×2
GOWN STRL REUS W/TWL XL LVL3 (GOWN DISPOSABLE) ×1
HEMOSTAT SNOW SURGICEL 2X4 (HEMOSTASIS) IMPLANT
KIT BASIN OR (CUSTOM PROCEDURE TRAY) ×1 IMPLANT
KIT TURNOVER KIT B (KITS) ×1 IMPLANT
NS IRRIG 1000ML POUR BTL (IV SOLUTION) ×2 IMPLANT
PACK PERIPHERAL VASCULAR (CUSTOM PROCEDURE TRAY) ×1 IMPLANT
PAD ARMBOARD 7.5X6 YLW CONV (MISCELLANEOUS) ×2 IMPLANT
SET COLLECT BLD 21X3/4 12 (NEEDLE) IMPLANT
SET WALTER ACTIVATION W/DRAPE (SET/KITS/TRAYS/PACK) IMPLANT
SPONGE INTESTINAL PEANUT (DISPOSABLE) ×1 IMPLANT
STOPCOCK 4 WAY LG BORE MALE ST (IV SETS) IMPLANT
SURGIFLO W/THROMBIN 8M KIT (HEMOSTASIS) IMPLANT
SUT ETHILON 3 0 PS 1 (SUTURE) IMPLANT
SUT PROLENE 5 0 C 1 24 (SUTURE) ×1 IMPLANT
SUT PROLENE 6 0 BV (SUTURE) ×1 IMPLANT
SUT VIC AB 2-0 CT1 27 (SUTURE) ×1
SUT VIC AB 2-0 CT1 TAPERPNT 27 (SUTURE) ×1 IMPLANT
SUT VIC AB 3-0 SH 27 (SUTURE) ×1
SUT VIC AB 3-0 SH 27X BRD (SUTURE) ×1 IMPLANT
SUT VIC AB 3-0 X1 27 (SUTURE) ×1 IMPLANT
SYR 3ML LL SCALE MARK (SYRINGE) IMPLANT
TOWEL GREEN STERILE (TOWEL DISPOSABLE) ×1 IMPLANT
TUBING EXTENTION W/L.L. (IV SETS) IMPLANT
UNDERPAD 30X36 HEAVY ABSORB (UNDERPADS AND DIAPERS) ×1 IMPLANT
WATER STERILE IRR 1000ML POUR (IV SOLUTION) ×1 IMPLANT

## 2022-11-23 NOTE — Progress Notes (Signed)
Lower extremity bypass graft left study completed.   Please see CV Proc for preliminary results.   Darlin Coco, RDMS, RVT

## 2022-11-23 NOTE — Anesthesia Procedure Notes (Signed)
Procedure Name: Intubation Date/Time: 11/23/2022 10:50 AM  Performed by: Lorie Phenix, CRNAPre-anesthesia Checklist: Patient identified, Emergency Drugs available, Suction available and Patient being monitored Patient Re-evaluated:Patient Re-evaluated prior to induction Oxygen Delivery Method: Circle system utilized Preoxygenation: Pre-oxygenation with 100% oxygen Induction Type: IV induction Ventilation: Mask ventilation without difficulty Laryngoscope Size: Mac and 3 Grade View: Grade I Tube type: Oral Number of attempts: 1 Airway Equipment and Method: Stylet Placement Confirmation: ETT inserted through vocal cords under direct vision, positive ETCO2 and breath sounds checked- equal and bilateral Secured at: 21 cm Tube secured with: Tape Dental Injury: Teeth and Oropharynx as per pre-operative assessment

## 2022-11-23 NOTE — Anesthesia Preprocedure Evaluation (Addendum)
Anesthesia Evaluation  Patient identified by MRN, date of birth, ID band Patient awake    Reviewed: Allergy & Precautions, NPO status , Patient's Chart, lab work & pertinent test results, reviewed documented beta blocker date and time   History of Anesthesia Complications Negative for: history of anesthetic complications  Airway Mallampati: I  TM Distance: >3 FB Neck ROM: Full    Dental  (+) Edentulous Upper, Edentulous Lower   Pulmonary neg pulmonary ROS    + decreased breath sounds      Cardiovascular hypertension, Pt. on home beta blockers and Pt. on medications + Peripheral Vascular Disease   Rhythm:Regular Rate:Normal     Neuro/Psych   Anxiety    Dementia negative neurological ROS     GI/Hepatic Neg liver ROS,GERD  Medicated,,  Endo/Other  diabetes, Type 2, Oral Hypoglycemic Agents    Renal/GU Renal InsufficiencyRenal disease     Musculoskeletal  (+) Arthritis , Rheumatoid disorders,    Abdominal   Peds  Hematology  (+) Blood dyscrasia, anemia   Anesthesia Other Findings   Reproductive/Obstetrics                             Anesthesia Physical Anesthesia Plan  ASA: 3  Anesthesia Plan: General   Post-op Pain Management: Tylenol PO (pre-op)*   Induction: Intravenous  PONV Risk Score and Plan: 4 or greater and Ondansetron, Dexamethasone, Diphenhydramine and Treatment may vary due to age or medical condition  Airway Management Planned: Oral ETT  Additional Equipment:   Intra-op Plan:   Post-operative Plan: Extubation in OR  Informed Consent: I have reviewed the patients History and Physical, chart, labs and discussed the procedure including the risks, benefits and alternatives for the proposed anesthesia with the patient or authorized representative who has indicated his/her understanding and acceptance.     Dental advisory given and Consent reviewed with POA  Plan  Discussed with: Anesthesiologist and CRNA  Anesthesia Plan Comments:        Anesthesia Quick Evaluation

## 2022-11-23 NOTE — Progress Notes (Signed)
PHARMACIST LIPID MONITORING   Wendy [name] is a 81 y.o. female admitted on 11/20/2022 with limb ischemia.  Pharmacy has been consulted to optimize lipid-lowering therapy with the indication of secondary prevention for clinical ASCVD.  Recent Labs:  Lipid Panel (last 6 months):   Lab Results  Component Value Date   CHOL 151 11/23/2022   TRIG 57 11/23/2022   HDL 37 (L) 11/23/2022   CHOLHDL 4.1 11/23/2022   VLDL 11 11/23/2022   LDLCALC 103 (H) 11/23/2022    Hepatic function panel (last 6 months):   Lab Results  Component Value Date   AST 18 11/22/2022   ALT 10 11/22/2022   ALKPHOS 44 11/22/2022   BILITOT 0.6 11/22/2022   BILIDIR <0.1 11/22/2022   IBILI NOT CALCULATED 11/22/2022    SCr (since admission):   Serum creatinine: 1.57 mg/dL (H) 11/23/22 4982 Estimated creatinine clearance: 23.3 mL/min (A)  Current therapy and lipid therapy tolerance Current lipid-lowering therapy: rosuvastatin 10 mg/d (none PTA) Previous lipid-lowering therapies (if applicable): atorvastatin Documented or reported allergies or intolerances to lipid-lowering therapies (if applicable): n/a  Assessment:   S/p left femoral-peroneal artery bypass. No statin PTA and did not see intolerance listed in chart.   Plan:    1.Statin intensity (high intensity recommended for all patients regardless of the LDL):  No statin changes. On max dose for kidney function.  2.Add ezetimibe (if any one of the following):   Not indicated at this time.  3.Refer to lipid clinic:   No  4.Follow-up with:  Primary care provider - Default, Provider, MD  5.Follow-up labs after discharge:  Changes in lipid therapy were made. Check a lipid panel in 8-12 weeks then annually.     Thank you for involving pharmacy in this patient's care.  Renold Genta, PharmD, BCPS Clinical Pharmacist Clinical phone for 11/23/2022 is [phone] 11/23/2022 1:31 PM

## 2022-11-23 NOTE — Anesthesia Postprocedure Evaluation (Signed)
Anesthesia Post Note  Patient: Wendy [name]  Procedure(s) Performed: THROMBECTOMY LEFT FEMORAL ARTERY TO POPLITEAL ARTERY BYPASS GRAFT (Left: Leg Lower)     Patient location during evaluation: PACU Anesthesia Type: General Level of consciousness: sedated Pain management: pain level controlled Vital Signs Assessment: post-procedure vital signs reviewed and stable Respiratory status: spontaneous breathing and respiratory function stable Cardiovascular status: stable Postop Assessment: no apparent nausea or vomiting Anesthetic complications: no   No notable events documented.  Last Vitals:  Vitals:   11/23/22 1245 11/23/22 1301  BP:  (!) 186/155  Pulse:  79  Resp: 15 15  Temp:  36.7 C  SpO2:  100%    Last Pain:  Vitals:   11/23/22 1301  TempSrc: Oral  PainSc: 0-No pain                 Joann Jorge DANIEL

## 2022-11-23 NOTE — Progress Notes (Addendum)
  Progress Note    11/23/2022 7:39 AM 1 Day Post-Op  Subjective:  asleep   Vitals:   11/23/22 0325 11/23/22 0400  BP: (!) 152/71 131/64  Pulse: 82   Resp: 20 18  Temp: 98.1 F (36.7 C)   SpO2: 100%    Physical Exam: Cardiac:  regular Lungs:  non labored Incisions: left groin incision c/d/I without swelling or hematoma. Left medial leg incision is clean, dry and intact. No swelling or hematoma. Extremities: very faint peroneal signal. Dry gangrene left foot Abdomen:  soft, non distended Neurologic: somnolent   CBC    Component Value Date/Time   WBC 11.6 (H) 11/23/2022 0635   RBC 3.57 (L) 11/23/2022 0635   HGB 8.1 (L) 11/23/2022 0635   HGB 9.0 (L) 05/03/2020 1005   HCT 27.1 (L) 11/23/2022 0635   HCT 28.9 (L) 05/03/2020 1005   PLT 345 11/23/2022 0635   PLT 476 (H) 05/03/2020 1005   MCV 75.9 (L) 11/23/2022 0635   MCV 78 (L) 05/03/2020 1005   MCH 22.7 (L) 11/23/2022 0635   MCHC 29.9 (L) 11/23/2022 0635   RDW 14.9 11/23/2022 0635   RDW 15.5 (H) 05/03/2020 1005   LYMPHSABS 1.4 11/20/2022 1638   LYMPHSABS 1.8 05/03/2020 1005   MONOABS 1.2 (H) 11/20/2022 1638   EOSABS 0.0 11/20/2022 1638   EOSABS 0.1 05/03/2020 1005   BASOSABS 0.0 11/20/2022 1638   BASOSABS 0.1 05/03/2020 1005    BMET    Component Value Date/Time   NA 138 11/23/2022 0635   NA 140 07/26/2020 1506   K 5.7 (H) 11/23/2022 0635   CL 111 11/23/2022 0635   CO2 21 (L) 11/23/2022 0635   GLUCOSE 262 (H) 11/23/2022 0635   BUN 14 11/23/2022 0635   BUN 37 (H) 07/26/2020 1506   CREATININE 1.57 (H) 11/23/2022 0635   CALCIUM 8.4 (L) 11/23/2022 0635   CALCIUM 10.1 06/07/2022 0938   GFRNONAA 33 (L) 11/23/2022 0635   GFRAA 32 (L) 08/02/2020 1410    INR    Component Value Date/Time   INR 1.2 11/22/2022 0628     Intake/Output Summary (Last 24 hours) at 11/23/2022 0739 Last data filed at 11/23/2022 0502 Gross per 24 hour  Intake 592.88 ml  Output 1175 ml  Net -582.12 ml     Assessment/Plan:   81 y.o. female is s/p Left femoral to peroneal artery bypass graft with 6 mm external ring PTFE  1 Day Post-Op   Noted to have significant pain yesterday evening. On pain regimen per primary team Very somnolent this morning likely from pain medication Left left incisions are clean, dry and intact ABLA HGB 8.8. VSS Very faint peroneal signal in left leg Ordered STAT LLE bypass graft duplex Please keep NPO for now. Pending results of duplex may need to return to [place], PA-C [facility] [phone] 11/23/2022 7:39 AM  I agree with the above.  I suspect she has had early occlusion of her graft due to no outflow.  I spoke with her daughter at the bedside.  I will plan on an attempt at thrombectomy at thrombectomy, but I am not optimistic about the long term patency of this because of her diseased target vessel and poor outflow.  I discussed that she is at high risk for leg amputation.  She also has similar issues on the right leg that will need to be addressed soon.  [name]

## 2022-11-23 NOTE — Assessment & Plan Note (Deleted)
K this AM was hemolysed, will repeat. Has received Lokelma x2. - f/u BMP - If repeat K still elevated, plan to increase bowel reg to improve K excretion

## 2022-11-23 NOTE — Progress Notes (Signed)
Called to bedside to evaluate patient status post thrombectomy of left femoral peroneal bypass with PTFE.  On my exam, I am unable to find any Doppler flow in the left lower extremity.  The left foot is cool.  The right foot is warm.  There is some ischemic skin changes to the left forefoot.  These are not new.  I had a long discussion with the patient's brother.  I explained that trying to reopen this bypass would not likely be successful.  I explained that she will likely require an amputation in the left lower extremity.  The patient is refusing any type of amputation at the moment.  I counseled that an amputation would purely be palliative, and would not extend her life.  I explained that she is likely near the end of her life. I have asked palliative care to evaluate the patient for assistance with decision making, goals of care, end-of-life.  Wendy [name]. Wendy Breed, MD [facility] Phone Number: [phone] 11/23/2022 5:04 PM

## 2022-11-23 NOTE — Transfer of Care (Signed)
Immediate Anesthesia Transfer of Care Note  Patient: Wendy [name]  Procedure(s) Performed: THROMBECTOMY LEFT FEMORAL ARTERY TO POPLITEAL ARTERY BYPASS GRAFT (Left: Leg Lower)  Patient Location: PACU  Anesthesia Type:General  Level of Consciousness: awake and alert   Airway & Oxygen Therapy: Patient Spontanous Breathing  Post-op Assessment: Report given to RN and Post -op Vital signs reviewed and stable  Post vital signs: Reviewed and stable  Last Vitals:  Vitals Value Taken Time  BP 171/73 11/23/22 1213  Temp    Pulse 77 11/23/22 1216  Resp 17 11/23/22 1216  SpO2 100 % 11/23/22 1216  Vitals shown include unvalidated device data.  Last Pain:  Vitals:   11/23/22 0325  TempSrc: Oral  PainSc:          Complications: No notable events documented.

## 2022-11-23 NOTE — Progress Notes (Signed)
Unable to locate pulses on patient's LLE using doppler.  Informed PA working with vascular.  She will make Dr. Trula Slade aware.

## 2022-11-23 NOTE — Assessment & Plan Note (Addendum)
Mag 1.8 - Continue to monitor with AM Mag checks

## 2022-11-23 NOTE — Progress Notes (Signed)
I had a lengthy discussion with the patient's daughter as well as their pastor after the operation.  I discussed that there are no more options for surgical revascularization of her leg.  I stated that I was skeptical that the bypass graft was going to remain patent as it occluded early and there were no technical issues with the operation.  I suspect that the bypass graft went down because there is no outflow.  If the bypass graft goes down, it will not be revised.  She will also be facing either a below-knee or an above-knee amputation.  I told them that I would favor an above-knee amputation for wound healing issues.  They are understanding of everything.  Wendy [name]

## 2022-11-23 NOTE — Consult Note (Signed)
   PODIATRY CONSULTATION  NAME Wendy [name] MRN [id number] DOB 1941-08-25 DOA 11/20/2022   Reason for consult: Critical Limb Ischemia both lower extremities Chief Complaint  Patient presents with   Circulatory Problem    Consulting physician: Dr. Genevie Ann MD/Samantha Rhyne PA  Assessment and plan: Podiatry consult by vascular for possible amputation lower extremities after femoral to peroneal artery bypass graft left lower extremity. After initial consult it appears that the bypass graft had occluded and patient returned to OR for revision.  According to the notes Dr. Trula Slade skeptical bypass graft would remain patent.  Recommend either below-knee or above-knee amputation.  I discussed this with the patient's son who was present this evening.  It appears there is no benefit to partial foot amputation or podiatry services at the moment.  Will sign off.  Thank you for the consult.  Please contact me directly via secure chat with any questions or concerns  Edrick Kins, DPM [facility]  Dr. Edrick Kins, DPM    [address], [place] [address]                Office [phone]  Fax [phone]

## 2022-11-23 NOTE — Progress Notes (Addendum)
Daily Progress Note Intern Pager: [phone]  Patient name: Wendy [name] Medical record number: [id number] Date of birth: [birth year] Age: 81 y.o. Gender: female  Primary Care Provider: Default, Provider, MD Consultants: Vascular surgery, Podiatry Code Status: Full  Pt Overview and Major Events to Date:  12/27 - Admitted, vascular surg consulted 12/28 - L leg femoral to peroneal artery bypass, podiatry consulted by vasc  Assessment and Plan: Wendy [name] is a 81 y.o. female that presented with bilateral leg pain found to have worsening chronic critical limb ischemia s/p stent with vascular surgery. PMH includes HTN, CKD, T2DM, Sjogren syndrome, anemia, GERD, HLD   * Critical limb ischemia of both lower extremities (HCC) S/p left leg femoral to peroneal artery bypass. Remains NPO in case of need for declotting procedure with vascular surgery pending duplex results. Podiatry consulted by vascular due to left foot tissue loss, likely will need at least partial amputation of foot.   - Vascular following, appreciate recs - Podiatry to see, appreciate recs - Heparin per pharmacy - Antibiotics per vascular (cefepime and vanc) - Monitor CBC - Bowel regiment: Miralax and senna - Pain Management  Tylenol '650mg'$  q6h   Gabapentin '200mg'$  TID  Oxy 2.'5mg'$  q4h PRN  Dilaudid 0.'5mg'$  q3h PRN for severe pain  Hypomagnesemia Mag 1.5 today. - Replete with 2g - AM Mag  Hyperkalemia K this morning is 5.7 - EKG - Continue to closely monitor with BMP this afternoon and tomorrow morning - Currently NPO for procedure  Essential hypertension Has been hypertensive to the 160s/70s on average, most recently normotensive - Continue home Metoprolol '25mg'$  daily  - Holding Irbesartan due to hyperkalemia - Holding home clonidine  Type 2 diabetes mellitus with kidney complication, without long-term current use of insulin (HCC) Received 2 Units of Novolog in the last 24h, will closely monitor.  - CBGs  QAC and QHS - sensitive SSI    FEN/GI: NPO, carb modified when able PPx: Heparin per pharmacy  Dispo:SNF pending clinical improvement . Barriers include continued work-up by podiatry and vascular surgery.   Subjective:  Patient sleeping heavily in the room. Daughter at bedside states that last night was the first night patient was able to get any rest.   Objective: Temp:  [97.8 F (36.6 C)-98.4 F (36.9 C)] 98.1 F (36.7 C) (12/29 0325) Pulse Rate:  [69-96] 78 (12/29 1010) Resp:  [15-20] 18 (12/29 0400) BP: (117-191)/(64-97) 167/83 (12/29 1010) SpO2:  [99 %-100 %] 100 % (12/29 0945) Arterial Line BP: (179-219)/(73-129) 192/129 (12/28 1600) Weight:  [54.4 kg] 54.4 kg (12/28 1049) Physical Exam: General: elderly female, sleeping heavily in bed, daughter at bedside Cardiovascular: RRR Respiratory: breathing comfortably on room air, CTAB Abdomen: soft, non-distended Extremities: dry gangrene of left foot, left groin dressing C/D/I, cold BLE  Laboratory: Most recent CBC Lab Results  Component Value Date   WBC 11.6 (H) 11/23/2022   HGB 8.1 (L) 11/23/2022   HCT 27.1 (L) 11/23/2022   MCV 75.9 (L) 11/23/2022   PLT 345 11/23/2022   Most recent BMP    Latest Ref Rng & Units 11/23/2022    6:35 AM  BMP  Glucose 70 - 99 mg/dL 262   BUN 8 - 23 mg/dL 14   Creatinine 0.44 - 1.00 mg/dL 1.57   Sodium 135 - 145 mmol/L 138   Potassium 3.5 - 5.1 mmol/L 5.7   Chloride 98 - 111 mmol/L 111   CO2 22 - 32 mmol/L 21   Calcium 8.9 -  10.3 mg/dL 8.4       [name], DO 11/23/2022, 10:23 AM  PGY-3, [place] Intern pager: [phone], text pages welcome Secure chat group [place]

## 2022-11-23 NOTE — Progress Notes (Signed)
OT Cancellation Note  Patient Details Name: Wendy [name] MRN: [id number] DOB: 1941/02/06   Cancelled Treatment:    Reason Eval/Treat Not Completed: Medical issues which prohibited therapy. Per chat text with RN just now, pt has orders in for a stat Vascular US - looks like they're going to try for a STAT bypass graft. Will check on pt later as time allows.  [place]en Circle, OTR/L [facility] [phone] Office [phone]    Almon Register 11/23/2022, 7:51 AM

## 2022-11-23 NOTE — Op Note (Addendum)
    Patient name: Wendy [name] MRN: [id number] DOB: [birth year] Sex: female  11/23/2022 Pre-operative Diagnosis: Occluded left femoral-peroneal bypass Post-operative diagnosis:  Same Surgeon:  [name] Assistants:  Ivin Booty, PA Procedure:   Thrombectomy of left femoral to peroneal bypass Anesthesia:  General Blood Loss:  150 cc Specimens:  none  Findings: Acute clot was seen within the bypass.  I explored the distal anastomosis and there were no technical issues.  I felt that the bypass went down because there was no outflow.  No further revisions of the bypass are recommended  Indications: This is an 81 year old female who presented with severe infection and ulcers to the left leg.  She underwent a left femoral to peroneal bypass graft with PTFE yesterday.  She did not have any adequate vein.  Her angiogram from 2 weeks ago shows very poor outflow.  Her bypass graft is occluded early.  I suspect this is from no outflow.  I discussed with the family and patient that we will give this 1 attempt to try to salvage her bypass however if this is unsuccessful she will require amputation.  Procedure:  The patient was identified in the holding area and taken to [place] [facility]  The patient was then placed supine on the table. general anesthesia was administered.  The patient was prepped and draped in the usual sterile fashion.  A time out was called and antibiotics were administered.  A PA was necessary to expedite the procedure and assist with technical details.  She provided exposure with suction and retraction.  She help with the thrombectomy portion of the procedure as well as closing of the graft.  The patient's previous medial below-knee incision was opened by removing the suture.  Suffered tenting retractors were placed, exposing the PTFE graft and the peroneal artery.  The patient was fully heparinized.  A #11 blade was used to make a longitudinal incision within the hood of the graft.  There  was acute thrombus within the graft.  I passed a #2 Fogarty catheter down which went approximately 20 cm to the ankle.  Thrombectomy was performed.  Acute clot was evacuated.  There was good backbleeding.  A second pass was performed.  This time some of the intima of the artery was also recovered.  There was good backbleeding.  I closed a small defect in the anterior surface of the artery with a running 6-0 Prolene.  I then used a number for an #6 Fogarty catheter to perform thrombectomy of the bypass graft.  Multiple passes were performed until no further clot was evacuated and there was excellent inflow.  I then closed the graftotomy with a running 6-0 Prolene.  At this point there were excellent signals in the peroneal artery distal to the anastomosis.  I did not get good signals at the ankle, however I felt this was likely secondary to spasm.  I do not think there is anything else that can be done to maintain patency of his bypass graft.  I started her on a heparin drip at 1000 units an hour.  The wound was irrigated.  Hemostasis was achieved.  The fascia was reapproximated 2-0 Vicryl and the skin was closed with Vicryl followed by Dermabond.  There were no immediate complications.   Disposition: To PACU stable.   Theotis Burrow, M.D., [facility] [place] Office: [phone] Pager:  [phone]

## 2022-11-23 NOTE — Progress Notes (Signed)
ANTICOAGULATION CONSULT NOTE - Follow-Up Consult  Pharmacy Consult for heparin Indication: Ischemic limb  Allergies  Allergen Reactions   Codeine    Erythromycin    Leflunomide Other (See Comments)   Lisinopril    Naproxen    Penicillins     Filled Augmentin 09/2022   Pioglitazone    Valsartan Other (See Comments)    Patient Measurements: Height: '5\' 4"'$  (162.6 cm) Weight: 54.4 kg (119 lb 14.9 oz) IBW/kg (Calculated) : 54.7 Heparin Dosing Weight: TBW  Vital Signs: Temp: 97.8 F (36.6 C) (12/29 1624) Temp Source: Oral (12/29 1624) BP: 141/61 (12/29 1624) Pulse Rate: 75 (12/29 1624)  Labs: Recent Labs    11/22/22 0628 11/23/22 0635 11/23/22 1441 11/23/22 1741  HGB 8.8* 8.1*  --   --   HCT 29.2* 27.1*  --   --   PLT 352 345  --   --   APTT 124* 67*  --   --   LABPROT 14.7  --   --   --   INR 1.2  --   --   --   HEPARINUNFRC 0.45  --   --  0.36  CREATININE 1.72* 1.57* 1.42*  --      Estimated Creatinine Clearance: 25.8 mL/min (A) (by C-G formula based on SCr of 1.42 mg/dL (H)).   Medical History: Past Medical History:  Diagnosis Date   Acquired hammer toes of both feet    Anemia    Anxiety    Arthritis    Chronic kidney disease    Diabetes mellitus without complication (HCC)    Eczema    Environmental allergies    Fibrocystic breast changes    GERD (gastroesophageal reflux disease)    Hearing loss    Hypercholesteremia    Hypertension    IBS (irritable bowel syndrome)    Incontinence    Memory loss    Osteoarthritis    PVD (peripheral vascular disease) (HCC)    Rheumatoid arthritis (HCC)     Assessment: 81 yo F presenting with ischemic limb, she is not on anticoagulation PTA.  Patient started on heparin infusion.  Heparin level therapeutic.   S/p peroneal bypass. Heparin was restarted. Heparin level therapeutic tonight. We will cont with current rate and check confirm in AM.  Goal of Therapy:  Heparin level 0.3-0.7 units/ml Monitor platelets  by anticoagulation protocol: Yes   Plan:  Continue heparin infusion at 1000units/hr Daily heparin level/CBC  Onnie Boer, PharmD, BCIDP, AAHIVP, CPP Infectious Disease Pharmacist 11/23/2022 7:05 PM

## 2022-11-23 NOTE — Care Management Important Message (Signed)
Important Message  Patient Details  Name: Wendy [name] MRN: [id number] Date of Birth: [birth year]   Medicare Important Message Given:  Yes     Shelda Altes 11/23/2022, 10:01 AM

## 2022-11-23 NOTE — Progress Notes (Signed)
The patient's daughter, Army Chaco, was by her bedside this morning. I answered all her questions regarding her mother's health conditions. She requested a phone call from vascular and podiatry if she is not in the hospital when they come in to evaluate her mother. She can be reached at [phone].

## 2022-11-24 ENCOUNTER — Encounter (HOSPITAL_COMMUNITY): Payer: Self-pay | Admitting: Surgery

## 2022-11-24 DIAGNOSIS — E875 Hyperkalemia: Secondary | ICD-10-CM

## 2022-11-24 DIAGNOSIS — L03119 Cellulitis of unspecified part of limb: Secondary | ICD-10-CM | POA: Diagnosis not present

## 2022-11-24 DIAGNOSIS — I70223 Atherosclerosis of native arteries of extremities with rest pain, bilateral legs: Secondary | ICD-10-CM | POA: Diagnosis not present

## 2022-11-24 DIAGNOSIS — D62 Acute posthemorrhagic anemia: Secondary | ICD-10-CM

## 2022-11-24 DIAGNOSIS — Z7189 Other specified counseling: Secondary | ICD-10-CM

## 2022-11-24 DIAGNOSIS — L8990 Pressure ulcer of unspecified site, unspecified stage: Secondary | ICD-10-CM | POA: Insufficient documentation

## 2022-11-24 DIAGNOSIS — I998 Other disorder of circulatory system: Secondary | ICD-10-CM

## 2022-11-24 DIAGNOSIS — Z515 Encounter for palliative care: Secondary | ICD-10-CM

## 2022-11-24 LAB — BASIC METABOLIC PANEL
Anion gap: 4 — ABNORMAL LOW (ref 5–15)
Anion gap: 6 (ref 5–15)
BUN: 13 mg/dL (ref 8–23)
BUN: 14 mg/dL (ref 8–23)
CO2: 20 mmol/L — ABNORMAL LOW (ref 22–32)
CO2: 18 mmol/L — ABNORMAL LOW (ref 22–32)
Calcium: 8.3 mg/dL — ABNORMAL LOW (ref 8.9–10.3)
Calcium: 8.1 mg/dL — ABNORMAL LOW (ref 8.9–10.3)
Chloride: 114 mmol/L — ABNORMAL HIGH (ref 98–111)
Chloride: 113 mmol/L — ABNORMAL HIGH (ref 98–111)
Creatinine, Ser: 1.47 mg/dL — ABNORMAL HIGH (ref 0.44–1.00)
Creatinine, Ser: 1.54 mg/dL — ABNORMAL HIGH (ref 0.44–1.00)
GFR, Estimated: 35 mL/min — ABNORMAL LOW (ref >60)
GFR, Estimated: 33 mL/min — ABNORMAL LOW (ref >60)
Glucose, Bld: 140 mg/dL — ABNORMAL HIGH (ref 70–99)
Glucose, Bld: 247 mg/dL — ABNORMAL HIGH (ref 70–99)
Potassium: 5.4 mmol/L — ABNORMAL HIGH (ref 3.5–5.1)
Potassium: 5.4 mmol/L — ABNORMAL HIGH (ref 3.5–5.1)
Sodium: 138 mmol/L (ref 135–145)
Sodium: 137 mmol/L (ref 135–145)

## 2022-11-24 LAB — HEMOGLOBIN AND HEMATOCRIT, BLOOD
HCT: 23 % — ABNORMAL LOW (ref 36.0–46.0)
Hemoglobin: 6.8 g/dL — CL (ref 12.0–15.0)

## 2022-11-24 LAB — CBC
HCT: 24.5 % — ABNORMAL LOW (ref 36.0–46.0)
Hemoglobin: 7.2 g/dL — ABNORMAL LOW (ref 12.0–15.0)
MCH: 23.5 pg — ABNORMAL LOW (ref 26.0–34.0)
MCHC: 29.4 g/dL — ABNORMAL LOW (ref 30.0–36.0)
MCV: 79.8 fL — ABNORMAL LOW (ref 80.0–100.0)
Platelets: 344 10*3/uL (ref 150–400)
RBC: 3.07 MIL/uL — ABNORMAL LOW (ref 3.87–5.11)
RDW: 15.4 % (ref 11.5–15.5)
WBC: 16.4 10*3/uL — ABNORMAL HIGH (ref 4.0–10.5)
nRBC: 0 % (ref 0.0–0.2)

## 2022-11-24 LAB — PREPARE RBC (CROSSMATCH)

## 2022-11-24 LAB — GLUCOSE, CAPILLARY
Glucose-Capillary: 154 mg/dL — ABNORMAL HIGH (ref 70–99)
Glucose-Capillary: 272 mg/dL — ABNORMAL HIGH (ref 70–99)
Glucose-Capillary: 254 mg/dL — ABNORMAL HIGH (ref 70–99)
Glucose-Capillary: 244 mg/dL — ABNORMAL HIGH (ref 70–99)

## 2022-11-24 LAB — MAGNESIUM: Magnesium: 2.1 mg/dL (ref 1.7–2.4)

## 2022-11-24 LAB — HEPARIN LEVEL (UNFRACTIONATED): Heparin Unfractionated: 0.45 IU/mL (ref 0.30–0.70)

## 2022-11-24 MED ORDER — SODIUM CHLORIDE 0.9% IV SOLUTION: Freq: Once | INTRAVENOUS | Status: AC

## 2022-11-24 MED ORDER — ASPIRIN 81 MG PO TBEC
81.0000 mg | DELAYED_RELEASE_TABLET | Freq: Every day | ORAL | Status: DC
  Administered 2022-11-24: 81 mg via ORAL
  Filled 2022-11-24: qty 1

## 2022-11-24 MED ORDER — SODIUM CHLORIDE 0.9 % IV SOLN
2.0000 g | INTRAVENOUS | Status: DC
  Administered 2022-11-25 – 2022-11-28 (×4): 2 g via INTRAVENOUS
  Filled 2022-11-24 (×4): qty 20

## 2022-11-24 MED ORDER — SODIUM ZIRCONIUM CYCLOSILICATE 10 G PO PACK
10.0000 g | PACK | Freq: Two times a day (BID) | ORAL | Status: AC
  Administered 2022-11-24 (×2): 10 g via ORAL
  Filled 2022-11-24 (×2): qty 1

## 2022-11-24 MED ORDER — VANCOMYCIN HCL IN DEXTROSE 1-5 GM/200ML-% IV SOLN
1000.0000 mg | INTRAVENOUS | Status: DC
  Administered 2022-11-25 – 2022-11-27 (×2): 1000 mg via INTRAVENOUS
  Filled 2022-11-24 (×2): qty 200

## 2022-11-24 NOTE — Progress Notes (Signed)
PT Cancellation Note  Patient Details Name: Wendy [name] MRN: [id number] DOB: July 15, 1941   Cancelled Treatment:    Reason Eval/Treat Not Completed: Other (comment) (Per RN pt very painful, awaiting palliative care meeting at 12:00pm).  Wyona Almas, PT, DPT Acute Rehabilitation Services Office [place] 11/24/2022, 12:07 PM

## 2022-11-24 NOTE — Progress Notes (Addendum)
FMTS Brief Progress Note  S:Arrived at bedside with Dr. Ruben Im for night round. Patient was laying down comfortably in bed with son and daughter at bedside. She has no complain, but endorses mild pain on the right leg. Daughter at bedside expressed concern that her mother dementia might have worsen because she's not getting her home Rivastigmine patch which has improved her mental status in the past.  She has tried multiple medications in the past but the Rivastigmine patch has worked best for her per daughter.   O: BP (!) 118/55 (BP Location: Right Arm)   Pulse 74   Temp 98.7 F (37.1 C) (Oral)   Resp 20   Ht '5\' 4"'$  (1.626 m)   Wt 54.4 kg   SpO2 100%   BMI 20.59 kg/m   General: Alert, awake laying in bed, NAD Pulm: Normal WOB, clear speech  Ext: Left foot cold to touch with notable blackening of all five toes, unable to feel palpable pulse. Questionable sensation on the left foot. Right foot is warm to touch, palpable pulse and sensation intact with normal skin color.    A/P: Stable overall and pain well controlled -Continue goal of care conversation with patient's family -Will consider restarting home Rivastigmine patch - Orders reviewed. Labs for AM ordered, which was adjusted as needed.    Alen Bleacher, MD 11/24/2022, 9:54 PM PGY-2, [place] Night Resident  Please page [phone] with questions.

## 2022-11-24 NOTE — Progress Notes (Addendum)
Daily Progress Note Intern Pager: [phone]  Patient name: Wendy [name] Medical record number: [id number] Date of birth: 1941-03-12 Age: 81 y.o. Gender: female  Primary Care Provider: Default, Provider, MD Consultants: Vascular surgery, Podiatry  Code Status: Full   Pt Overview and Major Events to Date:  12/27 - Admitted, vascular surg consulted 12/28 - L leg femoral to peroneal artery bypass, podiatry consulted by vasc  Assessment and Plan:  Wendy [name] is a 81 y.o. female that presented with bilateral leg pain found to have worsening chronic critical limb ischemia s/p stent with vascular surgery. PMH includes HTN, CKD, T2DM, Sjogren syndrome, anemia, GERD, HLD    * Critical limb ischemia of both lower extremities (HCC) S/p left leg femoral to peroneal artery bypass. Bypass graft occluded likely due to no outflow and not able to be revised per vascular. Will likely require amputation.   - Vascular following, appreciate recs - Podiatry following, appreciate recs - Heparin discontinued per vascular - Antibiotics per vascular (cefepime and vanc) - Monitor CBC - Bowel regiment: Miralax and senna - Pain Management  Tylenol '650mg'$  J4N   Gabapentin '200mg'$  TID  Oxy 2.'5mg'$  q4h PRN  Dilaudid 0.'5mg'$  q3h PRN for severe pain  Hypomagnesemia Stable. Mag 2.1 today s/p repletion yesterday - Continue to monitor with AM Mag  Hyperkalemia K this morning is 5.4. Lokelma ordered 10g x 2 doses - Continue to closely monitor with BMP this afternoon and tomorrow morning  Essential hypertension Has been hypertensive to the 160s/70s on average, most recently normotensive - Continue home Metoprolol '25mg'$  daily  - Holding Irbesartan due to hyperkalemia - Holding home clonidine  Type 2 diabetes mellitus with kidney complication, without long-term current use of insulin (HCC) Received 4 Units of Novolog in the last 24h, will closely monitor.  - CBGs QAC and QHS - sensitive SSI   Goals of  care Will have a family meeting this afternoon at 39 with palliative to discuss patient's current situation and prognosis with the family, and have a goals of care discussion.  FEN/GI: Dys 3 diet  PPx: Heparin d/c per VVS Dispo:SNF pending improvement and continued work up vs goals of care discussion   Subjective:   Overnight patients hemoglobin dropped to 7.1 and required a 1 unit RBC transfusion. Son bedside. Received page from RN that patient was crying in pain and refusing oral pain medication. Was in pain when the pain medication was pushed via IV. Patient expresses frustration that she has been stuck so many times. Discussed trying the oral medication first and then the IV pain medication after. Son states that family will be here this afternoon and would like to have a discussion with the team about patient's prognosis and goals of care.   Objective: Temp:  [97.5 F (36.4 C)-98.7 F (37.1 C)] 98.7 F (37.1 C) (12/30 0900) Pulse Rate:  [72-97] 80 (12/30 0826) Resp:  [12-20] 20 (12/30 0826) BP: (121-186)/(57-155) 177/81 (12/30 0826) SpO2:  [86 %-100 %] 100 % (12/30 0826) Physical Exam: General: Chronically ill appearing, laying in bed, son bedside Cardiovascular: RRR Respiratory: CTAB normal WOB on RA Abdomen: soft, non distended Extremities: Dry gangrene of L foot, cold extremities bilaterally without pedal pulse  Laboratory: Most recent CBC Lab Results  Component Value Date   WBC 16.4 (H) 11/24/2022   HGB 7.2 (L) 11/24/2022   HCT 24.5 (L) 11/24/2022   MCV 79.8 (L) 11/24/2022   PLT 344 11/24/2022   Most recent BMP  Latest Ref Rng & Units 11/24/2022    1:18 AM  BMP  Glucose 70 - 99 mg/dL 140   BUN 8 - 23 mg/dL 13   Creatinine 0.44 - 1.00 mg/dL 1.47   Sodium 135 - 145 mmol/L 138   Potassium 3.5 - 5.1 mmol/L 5.4   Chloride 98 - 111 mmol/L 114   CO2 22 - 32 mmol/L 20   Calcium 8.9 - 10.3 mg/dL 8.3     Imaging/Diagnostic Tests: None new  Shary Key,  DO 11/24/2022, 11:03 AM  PGY-3, [place] Intern pager: [phone], text pages welcome Secure chat group [place]

## 2022-11-24 NOTE — Progress Notes (Signed)
ANTICOAGULATION CONSULT NOTE - Follow-Up Consult  Pharmacy Consult for heparin Indication: Ischemic limb  Allergies  Allergen Reactions   Codeine    Erythromycin    Leflunomide Other (See Comments)   Lisinopril    Naproxen    Penicillins     Filled Augmentin 09/2022   Pioglitazone    Valsartan Other (See Comments)    Patient Measurements: Height: '5\' 4"'$  (162.6 cm) Weight: 54.4 kg (119 lb 14.9 oz) IBW/kg (Calculated) : 54.7 Heparin Dosing Weight: TBW  Vital Signs: Temp: 98.7 F (37.1 C) (12/30 0900) Temp Source: Oral (12/30 0900) BP: 177/81 (12/30 0826) Pulse Rate: 85 (12/30 0736)  Labs: Recent Labs    11/22/22 0628 11/23/22 0635 11/23/22 1441 11/23/22 1741 11/24/22 0118 11/24/22 0545  HGB 8.8* 8.1*  --   --   --  7.2*  HCT 29.2* 27.1*  --   --   --  24.5*  PLT 352 345  --   --   --  344  APTT 124* 67*  --   --   --   --   LABPROT 14.7  --   --   --   --   --   INR 1.2  --   --   --   --   --   HEPARINUNFRC 0.45  --   --  0.36 0.45  --   CREATININE 1.72* 1.57* 1.42*  --  1.47*  --      Estimated Creatinine Clearance: 24.9 mL/min (A) (by C-G formula based on SCr of 1.47 mg/dL (H)).   Medical History: Past Medical History:  Diagnosis Date   Acquired hammer toes of both feet    Anemia    Anxiety    Arthritis    Chronic kidney disease    Diabetes mellitus without complication (HCC)    Eczema    Environmental allergies    Fibrocystic breast changes    GERD (gastroesophageal reflux disease)    Hearing loss    Hypercholesteremia    Hypertension    IBS (irritable bowel syndrome)    Incontinence    Memory loss    Osteoarthritis    PVD (peripheral vascular disease) (HCC)    Rheumatoid arthritis (HCC)     Assessment: 81 yo F presenting with ischemic limb, she is not on anticoagulation PTA.  Patient started on heparin infusion.    Heparin level therapeutic this morning at 0.45, but hemoglobin dropping from 8.1 to 7.2, receiving PRBC transfusion. Per  VVS will stop heparin infusion.   Goal of Therapy:  Heparin level 0.3-0.7 units/ml Monitor platelets by anticoagulation protocol: Yes   Plan:  Stop heparin per VVS   Eliseo Gum, PharmD PGY1 Pharmacy Resident   11/24/2022  9:37 AM

## 2022-11-24 NOTE — Consult Note (Cosign Needed Addendum)
Palliative Medicine Inpatient Consult Note  Consulting Provider: Cherre Robins, MD   Reason for consult:   [place] Palliative Medicine Consult  Reason for Consult? thrombosed left leg bypass; needs AKA. Patient not interested in amputaiton. Help with end-of-life decision making. Would benefit from comfort measures only.   11/24/2022  HPI:  Per intake H&P --> Wendy [name] is a 81 y.o. female that presented with bilateral leg pain found to have worsening chronic critical limb ischemia s/p stent with vascular surgery. PMH includes HTN, CKD, T2DM, Sjogren syndrome, anemia, GERD, HLD     Palliative care has been asked to get involved to support further goals of care conversations in the setting of decisions related to L AKA.   Clinical Assessment/Goals of Care:  *Please note that this is a verbal dictation therefore any spelling or grammatical errors are due to the "[place] [facility]" system interpretation.  I have reviewed medical records including EPIC notes, labs and imaging, received report from bedside RN, assessed the patient who is lying in bed pleasantly disoriented.    Both myself and family resident, Dr. Arby Barrette  met this afternoon with patients daughters, [name] in person and Lorriane Shire to further discuss diagnosis prognosis, GOC, EOL wishes, disposition and options.   I introduced Palliative Medicine as specialized medical care for people living with serious illness. It focuses on providing relief from the symptoms and stress of a serious illness. The goal is to improve quality of life for both the patient and the family.  [name] shares with [place] that Wendy [name] is and has been chronically ill in the setting of dementia, hypertension, kidney disease, diabetes, rheumatoid arthritis, osteoarthritis.  Patient has lived both here throughout the duration of her life though incrementally has gone to [place] to live with her daughters in the setting of acute  illness phases.  [name] expresses that she always does better when she is in the presence of her daughters though being that she is from the [facility] area and has lived in the home she is presently in throughout her whole life this is where she is always preferred to be.  As far as the type of person she is - she is a widow and has had 5 children 1 son who lives with her by the name of [name], 1 son who was in [place] 1 son who is in [place] and 2 daughters who are in [place].  She is known to be a very strong and independent woman.  She loves church and travel.  Prior to admission Ms. Mccamish had been able to mobilize and help with basic self-care activities.  Her son would arrange for her meals as well as medications.  In addition he would help her to doctors appointments.  We discussed patient's current health state in the setting of her peripheral vascular disease and ischemia of her left lower extremity.  Per patient's daughter she and her family understand that her mother may need to [place] a left above-the-knee amputation and that her right limbs future is also unknown.  We discussed whether or not it is believed that the patient would desire an amputation.  [name] is very much of the mindset that patient fluctuates in and out though she can if clear minded make this decision for herself.  Patient's daughter would prefer this as well.  We discussed the different roads one - being amputation and the potential complications associated with that.  We discussed alternatively not pursuing  an amputation and what the future that may look like in terms of septicemia and poor prognosis. I did discuss the option is not amputation is selected that it would be worthwhile to proceed at that time with hospice. Patients daughters are familiar with this from the loss of their father.   We reviewed that it is very important for patients family to understand what Wendy [name] would want. As it sits  presently she has been too delirious to participate in decisions. We determined that we will continue on a daily basis to assess patients capacity to further determine if patient can make this decision or not. If not I shared that being patient has no HCPOA decisions would need to be based on the majority of the five children.  Patients daughter, [name] feels strongly that Ms. Kjos wants to live ideally another 2-3 years. They had recently discussed plans for travel. When we discussed CPR - patients daughters are of the mindset that their mother would want every measure pursued to prolong life.   We reviewed that patients family would like to hear directly from the vascular surgeon to get an idea of what to do moving forward. We plan to meet again on Monday - time is to be determined.   Plan for the time being is to continue with full aggressive care.  Discussed the importance of continued conversation with family and their  medical providers regarding overall plan of care and treatment options, ensuring decisions are within the context of the patients values and GOCs.  Decision Maker: Jerilee Hoh Daughter: [phone]  SUMMARY OF RECOMMENDATIONS   FULL CODE  Patients family would like to meet with the vascular surgery team on Monday in the presence of the patient to determine what the patient wants. If she does not have decision making capacity at that time family understands that they may need to make decisions.  Ongoing Palliative support  Code Status/Advance Care Planning: FULL CODE  Palliative Prophylaxis:  Aspiration, Bowel Regimen, Delirium Protocol, Frequent Pain Assessment, Oral Care, Palliative Wound Care, and Turn Reposition  Additional Recommendations (Limitations, Scope, Preferences): Continue present care  Psycho-social/Spiritual:  Desire for further Chaplaincy support: Not presently Additional Recommendations: Education on ischemic limb   Prognosis: Guarded at  this time  Discharge Planning: Unclear.  Vitals:   11/24/22 0826 11/24/22 0900  BP: (!) 177/81   Pulse:    Resp:    Temp: 98.1 F (36.7 C) 98.7 F (37.1 C)  SpO2:      Intake/Output Summary (Last 24 hours) at 11/24/2022 1021 Last data filed at 11/24/2022 0050 Gross per 24 hour  Intake 1345.33 ml  Output 5 ml  Net 1340.33 ml   Last Weight  Most recent update: 11/22/2022 10:51 AM    Weight  54.4 kg (119 lb 14.9 oz)            Gen:  Elderly AA F in NAD HEENT: moist mucous membranes CV: Regular rate and rhythm  PULM:  On RA breathing is even and nonlabored ABD: soft/nontender  EXT: (+) L toes gangrene Neuro: Alert and oriented to self  PPS: 40-50%   This conversation/these recommendations were discussed with patient primary care team, Dr. Owens Shark  Total Time: 32 Billing based on MDM: High  ______________________________________________________ [place] Team Team Cell Phone: [phone] Please utilize secure chat with additional questions, if there is no response within 30 minutes please call the above phone number  Palliative Medicine Team providers are  available by phone from 7am to 7pm daily and can be reached through the team cell phone.  Should this patient require assistance outside of these hours, please call the patient's attending physician.

## 2022-11-24 NOTE — Progress Notes (Signed)
   11/24/22 2223  Provider Notification  Provider Name/Title Ruben Im MD  Date Provider Notified 11/24/22  Time Provider Notified 2223  Method of Notification Page  Notification Reason Critical Result  Test performed and critical result Hgb  Date Critical Result Received 11/24/22  Time Critical Result Received 2223  Provider response See new orders  Date of Provider Response 11/24/22  Time of Provider Response 2225

## 2022-11-24 NOTE — Progress Notes (Signed)
Pharmacy Antibiotic Note  Wendy [name] is a 81 y.o. female admitted on 11/20/2022 presenting with limb ischemia, toe ulceration and concern for cellulitis.  Pharmacy has been consulted for vancomycin and cefepime dosing. WBC elevated at 16.4, afebrile, Scr improved at 1.47 now at baseline.   Plan: Increase vancomycin to 1000 mg IV q 48h (eAUC 506.9, Scr 1.47, using TBW, Vd 0.72)  Continue cefepime 2g IV every 24 hours Monitor renal function, clinical progression to narrow, levels at [facility] as indicated   Height: '5\' 4"'$  (162.6 cm) Weight: 54.4 kg (119 lb 14.9 oz) IBW/kg (Calculated) : 54.7  Temp (24hrs), Avg:97.9 F (36.6 C), Min:97.5 F (36.4 C), Max:98.7 F (37.1 C)  Recent Labs  Lab 11/20/22 1638 11/22/22 0628 11/23/22 0635 11/23/22 1441 11/24/22 0118 11/24/22 0545  WBC 13.0* 10.3 11.6*  --   --  16.4*  CREATININE 1.76* 1.72* 1.57* 1.42* 1.47*  --      Estimated Creatinine Clearance: 24.9 mL/min (A) (by C-G formula based on SCr of 1.47 mg/dL (H)).    Allergies  Allergen Reactions   Codeine    Erythromycin    Leflunomide Other (See Comments)   Lisinopril    Naproxen    Penicillins     Filled Augmentin 09/2022   Pioglitazone    Valsartan Other (See Comments)    Eliseo Gum, PharmD PGY1 Pharmacy Resident   11/24/2022  9:55 AM

## 2022-11-24 NOTE — Progress Notes (Addendum)
Progress Note    11/24/2022 9:12 AM 1 Day Post-Op  Subjective:  very quiet. says she is very cold this morning and having a lot of pain especially in left foot. Son is present at bedside   Vitals:   11/24/22 0826 11/24/22 0900  BP: (!) 177/81   Pulse:    Resp:    Temp: 98.1 F (36.7 C) 98.7 F (37.1 C)  SpO2:     Physical Exam: Cardiac:  regular Lungs:  non labored Incisions:  Left groin and left leg incisions are intact and well appearing. No swelling or hematoma Extremities:  right peroneal doppler signal. Very faint peroneal signal, monophasic possibly venous signal. Right warm, left foot and distal leg cool. Dry gangrene to left forefoot and toes Abdomen:  soft Neurologic: alert and oriented  CBC    Component Value Date/Time   WBC 16.4 (H) 11/24/2022 0545   RBC 3.07 (L) 11/24/2022 0545   HGB 7.2 (L) 11/24/2022 0545   HGB 9.0 (L) 05/03/2020 1005   HCT 24.5 (L) 11/24/2022 0545   HCT 28.9 (L) 05/03/2020 1005   PLT 344 11/24/2022 0545   PLT 476 (H) 05/03/2020 1005   MCV 79.8 (L) 11/24/2022 0545   MCV 78 (L) 05/03/2020 1005   MCH 23.5 (L) 11/24/2022 0545   MCHC 29.4 (L) 11/24/2022 0545   RDW 15.4 11/24/2022 0545   RDW 15.5 (H) 05/03/2020 1005   LYMPHSABS 1.4 11/20/2022 1638   LYMPHSABS 1.8 05/03/2020 1005   MONOABS 1.2 (H) 11/20/2022 1638   EOSABS 0.0 11/20/2022 1638   EOSABS 0.1 05/03/2020 1005   BASOSABS 0.0 11/20/2022 1638   BASOSABS 0.1 05/03/2020 1005    BMET    Component Value Date/Time   NA 138 11/24/2022 0118   NA 140 07/26/2020 1506   K 5.4 (H) 11/24/2022 0118   CL 114 (H) 11/24/2022 0118   CO2 20 (L) 11/24/2022 0118   GLUCOSE 140 (H) 11/24/2022 0118   BUN 13 11/24/2022 0118   BUN 37 (H) 07/26/2020 1506   CREATININE 1.47 (H) 11/24/2022 0118   CALCIUM 8.3 (L) 11/24/2022 0118   CALCIUM 10.1 06/07/2022 0938   GFRNONAA 35 (L) 11/24/2022 0118   GFRAA 32 (L) 08/02/2020 1410    INR    Component Value Date/Time   INR 1.2 11/22/2022 0628      Intake/Output Summary (Last 24 hours) at 11/24/2022 0912 Last data filed at 11/24/2022 0050 Gross per 24 hour  Intake 1345.33 ml  Output 5 ml  Net 1340.33 ml     Assessment/Plan:  81 y.o. female is s/p Left femoral to peroneal artery bypass graft with 6 mm external ring PTFE 2 Days Post-Op, Thrombectomy of left femoral to peroneal artery bypass graft  1 Day Post-Op   No doppler signal in left lower extremity. Left foot cool Having significant pain in left leg/ foot Mild right foot pain Her incisions are intact and well appearing in left groin and left medial leg Left calf soft Left foot with dry gangrenous changes to forefoot and toes ABLA hgb 7.2 1 Unit PRBC ordered Both Dr. Trula Slade and Dr. Stanford Breed have spoken at length with patient and her family members regarding her options moving forward for her left leg Unfortunately her bypass has not remained open due to poor outflow despite attempts at revascularization She would best be managed with a left above knee amputation for best healing At this time she is not ready to make a decision regarding this Her right lower  extremity is also at risk and she will likely require bypass vs amputation as well Palliative care has been consulted to discuss with patient and her family goals of care, end of life planning, and assist in decision making process  Okay to eat from vascular standpoint. No surgery scheduled at this time   Karoline Caldwell, [facility] [phone] 11/24/2022 9:12 AM  VASCULAR STAFF ADDENDUM: I have independently interviewed and examined the patient. I agree with the above.  Difficult situation. Greatly appreciate palliative care help. AKA vs. Comfort measures would be appropriate. Will follow.  Yevonne Aline. Stanford Breed, MD [facility] Phone Number: [phone] 11/24/2022 10:34 AM

## 2022-11-24 NOTE — Evaluation (Signed)
Clinical/Bedside Swallow Evaluation Patient Details  Name: Wendy [name] MRN: [id number] Date of Birth: 12/15/1940  Today's Date: 11/24/2022 Time: SLP Start Time (ACUTE ONLY): 30 SLP Stop Time (ACUTE ONLY): 3007 SLP Time Calculation (min) (ACUTE ONLY): 15 min  Past Medical History:  Past Medical History:  Diagnosis Date   Acquired hammer toes of both feet    Anemia    Anxiety    Arthritis    Chronic kidney disease    Diabetes mellitus without complication (HCC)    Eczema    Environmental allergies    Fibrocystic breast changes    GERD (gastroesophageal reflux disease)    Hearing loss    Hypercholesteremia    Hypertension    IBS (irritable bowel syndrome)    Incontinence    Memory loss    Osteoarthritis    PVD (peripheral vascular disease) ([place])    Rheumatoid arthritis ([place])    Past Surgical History:  Past Surgical History:  Procedure Laterality Date   ABDOMINAL AORTOGRAM W/LOWER EXTREMITY N/A 11/12/2022   Procedure: ABDOMINAL AORTOGRAM W/LOWER EXTREMITY;  Surgeon: Waynetta Sandy, MD;  Location: [place];  Service: Cardiovascular;  Laterality: N/A;   ABDOMINAL HYSTERECTOMY     BYPASS GRAFT FEMORAL-PERONEAL Left 11/22/2022   Procedure: LEFT COMMON FEMORAL-PERONEAL ARTERY BYPASS;  Surgeon: Serafina Mitchell, MD;  Location: [facility];  Service: Vascular;  Laterality: Left;   SHOULDER SURGERY     FATTY TUMOR REMOVED   THROMBECTOMY FEMORAL ARTERY Left 11/23/2022   Procedure: THROMBECTOMY LEFT FEMORAL ARTERY TO POPLITEAL ARTERY BYPASS GRAFT;  Surgeon: Serafina Mitchell, MD;  Location: [facility];  Service: Vascular;  Laterality: Left;   HPI:  Patient is an 81 y.o. female with PMH: HTN, DM-2,  CKD IIIb, dementia. She presented to the hospital with bilateral leg pain secondary to chronic severe critical limb ischemia. She was afebrile with mild leukocytosis on arrival with low concern for sepsis. SLP swallow evaluation ordered secondary to patient with difficulty  taking medications, difficulty with solid foods and family reporting h/o esophageal dysphagia (stricture?).    Assessment / Plan / Recommendation  Clinical Impression  Swallow evaluation was limited secondary to patient very somnolent and had recently had pain medication. She was able to drink a small amount of thin liquids (Glucerna shake) but had difficulty drawing liquid up through straw. Daughter who is in the room reported that patient did have h/o difficulty swallowing pills but she does fine with them currently as long as they are given one at a time. Daughter denies that patient has any difficulty with swallowing (ie: no observed coughing, throat clearing when eating). She told SLP that her main concern is patient is malnourished and she "eats half of what you bring her if your'e lucky" and that it is a struggle to get her to eat. Daughter also reported that she and other family members know what kinds of foods patient can tolerate. Mainly, this involves soft solids as patient is edentuolous for past two years. SLP is recommending to liberalize patient's diet consistency to regular texture solids to allow for more selection and family can order foods they know she will tolerate. No further SLP intervention indicated at this time. SLP Visit Diagnosis: Dysphagia, unspecified (R13.10)    Aspiration Risk  No limitations;Mild aspiration risk;Risk for inadequate nutrition/hydration    Diet Recommendation Regular;Thin liquid   Liquid Administration via: Cup;Straw Medication Administration: Other (Comment) (as tolerated) Supervision: Full supervision/cueing for compensatory strategies;Staff to assist with self feeding  Compensations: Small sips/bites Postural Changes: Seated upright at 90 degrees    Other  Recommendations Oral Care Recommendations: Oral care BID    Recommendations for follow up therapy are one component of a multi-disciplinary discharge planning process, led by the attending  physician.  Recommendations may be updated based on patient status, additional functional criteria and insurance authorization.  Follow up Recommendations No SLP follow up      Assistance Recommended at Discharge    Functional Status Assessment Patient has not had a recent decline in their functional status  Frequency and Duration   N/A         Prognosis   N/A     Swallow Study   General Date of Onset: 11/24/22 HPI: Patient is an 81 y.o. female with PMH: HTN, DM-2,  CKD IIIb, dementia. She presented to the hospital with bilateral leg pain secondary to chronic severe critical limb ischemia. She was afebrile with mild leukocytosis on arrival with low concern for sepsis. SLP swallow evaluation ordered secondary to patient with difficulty taking medications, difficulty with solid foods and family reporting h/o esophageal dysphagia (stricture?). Type of Study: Bedside Swallow Evaluation Previous Swallow Assessment: none found Diet Prior to this Study: Dysphagia 3 (soft);Thin liquids Temperature Spikes Noted: No Respiratory Status: Room air History of Recent Intubation: No Behavior/Cognition: Lethargic/Drowsy Oral Cavity Assessment: Within Functional Limits Oral Care Completed by SLP: No Oral Cavity - Dentition: Edentulous Self-Feeding Abilities: Total assist Baseline Vocal Quality: Low vocal intensity Volitional Cough: Cognitively unable to elicit Volitional Swallow: Unable to elicit    Oral/Motor/Sensory Function Overall Oral Motor/Sensory Function: Other (comment) (did not fully assess secondary to patient lethargy but appeared with generalized weakness)   Ice Chips     Thin Liquid Thin Liquid: Impaired Presentation: Straw Oral Phase Impairments: Reduced labial seal;Reduced lingual movement/coordination Oral Phase Functional Implications: Other (comment) (difficulty in drawing liquid up through straw) Pharyngeal  Phase Impairments: Other (comments) (no observed s/s)    Nectar  Thick     Honey Thick     Puree Puree: Not tested   Solid     Solid: Not tested      Sonia Baller, MA, CCC-SLP Speech Therapy

## 2022-11-24 NOTE — Progress Notes (Signed)
OT Cancellation Note  Patient Details Name: EVALEEN SANT MRN: [id number] DOB: [birth year]   Cancelled Treatment:    Reason Eval/Treat Not Completed: Pain limiting ability to participate (Per RN pt very painful, awaiting palliative care meeting at 12:00pm. Will follow up for OT evaluation as able/schedule permitting.)  Renaye Rakers, OTD, OTR/L [facility]Chat [facility] [phone] [place] 11/24/2022, 11:50 AM

## 2022-11-24 NOTE — Progress Notes (Signed)
Called daughter Wendy [name] to gain consent for blood administration. Sought family consent given Wendy [name]'s impaired cognition due to dementia. Discussed risks and benefits. She gives permission to proceed.   At the end of the call, she clarifies she is the sister in [place]. The other sister has been here in town, but her number is not in the chart.   Ezequiel Essex, MD

## 2022-11-24 NOTE — Progress Notes (Signed)
Had a meeting with palliative and with patient's daughter Wendy [name] and other daughter Wendy [name] was on the phone this afternoon for about an hour and 15 minutes.  Discussed in depth about the patient, and her health over the past couple of years.  States that her dementia has waxed and waned but since being in the hospital she has been more out of it which daughter feels may be her increased pain, or the medication.  She states that her mom has a living will but as far as she knows has not indicated her code status or wishes. There is also not a designated HCPOA. She does feel that based on experiences with her husband's death and with son's heart condition and needing to be coded 7 times that she would want resuscitation because she never understand why any of her family would not want that.   Wendy [name] was able to discuss in detail what vascular surgery has told her about her mom's lower extremities and inability to revascularize the left side and the recommendation for above knee amputation on the left side and likely the right side both palliative to help with the pain. The patient has mentioned that she does not want the amputation but Wendy [name] states its hard to know what exactly she wants as she is not at her baseline mentally and has said a lot of things that have not made sense.   Discussed that we will meet again on Monday with Palliative, Vascular surgery and the family to further discuss prognosis and goals of care. Sister Wendy [name] will not be here in time but is fine to be on the phone. Brother should also be available though family makes it clear that he would not want to make any decisions independently.   Please see palliative note to follow as well

## 2022-11-25 DIAGNOSIS — L8990 Pressure ulcer of unspecified site, unspecified stage: Secondary | ICD-10-CM

## 2022-11-25 DIAGNOSIS — I70223 Atherosclerosis of native arteries of extremities with rest pain, bilateral legs: Secondary | ICD-10-CM | POA: Diagnosis not present

## 2022-11-25 DIAGNOSIS — D62 Acute posthemorrhagic anemia: Secondary | ICD-10-CM | POA: Diagnosis not present

## 2022-11-25 DIAGNOSIS — E1122 Type 2 diabetes mellitus with diabetic chronic kidney disease: Secondary | ICD-10-CM

## 2022-11-25 DIAGNOSIS — Z515 Encounter for palliative care: Secondary | ICD-10-CM | POA: Diagnosis not present

## 2022-11-25 DIAGNOSIS — N181 Chronic kidney disease, stage 1: Secondary | ICD-10-CM

## 2022-11-25 LAB — TYPE AND SCREEN
ABO/RH(D): O POS
Antibody Screen: POSITIVE
DAT, IgG: NEGATIVE
Donor AG Type: NEGATIVE
Donor AG Type: NEGATIVE
PT AG Type: NEGATIVE
Unit division: 0
Unit division: 0

## 2022-11-25 LAB — BPAM RBC
Blood Product Expiration Date: 202402022359
Blood Product Expiration Date: 202402022359
ISSUE DATE / TIME: 202312300821
ISSUE DATE / TIME: 202312302252
Unit Type and Rh: 5100
Unit Type and Rh: 5100

## 2022-11-25 LAB — CBC
HCT: 26.1 % — ABNORMAL LOW (ref 36.0–46.0)
Hemoglobin: 8.4 g/dL — ABNORMAL LOW (ref 12.0–15.0)
MCH: 25 pg — ABNORMAL LOW (ref 26.0–34.0)
MCHC: 32.2 g/dL (ref 30.0–36.0)
MCV: 77.7 fL — ABNORMAL LOW (ref 80.0–100.0)
Platelets: 344 10*3/uL (ref 150–400)
RBC: 3.36 MIL/uL — ABNORMAL LOW (ref 3.87–5.11)
RDW: 16.4 % — ABNORMAL HIGH (ref 11.5–15.5)
WBC: 14.6 10*3/uL — ABNORMAL HIGH (ref 4.0–10.5)
nRBC: 0 % (ref 0.0–0.2)

## 2022-11-25 LAB — GLUCOSE, CAPILLARY
Glucose-Capillary: 166 mg/dL — ABNORMAL HIGH (ref 70–99)
Glucose-Capillary: 247 mg/dL — ABNORMAL HIGH (ref 70–99)
Glucose-Capillary: 260 mg/dL — ABNORMAL HIGH (ref 70–99)
Glucose-Capillary: 253 mg/dL — ABNORMAL HIGH (ref 70–99)

## 2022-11-25 LAB — BASIC METABOLIC PANEL
Anion gap: 4 — ABNORMAL LOW (ref 5–15)
BUN: 14 mg/dL (ref 8–23)
CO2: 20 mmol/L — ABNORMAL LOW (ref 22–32)
Calcium: 8.2 mg/dL — ABNORMAL LOW (ref 8.9–10.3)
Chloride: 114 mmol/L — ABNORMAL HIGH (ref 98–111)
Creatinine, Ser: 1.47 mg/dL — ABNORMAL HIGH (ref 0.44–1.00)
GFR, Estimated: 35 mL/min — ABNORMAL LOW (ref >60)
Glucose, Bld: 172 mg/dL — ABNORMAL HIGH (ref 70–99)
Potassium: 5 mmol/L (ref 3.5–5.1)
Sodium: 138 mmol/L (ref 135–145)

## 2022-11-25 LAB — MAGNESIUM: Magnesium: 1.8 mg/dL (ref 1.7–2.4)

## 2022-11-25 MED ORDER — RIVASTIGMINE 9.5 MG/24HR TD PT24
9.5000 mg | MEDICATED_PATCH | Freq: Every day | TRANSDERMAL | Status: DC
  Administered 2022-11-25 – 2022-12-04 (×10): 9.5 mg via TRANSDERMAL
  Filled 2022-11-25 (×10): qty 1

## 2022-11-25 MED ORDER — TRAMADOL HCL 50 MG PO TABS
50.0000 mg | ORAL_TABLET | Freq: Four times a day (QID) | ORAL | Status: DC | PRN
  Administered 2022-11-25 – 2022-11-27 (×5): 50 mg via ORAL
  Filled 2022-11-25 (×6): qty 1

## 2022-11-25 MED ORDER — ASPIRIN 81 MG PO TBEC
81.0000 mg | DELAYED_RELEASE_TABLET | Freq: Every day | ORAL | Status: DC
  Administered 2022-11-26 – 2022-12-04 (×9): 81 mg via ORAL
  Filled 2022-11-25 (×10): qty 1

## 2022-11-25 NOTE — Progress Notes (Addendum)
Progress Note    11/25/2022 8:46 AM 2 Days Post-Op  Subjective:  more talkative this morning. Initially only one in the room but later son arrived. Says still having pain in both feet, left > right. She expressed some understanding of her need for amputation. She says it is a lot to think about but she has hopes to get to 83. She plans to talk to her family and make a decision over the coming days   Vitals:   11/25/22 0356 11/25/22 0800  BP: (!) 150/64   Pulse: 82   Resp: 20 (!) 22  Temp: 98.7 F (37.1 C) 98.7 F (37.1 C)  SpO2: 100%    Physical Exam: Cardiac:  regular Lungs:  non labored Incisions:  left groin left leg incisions are tender but well appearing and intact. No swelling or hematoma Extremities:  right peroneal signal, right foot warm. Left no doppler signals. Left foot cool. Dry gangrene present Abdomen:  flat, soft Neurologic: alert   CBC    Component Value Date/Time   WBC 14.6 (H) 11/25/2022 0646   RBC 3.36 (L) 11/25/2022 0646   HGB 8.4 (L) 11/25/2022 0646   HGB 9.0 (L) 05/03/2020 1005   HCT 26.1 (L) 11/25/2022 0646   HCT 28.9 (L) 05/03/2020 1005   PLT 344 11/25/2022 0646   PLT 476 (H) 05/03/2020 1005   MCV 77.7 (L) 11/25/2022 0646   MCV 78 (L) 05/03/2020 1005   MCH 25.0 (L) 11/25/2022 0646   MCHC 32.2 11/25/2022 0646   RDW 16.4 (H) 11/25/2022 0646   RDW 15.5 (H) 05/03/2020 1005   LYMPHSABS 1.4 11/20/2022 1638   LYMPHSABS 1.8 05/03/2020 1005   MONOABS 1.2 (H) 11/20/2022 1638   EOSABS 0.0 11/20/2022 1638   EOSABS 0.1 05/03/2020 1005   BASOSABS 0.0 11/20/2022 1638   BASOSABS 0.1 05/03/2020 1005    BMET    Component Value Date/Time   NA 138 11/25/2022 0646   NA 140 07/26/2020 1506   K 5.0 11/25/2022 0646   CL 114 (H) 11/25/2022 0646   CO2 20 (L) 11/25/2022 0646   GLUCOSE 172 (H) 11/25/2022 0646   BUN 14 11/25/2022 0646   BUN 37 (H) 07/26/2020 1506   CREATININE 1.47 (H) 11/25/2022 0646   CALCIUM 8.2 (L) 11/25/2022 0646   CALCIUM 10.1  06/07/2022 0938   GFRNONAA 35 (L) 11/25/2022 0646   GFRAA 32 (L) 08/02/2020 1410    INR    Component Value Date/Time   INR 1.2 11/22/2022 0628     Intake/Output Summary (Last 24 hours) at 11/25/2022 0846 Last data filed at 11/25/2022 0358 Gross per 24 hour  Intake 388 ml  Output 1100 ml  Net -712 ml     Assessment/Plan:  81 y.o. female is s/p  Left femoral to peroneal artery bypass graft with 6 mm external ring PTFE 2 Days Post-Op, Thrombectomy of left femoral to peroneal artery bypass graft  2 Days Post-Op   Better mentation this morning. More talkative and expressive of her wishes No doppler signal in left lower extremity. Left foot cool Continues to have  pain in left leg/ foot> right Her incisions are intact and well appearing in left groin and left medial leg Left calf soft Left foot with dry gangrenous changes to forefoot and toes ABLA hgb 8.4 post transfusion. Was 6.8 this morning. 1 Unit PRBC given Unfortunately her bypass has not remained open due to poor outflow  She would best be managed with a left above knee  amputation for best healing Her right lower extremity is also at risk and she will likely require bypass vs amputation as well Appreciate Palliative care assistance  Plan is to continue to have ongoing discussion with family and patient over next several days to determine amputation vs comfort care    Karoline Caldwell, [facility] [phone] 11/25/2022 8:46 AM  VASCULAR STAFF ADDENDUM: I have independently interviewed and examined the patient. I agree with the above.   I had a long discussion with the patient's daughter today.  She was upset because she felt like no one had shared that her mother's longevity was limited.  I explained that all patients with chronic limb threatening ischemia have a 25% mortality rate, regardless of other factors.  I explained that patients undergoing an above knee amputation for peripheral  arterial disease often do not live greater than 12 months. Given her advanced age, frailty, and comorbidities, I would not expect her to live > 12 months, and I suspect she would not live > 6 months. I explained that coronary artery disease or cerebrovascular disease is the typical cause of death for these patients.  She is understanding. She is planning to meet with palliative care tomorrow to make a plan going forward. I again reviewed our two main options: above knee amputation on the left; or transition to comfort measures. I explained I may not be able to attend the meeting tomorrow, but would if I could.  The patient's daughter would like all communication to be run through her or her sister.   Yevonne Aline. Stanford Breed, MD [facility] Phone Number: [phone] 11/25/2022 12:26 PM

## 2022-11-25 NOTE — Assessment & Plan Note (Addendum)
Hgb trend is stable. - Continue SubQ heparin for DVT ppx - Daily CBC

## 2022-11-25 NOTE — Progress Notes (Signed)
OT Cancellation Note  Patient Details Name: Wendy [name] MRN: [id number] DOB: 07-Aug-1941   Cancelled Treatment:    Reason Eval/Treat Not Completed: Other (comment) (Per protocol, OT to sign off and await new orders when appropriate.) Per discussion with RN, pt's family is present and they are having discussion about [place]. Per chart, the pt's medical team, family and the patient are determining LE amputation vs. comfort care. Please re-order OT at a later date if appropriate for participation.   Elliot Cousin 11/25/2022, 11:26 AM

## 2022-11-25 NOTE — Progress Notes (Signed)
Palliative Medicine Inpatient Follow Up Note   HPI: Wendy [name] is a 81 y.o. female that presented with bilateral leg pain found to have worsening chronic critical limb ischemia s/p stent with vascular surgery. PMH includes HTN, CKD, T2DM, Sjogren syndrome, anemia, GERD, HLD      Palliative care has been asked to get involved to support further goals of care conversations in the setting of decisions related to L AKA.   Today's Discussion 11/25/2022  *Please note that this is a verbal dictation therefore any spelling or grammatical errors are due to the "[place] [employer]" system interpretation.  Chart reviewed inclusive of vital signs, progress notes, laboratory results, and diagnostic images.   Received a secure chat from patient RN that patient's pain was uncontrolled and she requested an evaluation.   I met with Wendy [name] at bedside in the presence of her son, Wendy [name]. She is vocalizing terribly discomfort in her BLE. She is far more alert this morning and aware of person, place, and some of situation.   Per discussion with patients son the lack of receiving opioids has made her more awake. He would prefer if we can give her something that is less sedating though addresses her pain. He shares that she had prior been on tramadol in her home which did work well and did not cause AMS. I shared that I would reach out to the primary team and pharmacy though I did agree that this sounded like a good option.  I communicated with the primary team and [facility] who shared that the patient is safe to receive tramadol. This was ordered shortly thereafter.  I spoke with patients daughter, Wendy [name] over the phone. The plan at this time is for a meeting tomorrow with the vascular surgery team to determine what to do moving forward. Patients family want to hear directly from Wendy [name] regarding her wishes.   Questions and concerns addressed/Palliative Support Provided.   Objective  Assessment: Vital Signs Vitals:   11/25/22 0356 11/25/22 0800  BP: (!) 150/64   Pulse: 82   Resp: 20 (!) 22  Temp: 98.7 F (37.1 C) 98.7 F (37.1 C)  SpO2: 100%     Intake/Output Summary (Last 24 hours) at 11/25/2022 1251 Last data filed at 11/25/2022 0358 Gross per 24 hour  Intake 388 ml  Output 1100 ml  Net -712 ml   Last Weight  Most recent update: 11/22/2022 10:51 AM    Weight  54.4 kg (119 lb 14.9 oz)            Gen:  Elderly AA F in NAD HEENT: moist mucous membranes CV: Regular rate and rhythm  PULM:  On RA breathing is even and nonlabored ABD: soft/nontender  EXT: (+) L toes gangrene Neuro: Alert and oriented to self  SUMMARY OF RECOMMENDATIONS   FULL CODE   Patients family would like to meet with the vascular surgery team on Monday in the presence of the patient to determine what the patient wants. If she does not have decision making capacity at that time family understands that they may need to make decisions.  Started tramadol 3m PO Q6H PRN pain   Ongoing Palliative support  Billing based on MDM: High ______________________________________________________________________________________ [place] Cell Phone: [phone] utilize secure chat with additional questions, if there is no response within 30 minutes please call the above phone number  Palliative Medicine Team providers are available by phone from [address]  to 7pm daily and can be reached through the team cell phone.  Should this patient require assistance outside of these hours, please call the patient's attending physician.

## 2022-11-25 NOTE — Progress Notes (Signed)
Daily Progress Note Intern Pager: [phone]  Patient name: Wendy [name] Medical record number: [id number] Date of birth: 11/05/1941 Age: 81 y.o. Gender: female  Primary Care Provider: Default, Provider, MD Consultants: Vascular surgery, Podiatry   Code Status: Full    Pt Overview and Major Events to Date:  12/27 - Admitted, vascular surg consulted 12/28 - L leg femoral to peroneal artery bypass, podiatry consulted by vasc  Assessment and Plan:  Wendy [name] is a 81 y.o. female that presented with bilateral leg pain found to have worsening chronic critical limb ischemia s/p stent that has occluded and not able to be revascularized. PMH includes HTN, CKD, T2DM, Sjogren syndrome, anemia, GERD, HLD     * Critical limb ischemia of both lower extremities (HCC) S/p left leg femoral to peroneal artery bypass. Bypass graft occluded likely due to no outflow and not able to be revised per vascular. Will likely require amputation. Family concerned about pain- does not want her to have too much narcotics.   - Vascular following, appreciate recs - Podiatry following, appreciate recs - Heparin discontinued per vascular. Restarted ASA - Continue Vanc and CTX  - Monitor CBC - Bowel regiment: Miralax and senna - Pain Management  Tylenol '650mg'$  q6h   Gabapentin '200mg'$  TID  Oxy 2.'5mg'$  q4h PRN  Tramadol '50mg'$  q 6h prn   Dilaudid .'5mg'$  q 3h prn severe pain   Acute blood loss anemia Hgb 8.4 today after hgb 6.8 yesterday requiring 1unit transfusion. Likely in the setting of her procedure.  - continue to monitor with am CBC  Hypomagnesemia Stable. Mag 1.8 today s/p repletion yesterday - Continue to monitor with AM Mag  Hyperkalemia Stable. K this morning is 5.0.  - Continue to monitor with am BMP  Essential hypertension Over past 24h has ranged from 118-151/52-66  - Continue home Metoprolol '25mg'$  daily  - Holding Irbesartan due to hyperkalemia - Holding home clonidine  Type 2 diabetes  mellitus with kidney complication, without long-term current use of insulin (HCC) Fasting CBG this am 166. Received 6 Units of Novolog in the last 24h, will continue to monitor.  - CBGs QAC and QHS - sensitive SSI   Goals of care Had a long discussion with palliative and family yesterday. Will continue full measures as of now. Another discussion scheduled for Monday with Vascular, palliative and family. Time TBD.  - palliative following - RD consult   Chronic conditions Dementia- continued on Rivastigmine 9.'5mg'$  transdermal patch, Memantine '10mg'$  daily  Neuropathy- Gabapentin '200mg'$  TID HLD- Crestor '10mg'$  daily    FEN/GI: Dys 3 diet  PPx: Heparin d/c per VVS  Dispo:SNF pending improvement and continued work up vs goals of care discussion    Subjective:  No acute events overnight. Daughter bedside and notes significant improvement. Patient much more talkative and alert this morning. She has been able to eat breakfast. Able to talk to me about why she is in the hospital and her worry about her amputations. Her pain is better controlled. She mentions that her son was there earlier and the pain regimen was discussed with him but it goes in one ear and out the other. She is able to tell me that she will need help around the clock when she leaves the hospital. She asks if I believe in God and if so to pray for her strength and healing.   Daughter does express frustration that one of the nurse techs blamed the patient for removing her foley and  felt she yelled at the patient. Does not want that particular nurse tech in the room again.   She is hoping to be notified about what time the family meeting will be tomorrow so she can make sure she is here for it. Denies any other questions or concerns.   Objective: Temp:  [98.7 F (37.1 C)-99.8 F (37.7 C)] 98.7 F (37.1 C) (12/31 0800) Pulse Rate:  [70-89] 82 (12/31 0356) Resp:  [16-22] 22 (12/31 0800) BP: (118-151)/(52-66) 150/64 (12/31  0356) SpO2:  [99 %-100 %] 100 % (12/31 0356) Physical Exam: General: alert, pleasant and talkative, NAD Cardiovascular: RRR  Respiratory: CTAB normal WOB  Abdomen: soft, non distended  Extremities: Dry gangrene on LLE. Both feet cool bilaterally   Laboratory: Most recent CBC Lab Results  Component Value Date   WBC 14.6 (H) 11/25/2022   HGB 8.4 (L) 11/25/2022   HCT 26.1 (L) 11/25/2022   MCV 77.7 (L) 11/25/2022   PLT 344 11/25/2022   Most recent BMP    Latest Ref Rng & Units 11/25/2022    6:46 AM  BMP  Glucose 70 - 99 mg/dL 172   BUN 8 - 23 mg/dL 14   Creatinine 0.44 - 1.00 mg/dL 1.47   Sodium 135 - 145 mmol/L 138   Potassium 3.5 - 5.1 mmol/L 5.0   Chloride 98 - 111 mmol/L 114   CO2 22 - 32 mmol/L 20   Calcium 8.9 - 10.3 mg/dL 8.2     Imaging/Diagnostic Tests:  None new  Shary Key, DO 11/25/2022, 12:09 PM  PGY-3, [place] Intern pager: [phone], text pages welcome Secure chat group [place]

## 2022-11-26 DIAGNOSIS — Z515 Encounter for palliative care: Secondary | ICD-10-CM

## 2022-11-26 DIAGNOSIS — Z7189 Other specified counseling: Secondary | ICD-10-CM | POA: Diagnosis not present

## 2022-11-26 DIAGNOSIS — I70223 Atherosclerosis of native arteries of extremities with rest pain, bilateral legs: Secondary | ICD-10-CM

## 2022-11-26 LAB — BASIC METABOLIC PANEL
Anion gap: 9 (ref 5–15)
Anion gap: 5 (ref 5–15)
BUN: 14 mg/dL (ref 8–23)
BUN: 15 mg/dL (ref 8–23)
CO2: 19 mmol/L — ABNORMAL LOW (ref 22–32)
CO2: 20 mmol/L — ABNORMAL LOW (ref 22–32)
Calcium: 8.5 mg/dL — ABNORMAL LOW (ref 8.9–10.3)
Calcium: 8.5 mg/dL — ABNORMAL LOW (ref 8.9–10.3)
Chloride: 108 mmol/L (ref 98–111)
Chloride: 108 mmol/L (ref 98–111)
Creatinine, Ser: 1.36 mg/dL — ABNORMAL HIGH (ref 0.44–1.00)
Creatinine, Ser: 1.41 mg/dL — ABNORMAL HIGH (ref 0.44–1.00)
GFR, Estimated: 39 mL/min — ABNORMAL LOW (ref >60)
GFR, Estimated: 37 mL/min — ABNORMAL LOW (ref >60)
Glucose, Bld: 202 mg/dL — ABNORMAL HIGH (ref 70–99)
Glucose, Bld: 207 mg/dL — ABNORMAL HIGH (ref 70–99)
Potassium: 5.3 mmol/L — ABNORMAL HIGH (ref 3.5–5.1)
Potassium: 5.1 mmol/L (ref 3.5–5.1)
Sodium: 136 mmol/L (ref 135–145)
Sodium: 133 mmol/L — ABNORMAL LOW (ref 135–145)

## 2022-11-26 LAB — CBC
HCT: 31.5 % — ABNORMAL LOW (ref 36.0–46.0)
Hemoglobin: 10.1 g/dL — ABNORMAL LOW (ref 12.0–15.0)
MCH: 25 pg — ABNORMAL LOW (ref 26.0–34.0)
MCHC: 32.1 g/dL (ref 30.0–36.0)
MCV: 78 fL — ABNORMAL LOW (ref 80.0–100.0)
Platelets: 400 10*3/uL (ref 150–400)
RBC: 4.04 MIL/uL (ref 3.87–5.11)
RDW: 16.7 % — ABNORMAL HIGH (ref 11.5–15.5)
WBC: 17.7 10*3/uL — ABNORMAL HIGH (ref 4.0–10.5)
nRBC: 0.1 % (ref 0.0–0.2)

## 2022-11-26 LAB — GLUCOSE, CAPILLARY
Glucose-Capillary: 213 mg/dL — ABNORMAL HIGH (ref 70–99)
Glucose-Capillary: 176 mg/dL — ABNORMAL HIGH (ref 70–99)
Glucose-Capillary: 226 mg/dL — ABNORMAL HIGH (ref 70–99)
Glucose-Capillary: 260 mg/dL — ABNORMAL HIGH (ref 70–99)

## 2022-11-26 LAB — MAGNESIUM: Magnesium: 1.8 mg/dL (ref 1.7–2.4)

## 2022-11-26 MED ORDER — OXYCODONE HCL 5 MG PO TABS
2.5000 mg | ORAL_TABLET | ORAL | Status: DC | PRN
  Administered 2022-11-26: 2.5 mg via ORAL
  Filled 2022-11-26: qty 1

## 2022-11-26 MED ORDER — ACETAMINOPHEN 325 MG PO TABS: 650.0000 mg | ORAL_TABLET | Freq: Four times a day (QID) | ORAL | Status: DC | PRN

## 2022-11-26 MED ORDER — HYDROMORPHONE HCL 1 MG/ML IJ SOLN
0.5000 mg | INTRAMUSCULAR | Status: DC | PRN
  Administered 2022-11-26 – 2022-11-30 (×5): 0.5 mg via INTRAVENOUS
  Filled 2022-11-26 (×6): qty 0.5

## 2022-11-26 NOTE — Progress Notes (Addendum)
Daily Progress Note Intern Pager: [phone]  Patient name: Wendy [name] Medical record number: [id number] Date of birth: [birth year] Age: 82 y.o. Gender: female  Primary Care Provider: Default, Provider, MD Consultants: Palliative, Vascular Surgery, Podiatry Code Status: Full  Pt Overview and Major Events to Date:  12/27 - Admitted, vascular surg consulted 12/28 - L leg femoral to peroneal artery bypass, podiatry consulted by vasc  Assessment and Plan: ERIE SICA is a 82 y.o. female that presented with bilateral leg pain found to have worsening chronic critical limb ischemia s/p stent that has occluded and not able to be revascularized. PMH includes HTN, CKD, T2DM, Sjogren syndrome, anemia, GERD, HLD    * Critical limb ischemia of both lower extremities (HCC) Concern for concomitant cellulitis, on antibiotics. Goals of care meeting with vascular and palliative is hopeful for today to determine how to proceed as patient's graft occluded and requires further intervention  (anticipating L AKA). - Vascular following, appreciate recs - Podiatry following, appreciate recs - Continue Vanc and ceftriaxone - Miralax and Senna - Daily ASA - Pain Management:   Gabapentin '200mg'$  TID  Oxycodone 2.'5mg'$  q4h PRN  Tramadol '50mg'$  q6h PRN  Dilaudid 0.'5mg'$  q3h PRN for severe pain  Acute blood loss anemia Hgb improved today at 10.1. Completed last transfusion on 12/31. - Monitor with AM CBC  Hypomagnesemia Mag 1.8 - Continue to monitor with AM Mag checks  Hyperkalemia Hyperkalemic to 5.3 this morning. - 2PM BMP - Trend AM BMP  Essential hypertension BP remains elevated in the 160s on average - Continue home Metoprolol '25mg'$  daily  - Holding Irbesartan due to hyperkalemia - Holding home clonidine  Type 2 diabetes mellitus with kidney complication, without long-term current use of insulin (HCC) Received 8 Units of Novolog in the last 24h, given that we are unclear if patient will end  up having further procedures we will continue only sliding scale in insulin. - CBGs QAC and QHS - sensitive SSI    FEN/GI: Regular diet PPx: ASA per vascular Dispo:SNF pending clinical improvement . Barriers include goals of care and further vascular interventions.   Subjective:  Patient reports that her legs are still causing her significant pain, per nursing patient due for pain medication.   Objective: Temp:  [98 F (36.7 C)-100.2 F (37.9 C)] 99.7 F (37.6 C) (01/01 0812) Pulse Rate:  [83-100] 100 (01/01 0812) Resp:  [17-20] 17 (01/01 0812) BP: (154-176)/(69-83) 154/80 (01/01 0812) SpO2:  [98 %-100 %] 99 % (01/01 2637) Physical Exam: General: elderly female, NAD, supine in bed, son at bedside Cardiovascular: RRR Respiratory: breathing comfortably on room air, CTAB Abdomen: soft, non-tender, non-distended Extremities: dry gangrene of left foot, unable to palpate peripheral pulses  Laboratory: Most recent CBC Lab Results  Component Value Date   WBC 17.7 (H) 11/26/2022   HGB 10.1 (L) 11/26/2022   HCT 31.5 (L) 11/26/2022   MCV 78.0 (L) 11/26/2022   PLT 400 11/26/2022   Most recent BMP    Latest Ref Rng & Units 11/26/2022    6:00 AM  BMP  Glucose 70 - 99 mg/dL 202   BUN 8 - 23 mg/dL 14   Creatinine 0.44 - 1.00 mg/dL 1.36   Sodium 135 - 145 mmol/L 136   Potassium 3.5 - 5.1 mmol/L 5.3   Chloride 98 - 111 mmol/L 108   CO2 22 - 32 mmol/L 19   Calcium 8.9 - 10.3 mg/dL 8.5      Leoda Smithhart, DO  11/26/2022, 9:07 AM  PGY-3, [place] Intern pager: [phone], text pages welcome Secure chat group [place]

## 2022-11-26 NOTE — Progress Notes (Signed)
Palliative Medicine Progress Note   Patient Name: Wendy [name]       Date: 11/26/2022 DOB: 11-13-1941  Age: 82 y.o. MRN#: [id number] Attending Physician: Leeanne Rio, MD Primary Care Physician: Default, Provider, MD Admit Date: 11/20/2022    HPI/Patient Profile: Wendy [name] is a 82 y.o. female that presented with bilateral leg pain found to have worsening chronic critical limb ischemia s/p stent with vascular surgery. PMH includes HTN, CKD, T2DM, Sjogren syndrome, anemia, GERD, HLD      Palliative care has been asked to get involved to support further goals of care conversations in the setting of decisions related to L AKA.   Subjective: Chart reviewed and patient assessed at bedside. Se is awake and alert but is not able to meaningfully participate in complex decision making.   Family Meeting: Met at bedside today with patient, daughter/Wendy [name], and patient's brother. Another daughter/Wendy [name] was on speaker phone.  Providers present were myself, Dr. Oleh Genin (Family Medicine), and Dr. Stanford Breed with vascular surgery. Reviewed that patient has critical limb ischemia with gangrene. Discussed that from a surgical perspective, the options are palliative left AKA versus comfort care. Discussed that left AKA will probably help with pain control , but survival will likely not be extended > 12 months due to patient's severe underlying vascular disease.  Family was provided opportunity to have all questions answered.   Wendy [name] later reaches out to PMT and reports that family has decided to proceed with left AKA. She has questions/concerns about options for care after discharge from the hospital. She has concerns that patient will not have meaningful improvement with rehab. I discussed with  Wendy [name] my recommendation to consider the addition of hospice support at home. I explained that hospice would provide support in the setting of advanced/end-stage illness and could personal care for patient as well as  assistance with symptom management when she further declines. Wendy [name] understands and will consider. She asks that I speak with her sister Wendy [name] tomorrow.    Objective:  Physical Exam Vitals reviewed.  Constitutional:      General: She is not in acute distress.    Appearance: She is ill-appearing.     Comments: Frail  Pulmonary:     Effort: Pulmonary effort is normal.  Neurological:     Mental Status: She is alert.  Psychiatric:        Cognition and Memory: Cognition is impaired.              Palliative Medicine Assessment & Plan   Assessment: Principal Problem:   Critical limb ischemia of both lower extremities (HCC) Active Problems:   Type 2 diabetes mellitus with kidney complication, without long-term current use of insulin (HCC)   Essential hypertension   Vascular complication   Hyperkalemia   Hypomagnesemia   Acute lower extremity ischemia   Cellulitis of foot   Acute blood loss anemia   Pressure injury of skin    Recommendations/Plan: Full code  Family has decided to proceed with left AKA Daughter has concerns about rehab - discussed that patient would be eligible for hospice Ongoing palliative support   Prognosis:  < 6 months would not be surprising  Discharge Planning: To Be Determined    Thank you for allowing the Palliative Medicine Team to assist in the care of this patient.   Greater than 50%  of this time was spent counseling and coordinating care related to the above assessment and plan.  Total time: 81 minutes   Lavena Bullion, NP Palliative Medicine   Please contact Palliative Medicine Team phone at [phone] for questions and concerns.  For individual provider, see AMION.

## 2022-11-26 NOTE — Progress Notes (Signed)
[facility] [place] PROGRESS NOTE  ASSESSMENT / PLAN: Wendy [name] is a 82 y.o. female with thrombosed left common femoral to peroneal bypass. No further options for revascularization. Difficult situation. Palliative care discussions ongoing. Plans for meeting with family today. I will attend if able. Essentially, two options available from a surgical perspective: hospice care, or palliative left above knee amputation. I do not think her survival will be meaningfully extended by a left above knee amputation, but it will probably help with pain control. I do not expect her to survive >12 months, regardless of decision made today.   SUBJECTIVE: Resting comfortably. Wendy [name] at bedside.  OBJECTIVE: BP (!) 173/83 (BP Location: Right Arm)   Pulse 93   Temp 100.2 F (37.9 C) (Oral)   Resp 20   Ht '5\' 4"'$  (1.626 m)   Wt 54.4 kg   SpO2 98%   BMI 20.59 kg/m   Intake/Output Summary (Last 24 hours) at 11/26/2022 0802 Last data filed at 11/26/2022 4920 Gross per 24 hour  Intake 200 ml  Output 1050 ml  Net -850 ml    No distress. Regular rate and rhythm Unlabored breathing Feet unchanged in appearance. L foot cool.     Latest Ref Rng & Units 11/26/2022    6:00 AM 11/25/2022    6:46 AM 11/24/2022    9:26 PM  CBC  WBC 4.0 - 10.5 K/uL 17.7  14.6    Hemoglobin 12.0 - 15.0 g/dL 10.1  8.4  6.8   Hematocrit 36.0 - 46.0 % 31.5  26.1  23.0   Platelets 150 - 400 K/uL 400  344          Latest Ref Rng & Units 11/26/2022    6:00 AM 11/25/2022    6:46 AM 11/24/2022    2:46 PM  CMP  Glucose 70 - 99 mg/dL 202  172  247   BUN 8 - 23 mg/dL '14  14  14   '$ Creatinine 0.44 - 1.00 mg/dL 1.36  1.47  1.54   Sodium 135 - 145 mmol/L 136  138  137   Potassium 3.5 - 5.1 mmol/L 5.3  5.0  5.4   Chloride 98 - 111 mmol/L 108  114  113   CO2 22 - 32 mmol/L '19  20  18   '$ Calcium 8.9 - 10.3 mg/dL 8.5  8.2  8.1     Estimated Creatinine Clearance: 26.9 mL/min (A) (by C-G formula based on SCr of  1.36 mg/dL (H)).  Wendy [name]. Stanford Breed, MD [facility] Phone Number: [phone] 11/26/2022 8:02 AM

## 2022-11-27 ENCOUNTER — Inpatient Hospital Stay (HOSPITAL_COMMUNITY): Payer: Medicare PPO

## 2022-11-27 DIAGNOSIS — Z7189 Other specified counseling: Secondary | ICD-10-CM | POA: Diagnosis not present

## 2022-11-27 DIAGNOSIS — Z515 Encounter for palliative care: Secondary | ICD-10-CM | POA: Diagnosis not present

## 2022-11-27 DIAGNOSIS — R4189 Other symptoms and signs involving cognitive functions and awareness: Secondary | ICD-10-CM | POA: Diagnosis not present

## 2022-11-27 DIAGNOSIS — I70223 Atherosclerosis of native arteries of extremities with rest pain, bilateral legs: Secondary | ICD-10-CM | POA: Diagnosis not present

## 2022-11-27 DIAGNOSIS — R509 Fever, unspecified: Secondary | ICD-10-CM

## 2022-11-27 LAB — BASIC METABOLIC PANEL
Anion gap: 9 (ref 5–15)
Anion gap: 9 (ref 5–15)
BUN: 15 mg/dL (ref 8–23)
BUN: 16 mg/dL (ref 8–23)
CO2: 21 mmol/L — ABNORMAL LOW (ref 22–32)
CO2: 20 mmol/L — ABNORMAL LOW (ref 22–32)
Calcium: 8.6 mg/dL — ABNORMAL LOW (ref 8.9–10.3)
Calcium: 8.6 mg/dL — ABNORMAL LOW (ref 8.9–10.3)
Chloride: 105 mmol/L (ref 98–111)
Chloride: 104 mmol/L (ref 98–111)
Creatinine, Ser: 1.42 mg/dL — ABNORMAL HIGH (ref 0.44–1.00)
Creatinine, Ser: 1.41 mg/dL — ABNORMAL HIGH (ref 0.44–1.00)
GFR, Estimated: 37 mL/min — ABNORMAL LOW (ref >60)
GFR, Estimated: 37 mL/min — ABNORMAL LOW (ref >60)
Glucose, Bld: 244 mg/dL — ABNORMAL HIGH (ref 70–99)
Glucose, Bld: 346 mg/dL — ABNORMAL HIGH (ref 70–99)
Potassium: 5.5 mmol/L — ABNORMAL HIGH (ref 3.5–5.1)
Potassium: 4.8 mmol/L (ref 3.5–5.1)
Sodium: 135 mmol/L (ref 135–145)
Sodium: 133 mmol/L — ABNORMAL LOW (ref 135–145)

## 2022-11-27 LAB — CBC
HCT: 29.2 % — ABNORMAL LOW (ref 36.0–46.0)
HCT: 26.7 % — ABNORMAL LOW (ref 36.0–46.0)
Hemoglobin: 8.9 g/dL — ABNORMAL LOW (ref 12.0–15.0)
Hemoglobin: 8.2 g/dL — ABNORMAL LOW (ref 12.0–15.0)
MCH: 23.9 pg — ABNORMAL LOW (ref 26.0–34.0)
MCH: 24 pg — ABNORMAL LOW (ref 26.0–34.0)
MCHC: 30.5 g/dL (ref 30.0–36.0)
MCHC: 30.7 g/dL (ref 30.0–36.0)
MCV: 78.5 fL — ABNORMAL LOW (ref 80.0–100.0)
MCV: 78.1 fL — ABNORMAL LOW (ref 80.0–100.0)
Platelets: 423 10*3/uL — ABNORMAL HIGH (ref 150–400)
Platelets: 428 10*3/uL — ABNORMAL HIGH (ref 150–400)
RBC: 3.72 MIL/uL — ABNORMAL LOW (ref 3.87–5.11)
RBC: 3.42 MIL/uL — ABNORMAL LOW (ref 3.87–5.11)
RDW: 16.9 % — ABNORMAL HIGH (ref 11.5–15.5)
RDW: 17 % — ABNORMAL HIGH (ref 11.5–15.5)
WBC: 15.3 10*3/uL — ABNORMAL HIGH (ref 4.0–10.5)
WBC: 15.1 10*3/uL — ABNORMAL HIGH (ref 4.0–10.5)
nRBC: 0 % (ref 0.0–0.2)
nRBC: 0 % (ref 0.0–0.2)

## 2022-11-27 LAB — GLUCOSE, CAPILLARY
Glucose-Capillary: 185 mg/dL — ABNORMAL HIGH (ref 70–99)
Glucose-Capillary: 266 mg/dL — ABNORMAL HIGH (ref 70–99)
Glucose-Capillary: 281 mg/dL — ABNORMAL HIGH (ref 70–99)
Glucose-Capillary: 354 mg/dL — ABNORMAL HIGH (ref 70–99)

## 2022-11-27 LAB — MAGNESIUM: Magnesium: 1.7 mg/dL (ref 1.7–2.4)

## 2022-11-27 MED ORDER — JUVEN PO PACK
1.0000 | PACK | Freq: Two times a day (BID) | ORAL | Status: DC
  Administered 2022-11-29 – 2022-12-03 (×10): 1 via ORAL
  Filled 2022-11-27 (×10): qty 1

## 2022-11-27 MED ORDER — RENA-VITE PO TABS
1.0000 | ORAL_TABLET | Freq: Every day | ORAL | Status: DC
  Administered 2022-11-27 – 2022-12-03 (×7): 1 via ORAL
  Filled 2022-11-27 (×7): qty 1

## 2022-11-27 MED ORDER — ACETAMINOPHEN 500 MG PO TABS
1000.0000 mg | ORAL_TABLET | Freq: Four times a day (QID) | ORAL | Status: DC | PRN
  Administered 2022-11-27 – 2022-12-02 (×6): 1000 mg via ORAL
  Filled 2022-11-27 (×6): qty 2

## 2022-11-27 MED ORDER — CLONIDINE HCL 0.2 MG/24HR TD PTWK
0.2000 mg | MEDICATED_PATCH | TRANSDERMAL | Status: DC
  Administered 2022-11-27 – 2022-12-04 (×2): 0.2 mg via TRANSDERMAL
  Filled 2022-11-27 (×2): qty 1

## 2022-11-27 MED ORDER — OXYCODONE HCL 5 MG PO TABS
2.5000 mg | ORAL_TABLET | Freq: Every evening | ORAL | Status: DC | PRN
  Administered 2022-11-27 – 2022-11-28 (×2): 2.5 mg via ORAL
  Filled 2022-11-27 (×2): qty 1

## 2022-11-27 MED ORDER — ACETAMINOPHEN 500 MG PO TABS: 1000.0000 mg | ORAL_TABLET | Freq: Four times a day (QID) | ORAL | Status: DC

## 2022-11-27 MED ORDER — ACETAMINOPHEN 500 MG PO TABS: 1000.0000 mg | ORAL_TABLET | Freq: Four times a day (QID) | ORAL | Status: DC | PRN

## 2022-11-27 MED ORDER — TRAMADOL HCL 50 MG PO TABS
50.0000 mg | ORAL_TABLET | Freq: Two times a day (BID) | ORAL | Status: DC | PRN
  Administered 2022-11-28 – 2022-12-04 (×8): 50 mg via ORAL
  Filled 2022-11-27 (×10): qty 1

## 2022-11-27 MED ORDER — ENSURE ENLIVE PO LIQD
237.0000 mL | Freq: Three times a day (TID) | ORAL | Status: DC
  Administered 2022-11-27 – 2022-12-04 (×12): 237 mL via ORAL
  Filled 2022-11-27 (×2): qty 237

## 2022-11-27 MED ORDER — SODIUM ZIRCONIUM CYCLOSILICATE 10 G PO PACK
10.0000 g | PACK | Freq: Once | ORAL | Status: AC
  Administered 2022-11-27: 10 g via ORAL
  Filled 2022-11-27: qty 1

## 2022-11-27 NOTE — Progress Notes (Signed)
Initial Nutrition Assessment  DOCUMENTATION CODES:   Not applicable  INTERVENTION:  - Add Ensure Enlive po TID, each supplement provides 350 kcal and 20 grams of protein.  - Add Juven BID.   - Add Magic cup TID with meals, each supplement provides 290 kcal and 9 grams of protein  - Add Renal MVI q day.   NUTRITION DIAGNOSIS:   Inadequate oral intake related to inability to eat, lethargy/confusion as evidenced by  .  GOAL:   Patient will meet greater than or equal to 90% of their needs  MONITOR:   PO intake, Supplement acceptance  REASON FOR ASSESSMENT:   Consult Assessment of nutrition requirement/status  ASSESSMENT:   82 y.o. female admits related to bilateral leg pain. PMH includes: HTN, CKD, T2DM, anemia, GERD, HLD. Pt is currently receiving medical management for critical limb ischemia of both lower extremities.  Meds reviewed: folic acid, sliding scale insulin, miralax, senokot, lokelma. Labs reviewed: K high.   The pt was sleeping at time of assessment. The pt's daughter was present at bedside. She reports that her mother has not been eating well for the past couple of years. She states that her mother has been pocketing food and she had to pull food out of her mouth. The pt does not have teeth so chewing food is difficult to nearly impossible. The daughter reports that she is doing best with soft foods and liquids. Pt has experienced wt loss but it has been gradual over the past 2 years. RD discussed with DO about re-consulting SLP to evaluate for a safer diet in setting of pocketing food.   RD will add Ensure TID, Magic Cup TID and Juven BID.   NUTRITION - FOCUSED PHYSICAL EXAM:  Pt sleeping at time of assessment, attempt at f/u.   Diet Order:   Diet Order             Diet regular Room service appropriate? Yes with Assist; Fluid consistency: Thin  Diet effective now                   EDUCATION NEEDS:   Not appropriate for education at this  time  Skin:  Skin Assessment: Skin Integrity Issues: Skin Integrity Issues:: Stage II Stage II: L and R buttocks  Last BM:  11/21/22  Height:   Ht Readings from Last 1 Encounters:  11/22/22 '5\' 4"'$  (1.626 m)    Weight:   Wt Readings from Last 1 Encounters:  11/22/22 54.4 kg    Ideal Body Weight:     BMI:  Body mass index is 20.59 kg/m.  Estimated Nutritional Needs:   Kcal:  1630-1905 kcals  Protein:  80-95 gm  Fluid:  >/= 1.6 L  Thalia Bloodgood, RD, LDN, CNSC.

## 2022-11-27 NOTE — Progress Notes (Addendum)
  Progress Note    11/27/2022 7:12 AM 4 Days Post-Op  Subjective:  having a lot of pain in left leg   Vitals:   11/27/22 0000 11/27/22 0353  BP: (!) 181/87 (!) 170/88  Pulse: 100 100  Resp: 20 20  Temp: 99.6 F (37.6 C) 99.2 F (37.3 C)  SpO2: 99% 100%   Physical Exam: Cardiac:  regular Lungs:  non labored Incisions:  left groin, left leg incisions are well appearing Extremities:  Left leg without signals, cool. Dry gangrene  Neurologic: alert and oriented  CBC    Component Value Date/Time   WBC 15.3 (H) 11/27/2022 0123   RBC 3.72 (L) 11/27/2022 0123   HGB 8.9 (L) 11/27/2022 0123   HGB 9.0 (L) 05/03/2020 1005   HCT 29.2 (L) 11/27/2022 0123   HCT 28.9 (L) 05/03/2020 1005   PLT 423 (H) 11/27/2022 0123   PLT 476 (H) 05/03/2020 1005   MCV 78.5 (L) 11/27/2022 0123   MCV 78 (L) 05/03/2020 1005   MCH 23.9 (L) 11/27/2022 0123   MCHC 30.5 11/27/2022 0123   RDW 16.9 (H) 11/27/2022 0123   RDW 15.5 (H) 05/03/2020 1005   LYMPHSABS 1.4 11/20/2022 1638   LYMPHSABS 1.8 05/03/2020 1005   MONOABS 1.2 (H) 11/20/2022 1638   EOSABS 0.0 11/20/2022 1638   EOSABS 0.1 05/03/2020 1005   BASOSABS 0.0 11/20/2022 1638   BASOSABS 0.1 05/03/2020 1005    BMET    Component Value Date/Time   NA 135 11/27/2022 0123   NA 140 07/26/2020 1506   K 5.5 (H) 11/27/2022 0123   CL 105 11/27/2022 0123   CO2 21 (L) 11/27/2022 0123   GLUCOSE 244 (H) 11/27/2022 0123   BUN 15 11/27/2022 0123   BUN 37 (H) 07/26/2020 1506   CREATININE 1.42 (H) 11/27/2022 0123   CALCIUM 8.6 (L) 11/27/2022 0123   CALCIUM 10.1 06/07/2022 0938   GFRNONAA 37 (L) 11/27/2022 0123   GFRAA 32 (L) 08/02/2020 1410    INR    Component Value Date/Time   INR 1.2 11/22/2022 0628     Intake/Output Summary (Last 24 hours) at 11/27/2022 5638 Last data filed at 11/27/2022 9373 Gross per 24 hour  Intake 474.78 ml  Output 1350 ml  Net -875.22 ml     Assessment/Plan:  Wendy [name] is s/p Left femoral to peroneal artery  bypass graft with 6 mm external ring PTFE 5 Days Post-Op, Thrombectomy of left femoral to peroneal artery bypass graft  4 Days Post-Op   Continues to have LLE> RLE pain Thrombosed left common femoral to peroneal bypass No further revascularization options  H&H stable Per team discussions yesterday patient and family have decided to pursue left AKA Will discuss timing of this with Dr. Stanford Breed Dr. Marcell Anger, PA-C [facility] [phone] 11/27/2022 7:12 AM  I agree with the above.  I have seen and evaluated the patient.  I had an extensive conversation with the sister at the bedside.  We are in agreement to proceed with left above-knee amputation tomorrow.  Wendy [name]

## 2022-11-27 NOTE — NC FL2 (Signed)
[place] LEVEL OF CARE FORM     IDENTIFICATION  Patient Name: Wendy [name] Birthdate: March 20, 1941 Sex: female Admission Date (Current Location): 11/20/2022  [facility] and [place] Number:  [employer] and Address:  The . [facility], [place] [address] [place], [place] [address]      Provider Number: [id number]  Attending Physician Name and Address:  Leeanne Rio, MD  Relative Name and Phone Number:       Current Level of Care: Hospital Recommended Level of Care: [place] Prior Approval Number:    Date Approved/Denied:   PASRR Number: [id number]  Discharge Plan: SNF    Current Diagnoses: Patient Active Problem List   Diagnosis Date Noted   Fever 11/27/2022   Acute lower extremity ischemia 11/24/2022   Cellulitis of foot 11/24/2022   Acute blood loss anemia 11/24/2022   Pressure injury of skin 11/24/2022   Hyperkalemia 38/46/6599   Vascular complication 35/70/1779   Critical limb ischemia of both lower extremities ([place]) 11/21/2022   Urinary tract infection without hematuria 12/21/2021   Urinary incontinence 12/21/2021   No-show for appointment 11/28/2020   Localized edema 07/11/2020   Cerumen debris on tympanic membrane of both ears 07/11/2020   Methotrexate, long term, current use 12/05/2017   CKD stage 3 due to type 2 diabetes mellitus ([place]) 01/13/2015   Dyslipidemia 01/13/2015   Memory loss 04/09/2014   Adrenal nodule ([place]) 02/27/2014   Thyroid nodule 02/27/2014   Microcytic anemia 02/25/2014   Rheumatoid arthritis ([place]) 02/25/2014   PVD (peripheral vascular disease) ([place]) 12/02/2012   DYSPHAGIA UNSPECIFIED 08/18/2008   COLONIC POLYPS 08/17/2008   Type 2 diabetes mellitus with kidney complication, without long-term current use of insulin ([place]) 08/17/2008   Essential hypertension 08/17/2008   DIVERTICULOSIS, COLON 08/18/2002    Orientation RESPIRATION BLADDER Height & Weight     Self,  Place  Normal External catheter, Incontinent Weight: 119 lb 14.9 oz (54.4 kg) Height:  '5\' 4"'$  (162.6 cm)  BEHAVIORAL SYMPTOMS/MOOD NEUROLOGICAL BOWEL NUTRITION STATUS      Continent Diet (please see discharge summary)  AMBULATORY STATUS COMMUNICATION OF NEEDS Skin   Limited Assist Verbally Surgical wounds (closed incision left leg, closed incision left groin, wound incision non pressure wound, anterior toe,left, pressure injury right buttocks stage 2)                       Personal Care Assistance Level of Assistance  Bathing, Feeding, Dressing Bathing Assistance: Limited assistance Feeding assistance: Independent Dressing Assistance: Limited assistance     Functional Limitations Info  Sight, Hearing, Speech Sight Info: Adequate Hearing Info: Adequate Speech Info: Adequate    SPECIAL CARE FACTORS FREQUENCY  PT (By licensed PT), OT (By licensed OT)     PT Frequency: 5x per week OT Frequency: 5x per week            Contractures Contractures Info: Not present    Additional Factors Info  Code Status, Allergies Code Status Info: FULL Allergies Info: Codeine, Erythromycin,lefunomide,lisnopril,naproxen,penicillins,pioglitazone,valsartan           Current Medications (11/27/2022):  This is the current hospital active medication list Current Facility-Administered Medications  Medication Dose Route Frequency Provider Last Rate Last Admin   0.9 %  sodium chloride infusion   Intravenous Continuous Baglia, Corrina, PA-C 20 mL/hr at 11/23/22 0502 Infusion Verify at 11/23/22 0502   acetaminophen (TYLENOL) tablet 1,000 mg  1,000 mg Oral Q6H PRN Sharion Settler,  DO   1,000 mg at 11/27/22 1134   aspirin EC tablet 81 mg  81 mg Oral Daily Martyn Malay, MD   81 mg at 11/27/22 0944   bisacodyl (DULCOLAX) suppository 10 mg  10 mg Rectal Daily PRN Baglia, Corrina, PA-C       cefTRIAXone (ROCEPHIN) 2 g in sodium chloride 0.9 % 100 mL IVPB  2 g Intravenous Q24H Paige, Victoria J, DO  200 mL/hr at 11/27/22 1617 2 g at 11/27/22 1617   cloNIDine (CATAPRES - Dosed in mg/24 hr) patch 0.2 mg  0.2 mg Transdermal Weekly Sharion Settler, DO   0.2 mg at 11/27/22 1305   feeding supplement (ENSURE ENLIVE / ENSURE PLUS) liquid 237 mL  237 mL Oral TID BM Leeanne Rio, MD       folic acid (FOLVITE) tablet 1 mg  1 mg Oral Daily Baglia, Corrina, PA-C   1 mg at 11/27/22 0944   gabapentin (NEURONTIN) capsule 200 mg  200 mg Oral TID Baglia, Corrina, PA-C   200 mg at 11/27/22 1614   guaiFENesin-dextromethorphan (ROBITUSSIN DM) 100-10 MG/5ML syrup 15 mL  15 mL Oral Q4H PRN Baglia, Corrina, PA-C       HYDROmorphone (DILAUDID) injection 0.5 mg  0.5 mg Intravenous Q3H PRN Lilland, Alana, DO   0.5 mg at 11/26/22 1752   insulin aspart (novoLOG) injection 0-6 Units  0-6 Units Subcutaneous TID WC Baglia, Corrina, PA-C   3 Units at 11/27/22 1305   memantine (NAMENDA) tablet 10 mg  10 mg Oral Daily Baglia, Corrina, PA-C   10 mg at 11/27/22 0944   metoprolol succinate (TOPROL-XL) 24 hr tablet 25 mg  25 mg Oral Daily Baglia, Corrina, PA-C   25 mg at 11/27/22 0944   multivitamin (RENA-VIT) tablet 1 tablet  1 tablet Oral QHS Leeanne Rio, MD       [START ON 11/28/2022] nutrition supplement (JUVEN) (JUVEN) powder packet 1 packet  1 packet Oral BID BM Leeanne Rio, MD       ondansetron [facility]) injection 4 mg  4 mg Intravenous Q6H PRN Baglia, Corrina, PA-C       oxyCODONE (Oxy IR/ROXICODONE) immediate release tablet 2.5 mg  2.5 mg Oral QHS PRN Nita Sells, Alejandra, DO       pantoprazole (PROTONIX) EC tablet 40 mg  40 mg Oral Daily Baglia, Corrina, PA-C   40 mg at 11/27/22 0944   phenol (CHLORASEPTIC) mouth spray 1 spray  1 spray Mouth/Throat PRN Baglia, Corrina, PA-C       polyethylene glycol (MIRALAX / GLYCOLAX) packet 17 g  17 g Oral Daily Baglia, Corrina, PA-C   17 g at 11/27/22 0944   rivastigmine (EXELON) 9.5 mg/24hr 9.5 mg  9.5 mg Transdermal Daily Martyn Malay, MD   9.5 mg at  11/27/22 0946   rosuvastatin (CRESTOR) tablet 10 mg  10 mg Oral Daily Baglia, Corrina, PA-C   10 mg at 11/27/22 0943   senna-docusate (Senokot-S) tablet 1 tablet  1 tablet Oral Daily Baglia, Corrina, PA-C   1 tablet at 11/27/22 0944   traMADol (ULTRAM) tablet 50 mg  50 mg Oral Q12H PRN Sharion Settler, DO       vancomycin (VANCOCIN) IVPB 1000 mg/200 mL premix  1,000 mg Intravenous Q48H Elsie Amis, RPH 200 mL/hr at 11/25/22 1911 1,000 mg at 11/25/22 1911     Discharge Medications: Please see discharge summary for a list of discharge medications.  Relevant Imaging Results:  Relevant Lab Results:  Additional Information SSN [id number]  Vinie Sill, LCSW

## 2022-11-27 NOTE — Progress Notes (Addendum)
Pharmacy Antibiotic Note-Follow Up  Wendy [name] is a 82 y.o. female admitted on 11/20/2022 presenting with limb ischemia s/p femoral to peroneal artery bypass, toe ulceration and concern for cellulitis.  Pharmacy originally consulted for vancomycin and cefepime dosing. Cefepime has been switched to ceftriaxone. WBC trending down to 15, afebrile, Scr improved at 1.42 and continues to be stable at baseline. Blood cultures are no growth to date. Patient has opted for palliative left AKA planned for 11/28/2022. Plan is to discontinue antibiotics if remains afebrile and no worsening of clinical status.  Plan: Continue vancomycin to 1000 mg IV q 48h (eAUC 494, Scr 1.42, using TBW, Vd 0.72)  Continue ceftriaxone 2g IV every 24 hours Monitor renal function, clinical status, WBC, and fever curve   Height: '5\' 4"'$  (162.6 cm) Weight: 54.4 kg (119 lb 14.9 oz) IBW/kg (Calculated) : 54.7  Temp (24hrs), Avg:99.7 F (37.6 C), Min:98.9 F (37.2 C), Max:100.6 F (38.1 C)  Recent Labs  Lab 11/23/22 0635 11/23/22 1441 11/24/22 0545 11/24/22 1446 11/25/22 0646 11/26/22 0600 11/26/22 1458 11/27/22 0123  WBC 11.6*  --  16.4*  --  14.6* 17.7*  --  15.3*  CREATININE 1.57*   < >  --  1.54* 1.47* 1.36* 1.41* 1.42*   < > = values in this interval not displayed.     Estimated Creatinine Clearance: 25.8 mL/min (A) (by C-G formula based on SCr of 1.42 mg/dL (H)).    Allergies  Allergen Reactions   Codeine    Erythromycin    Leflunomide Other (See Comments)   Lisinopril    Naproxen    Penicillins     Filled Augmentin 09/2022   Pioglitazone    Valsartan Other (See Comments)   Cultures 1/02 Bcx: NG<12h  Antibiotics during hospital course Vanc 12/27>> Cefepime 12/27>>12/30 CTX 12/31>>  Sandford Craze, PharmD. [facility] Acute Care PGY-1  11/27/2022 2:07 PM

## 2022-11-27 NOTE — Progress Notes (Signed)
Physical Therapy Treatment Patient Details Name: Wendy [name] MRN: [id number] DOB: 06-23-1941 Today's Date: 11/27/2022   History of Present Illness Pt is an 82 y/o F admitted on 11/20/22 after presenting with c/o increased BLE pain & worsening wounds on L foot. Pt recently underwent LLE CO2 angiogram on 11/12/22 & plan was for L common femoral to peroneal artery bypass on 12/25/22. Vascular was consulted & pt found to have dry gangrene on all 5 digits of L foot with dorsal blistering & evidence of skin infection. Pt s/p L common femoral to peroneal artery bypass on 11/22/22. Plan for AKA.  PMH: DM2, essential HTN, PVD, CKD Stage 3, RA, IBS    PT Comments    Session focused on repositioning and education with patient and daughter present in room. Lengthy discussion wtih daughter in room regarding role of PT in the acute setting, plan for future therapy etc. Plan and goals will be constantly changing and adjusting and we will know more post surgery. Explained importance of pain control, strengthening esp prior to having a big surgery as an amputation, role of post acute rehab vs home, repositioning, pressure relief, preventing PNA, importance of mobility etc. Encouraged repositioning every 1-2 hours to prevent adverse affects of immobility. Will likely need to make recommendations for DME pending POC and disposition. Daughter declined any EOB or OOB activity today due to pain in BLE, LLE>RLE. Will follow up post surgery. Plan for left AKA later this week.   Recommendations for follow up therapy are one component of a multi-disciplinary discharge planning process, led by the attending physician.  Recommendations may be updated based on patient status, additional functional criteria and insurance authorization.  Follow Up Recommendations  Skilled nursing-short term rehab (<3 hours/day) (vs home pending outcome of surgery) Can patient physically be transported by private vehicle: No   Assistance  Recommended at Discharge Frequent or constant Supervision/Assistance  Patient can return home with the following A lot of help with walking and/or transfers;A lot of help with bathing/dressing/bathroom;Assist for transportation;Assistance with cooking/housework;Help with stairs or ramp for entrance   Equipment Recommendations  Other (comment) (TBA pending disposition)    Recommendations for Other Services       Precautions / Restrictions Precautions Precautions: Fall Restrictions Weight Bearing Restrictions: No     Mobility  Bed Mobility Overal bed mobility: Needs Assistance Bed Mobility: Rolling Rolling: Mod assist, +2 for physical assistance         General bed mobility comments: mod A to roll towards right for repositioning and to place pillow under hip. Daughter present and declining EOB/OOB activities due to painful LLE. Total A to scoot up in bed.    Transfers                        Ambulation/Gait                   Stairs             Wheelchair Mobility    Modified Rankin (Stroke Patients Only)       Balance                                            Cognition Arousal/Alertness: Awake/alert Behavior During Therapy: Flat affect Overall Cognitive Status: Difficult to assess  General Comments: Pt tangential and having difficulty communicating needs/concerns this session. Follows commands with increased time/cues.        Exercises      General Comments General comments (skin integrity, edema, etc.): Lengthy discussion wtih daughter in room regarding role of PT in the acute setting, plan for future therapy etc. Plan and goals will be constantly changing and adjusting and we will know more post surgery. Explained importance of pain control, strengthening esp prior to having a big surgery as an amputation, role of post acute rehab vs home, repositioning, pressure relief,  preventing PNA, importance of mobility etc. Encouraged repositioning every 1-2 hours.      Pertinent Vitals/Pain Pain Assessment Pain Assessment: Faces Faces Pain Scale: Hurts even more Pain Location: BLEs Pain Descriptors / Indicators: Discomfort, Grimacing, Guarding Pain Intervention(s): Monitored during session, Repositioned, Limited activity within patient's tolerance    Home Living                          Prior Function            PT Goals (current goals can now be found in the care plan section) Progress towards PT goals: Not progressing toward goals - comment (daughter does not want any movement EOB or OOB due to pain)    Frequency    Min 2X/week      PT Plan Current plan remains appropriate    Co-evaluation              AM-PAC PT "6 Clicks" Mobility   Outcome Measure  Help needed turning from your back to your side while in a flat bed without using bedrails?: A Lot Help needed moving from lying on your back to sitting on the side of a flat bed without using bedrails?: Total Help needed moving to and from a bed to a chair (including a wheelchair)?: Total Help needed standing up from a chair using your arms (e.g., wheelchair or bedside chair)?: Total Help needed to walk in hospital room?: Total Help needed climbing 3-5 steps with a railing? : Total 6 Click Score: 7    End of Session   Activity Tolerance: Patient limited by pain;Other (comment) (cognition) Patient left: in bed;with family/visitor present Nurse Communication: Mobility status PT Visit Diagnosis: Pain;Difficulty in walking, not elsewhere classified (R26.2);Muscle weakness (generalized) (M62.81) Pain - Right/Left: Left Pain - part of body: Leg;Ankle and joints of foot     Time: 9935-7017 PT Time Calculation (min) (ACUTE ONLY): 18 min  Charges:  $Self Care/Home Management: 8-22                     Zettie Cooley, DPT Acute Rehabilitation Services Secure chat  preferred Office [place] 11/27/2022, 12:04 PM

## 2022-11-27 NOTE — TOC Transition Note (Signed)
Transition of Care [facility]) - CM/SW Discharge Note   Patient Details  Name: Wendy [name] MRN: [id number] Date of Birth: [birth year]  [facility] CM/SW Contact:  Vinie Sill, LCSW Phone Number: 11/27/2022, 3:40 PM   Clinical Narrative:     CSW spoke with patient's daughter, Lorriane Shire. CSW introduced self and explained role. Family reports patient lives home alone but has family/children that can support her in the home. CSW informed family of recommendation for  short term rehab at [facility]. Family agrees with recommendation and gives CSW permission to send out SNF referrals. CSW explained the SNF process. No preferred SNF at this time.  TOC will  provide bed offers once available TOC will continue to follow and assist with discharge planning.   Thurmond Butts, MSW, LCSW Clinical Social Worker    Final next level of care: [place] Barriers to Discharge: Ship broker, SNF Pending bed offer   Patient Goals and CMS Choice      Discharge Placement                         Discharge Plan and Services Additional resources added to the After Visit Summary for   In-house Referral: Clinical Social Work                                   Social Determinants of Health ([place]) Interventions [place]: No Food Insecurity (11/22/2022)  Housing: Low Risk  (11/22/2022)  Transportation Needs: No Transportation Needs (11/22/2022)  Utilities: Not At Risk (11/22/2022)  Depression (PHQ2-9): Medium Risk (12/08/2020)  Financial Resource Strain: Low Risk  (10/05/2020)  Physical Activity: Inactive (10/05/2020)  Social Connections: Socially Isolated (12/08/2020)  Tobacco Use: Low Risk  (11/24/2022)     Readmission Risk Interventions     No data to display

## 2022-11-27 NOTE — Progress Notes (Addendum)
Daily Progress Note Intern Pager: [phone]  Patient name: Wendy [name] Medical record number: [id number] Date of birth: 01-Sep-1941 Age: 82 y.o. Gender: female  Primary Care Provider: Default, Provider, MD Consultants: Palliative, Vascular Surgery, Podiatry  Code Status: FULL CODE   Pt Overview and Major Events to Date:  12/27: Admitted, vascular consulted 12/28: L leg femoral to peroneal artery bypass, podiatry consulted 1/1: Per conversation with Palliative, family decided to proceed with left AKA   Assessment and Plan: Wendy [name] is a 82 y.o. female who was admitted with bilateral leg pain found to have worsening chronic critical limb ischemia, now s/p femoral to peroneal artery bypass with plans for palliative LLE amputation.   * Critical limb ischemia of both lower extremities (HCC) There was concern for concomitant cellulitis, currently on antibiotics. Goals of care meeting yesterday and decision was made for palliative left AKA.  - Vascular following, appreciate recs - Podiatry following, appreciate recs - Palliative care following, appreciate recs - Continue Vanc and ceftriaxone- consider d/c 1/3 if remains afebrile and no worsening clinical status - Miralax and Senna - Daily ASA - Pain Management:   Tylenol 1000 mg q6h PRN pain, fever Gabapentin '200mg'$  TID  Tramadol '50mg'$  q12h (adjusted per renal function) PRN moderate pain  Oxycodone 2.'5mg'$  QHS (per family wishes) PRN for severe pain  Dilaudid 0.'5mg'$  q3h PRN for severe breakthrough pain  Fever Had a low-grade fever to 100.6 at 2000 yesterday. Bcx collected, Ucx ordered but not collected. Has been afebrile since. WBC trended down from 17.7-15.3. Saturating 99-100% on RA. Upon examination today it was noted that she was also under several blankets, could also be contributing.  -Monitor for signs/symptoms of worsening or new infection -Continue CTX, vanc  -Shared decision making with family to defer Ucx for now given  no re-fever and on current IV CTX. They are amenable to catheterized urine sample if re-fevers -Encouraged to try to limit number of blankets as this could be contributing -F/u Bcx collected 1/1 -Tylenol PRN   Acute blood loss anemia Hgb downtrended from 10.1 to 8.9. Only on 81 mg ASA for VTE ppx. Completed last transfusion on 12/31. - PM CBC, if stable consider restarting subq heparin for VTE ppx  Hyperkalemia Hyperkalemic to 5.5 this morning. - 10 mg Lokelma ordered - PM BMP  Essential hypertension Home medications include clonidine patch 0.3 mg weekly, irbesartan 75 mg, hydrochlorothiazide 12.5 mg, metoprolol succinate 25 mg. BP ranging 147-829 systolic over 56-213 diastolic in the last 24 hours. Denies HA, chest pain, SOB. No LE edema.  - Continue home Metoprolol '25mg'$  daily  - Holding Irbesartan due to hyperkalemia. Holding HCTZ given poor PO intake and renal function - Consider restarting home clonidine vs adding CCB (no evidence of systolic HF on last echo in 2021. Per chart review has allergy to amlodipine but no reaction listed and not on current allergy list)  Type 2 diabetes mellitus with kidney complication, without long-term current use of insulin (HCC) Received 4 Units of Novolog in the last 24h. - CBGs QAC and QHS - sensitive SSI   FEN/GI: Regular given limited PO intake  PPx: ASA per vascular  Dispo:SNF pending clinical improvement . Barriers include left AKA.   Subjective:  Patient is accompanied by three family members today. She continues to express some leg discomfort. They confirm wishes to proceed with LE AKA for palliation. They are concerned about her decreased appetite and PO intake and wondering the next steps if  her appetite continues to be diminished. She was seen eating some breakfast this morning, being fed by her son.   Objective: Temp:  [98.6 F (37 C)-100.6 F (38.1 C)] 98.6 F (37 C) (01/02 1213) Pulse Rate:  [92-100] 96 (01/02 1213) Resp:   [19-20] 19 (01/02 1213) BP: (165-190)/(87-124) 173/94 (01/02 1213) SpO2:  [99 %-100 %] 99 % (01/02 1213) Physical Exam: General: Elderly, chronically ill appearing, pleasant, cooperative, no acute distress Cardiovascular: RRR  Respiratory: Normal effort on RA, clear anterior and posterior fields without wheezing/rhonchi/rales  Abdomen: soft, non-tender in all quadrants, no R/G Extremities: chronic skin changes to b/l lower extremities, dry gangrene appreciated to left foot   Laboratory: Most recent CBC Lab Results  Component Value Date   WBC 15.3 (H) 11/27/2022   HGB 8.9 (L) 11/27/2022   HCT 29.2 (L) 11/27/2022   MCV 78.5 (L) 11/27/2022   PLT 423 (H) 11/27/2022   Most recent BMP    Latest Ref Rng & Units 11/27/2022    1:23 AM  BMP  Glucose 70 - 99 mg/dL 244   BUN 8 - 23 mg/dL 15   Creatinine 0.44 - 1.00 mg/dL 1.42   Sodium 135 - 145 mmol/L 135   Potassium 3.5 - 5.1 mmol/L 5.5   Chloride 98 - 111 mmol/L 105   CO2 22 - 32 mmol/L 21   Calcium 8.9 - 10.3 mg/dL 8.6    Imaging/Diagnostic Tests: No new labs.   Sharion Settler, DO 11/27/2022, 12:18 PM  PGY-3, [place] Intern pager: [phone], text pages welcome Secure chat group [place]

## 2022-11-27 NOTE — Plan of Care (Signed)
  Problem: Activity: Goal: Ability to return to baseline activity level will improve Outcome: Not Progressing   Problem: Skin Integrity: Goal: Risk for impaired skin integrity will decrease Outcome: Not Progressing   Problem: Health Behavior/Discharge Planning: Goal: Ability to manage health-related needs will improve Outcome: Not Progressing   Problem: Nutrition: Goal: Adequate nutrition will be maintained Outcome: Not Progressing

## 2022-11-27 NOTE — Progress Notes (Signed)
Palliative Medicine Progress Note   Patient Name: Wendy [name]       Date: 11/27/2022 DOB: [birth year]  Age: 82 y.o. MRN#: [id number] Attending Physician: Leeanne Rio, MD Primary Care Physician: Default, Provider, MD Admit Date: 11/20/2022    HPI/Patient Profile: Wendy [name] is a 82 y.o. female that presented with bilateral leg pain found to have worsening chronic critical limb ischemia s/p stent with vascular surgery. PMH includes HTN, CKD, T2DM, Sjogren syndrome, anemia, GERD, HLD      Palliative care has been asked to get involved to support further goals of care conversations in the setting of decisions related to L AKA.    Subjective: Chart reviewed and patient assessed at bedside. PT session this morning was limited due to BLE pain (Left>right).   Discussion was had with daughter/[name] regarding potential trajectory after major amputation. Discussed issues including post-op pain management, functional status/mobility, and potential for depression. Discussed disposition options including acute rehab versus home hospice. Discussed that the goal of rehab is to improve functional status, which can sometimes be challenging for patients with cognitive impairment.   Discussed the importance of continued conversation with family and the medical team regarding overall plan of care. Offered ongoing palliative support.    Objective:  Physical Exam Vitals reviewed.  Constitutional:      General: She is awake. She is not in acute distress.    Appearance: She is ill-appearing.  Pulmonary:     Effort: Pulmonary effort is normal.  Psychiatric:        Cognition and Memory: Cognition is impaired.             Palliative Medicine Assessment & Plan   Assessment: Principal  Problem:   Critical limb ischemia of both lower extremities (HCC) Active Problems:   Type 2 diabetes mellitus with kidney complication, without long-term current use of insulin (HCC)   Essential hypertension   Vascular complication   Hyperkalemia   Acute lower extremity ischemia   Cellulitis of foot   Acute blood loss anemia   Pressure injury of skin   Fever    Recommendations/Plan: Full code and full scope for now with ongoing discussion Plan for left AKA Discussed disposition options - rehab versus home with hospice Ongoing palliative support   Prognosis:  < 6 months would not be  surprising  Discharge Planning: To Be Determined    Thank you for allowing the Palliative Medicine Team to assist in the care of this patient.   MDM - [place]   Lavena Bullion, NP   Please contact Palliative Medicine Team phone at [phone] for questions and concerns.  For individual providers, please see AMION.

## 2022-11-27 NOTE — Assessment & Plan Note (Deleted)
Afebrile. Bcx NGTD. Urine culture no growth. Likely due to cellulitis.   -Monitor for signs/symptoms of worsening or new infection -Stop Continue CTX, vanc  -Encouraged to try to limit number of blankets as this could be contributing -Tylenol PRN

## 2022-11-28 ENCOUNTER — Other Ambulatory Visit: Payer: Self-pay

## 2022-11-28 ENCOUNTER — Inpatient Hospital Stay (HOSPITAL_COMMUNITY): Payer: Medicare PPO | Admitting: Anesthesiology

## 2022-11-28 ENCOUNTER — Encounter (HOSPITAL_COMMUNITY): Payer: Self-pay | Admitting: Student

## 2022-11-28 ENCOUNTER — Encounter (HOSPITAL_COMMUNITY)
Admission: EM | Disposition: A | Discharge status: Skilled Nursing Facility | Payer: Self-pay | Source: Home / Self Care | Attending: Family Medicine

## 2022-11-28 DIAGNOSIS — E1152 Type 2 diabetes mellitus with diabetic peripheral angiopathy with gangrene: Secondary | ICD-10-CM | POA: Diagnosis not present

## 2022-11-28 DIAGNOSIS — E1165 Type 2 diabetes mellitus with hyperglycemia: Secondary | ICD-10-CM | POA: Diagnosis not present

## 2022-11-28 DIAGNOSIS — I10 Essential (primary) hypertension: Secondary | ICD-10-CM

## 2022-11-28 DIAGNOSIS — I999 Unspecified disorder of circulatory system: Secondary | ICD-10-CM | POA: Diagnosis not present

## 2022-11-28 DIAGNOSIS — Z89612 Acquired absence of left leg above knee: Secondary | ICD-10-CM

## 2022-11-28 DIAGNOSIS — N181 Chronic kidney disease, stage 1: Secondary | ICD-10-CM | POA: Diagnosis not present

## 2022-11-28 DIAGNOSIS — I70223 Atherosclerosis of native arteries of extremities with rest pain, bilateral legs: Secondary | ICD-10-CM | POA: Diagnosis not present

## 2022-11-28 DIAGNOSIS — I70262 Atherosclerosis of native arteries of extremities with gangrene, left leg: Secondary | ICD-10-CM | POA: Diagnosis not present

## 2022-11-28 DIAGNOSIS — D649 Anemia, unspecified: Secondary | ICD-10-CM | POA: Diagnosis not present

## 2022-11-28 DIAGNOSIS — E1122 Type 2 diabetes mellitus with diabetic chronic kidney disease: Secondary | ICD-10-CM | POA: Diagnosis not present

## 2022-11-28 HISTORY — PX: AMPUTATION: SHX166

## 2022-11-28 LAB — BASIC METABOLIC PANEL
Anion gap: 12 (ref 5–15)
BUN: 15 mg/dL (ref 8–23)
CO2: 18 mmol/L — ABNORMAL LOW (ref 22–32)
Calcium: 8.8 mg/dL — ABNORMAL LOW (ref 8.9–10.3)
Chloride: 99 mmol/L (ref 98–111)
Creatinine, Ser: 1.38 mg/dL — ABNORMAL HIGH (ref 0.44–1.00)
GFR, Estimated: 38 mL/min — ABNORMAL LOW (ref >60)
Glucose, Bld: 314 mg/dL — ABNORMAL HIGH (ref 70–99)
Potassium: 4.9 mmol/L (ref 3.5–5.1)
Sodium: 129 mmol/L — ABNORMAL LOW (ref 135–145)

## 2022-11-28 LAB — CBC
HCT: 33.5 % — ABNORMAL LOW (ref 36.0–46.0)
Hemoglobin: 10.1 g/dL — ABNORMAL LOW (ref 12.0–15.0)
MCH: 24.4 pg — ABNORMAL LOW (ref 26.0–34.0)
MCHC: 30.1 g/dL (ref 30.0–36.0)
MCV: 80.9 fL (ref 80.0–100.0)
Platelets: 435 10*3/uL — ABNORMAL HIGH (ref 150–400)
RBC: 4.14 MIL/uL (ref 3.87–5.11)
RDW: 17.2 % — ABNORMAL HIGH (ref 11.5–15.5)
WBC: 16.7 10*3/uL — ABNORMAL HIGH (ref 4.0–10.5)
nRBC: 0 % (ref 0.0–0.2)

## 2022-11-28 LAB — GLUCOSE, CAPILLARY
Glucose-Capillary: 263 mg/dL — ABNORMAL HIGH (ref 70–99)
Glucose-Capillary: 182 mg/dL — ABNORMAL HIGH (ref 70–99)
Glucose-Capillary: 163 mg/dL — ABNORMAL HIGH (ref 70–99)
Glucose-Capillary: 172 mg/dL — ABNORMAL HIGH (ref 70–99)
Glucose-Capillary: 375 mg/dL — ABNORMAL HIGH (ref 70–99)

## 2022-11-28 SURGERY — AMPUTATION, ABOVE KNEE
Anesthesia: General | Site: Knee | Laterality: Left

## 2022-11-28 MED ORDER — CEFAZOLIN SODIUM 1 G IJ SOLR
INTRAMUSCULAR | Status: AC
  Filled 2022-11-28: qty 20

## 2022-11-28 MED ORDER — LIDOCAINE 2% (20 MG/ML) 5 ML SYRINGE
INTRAMUSCULAR | Status: AC
  Filled 2022-11-28: qty 5

## 2022-11-28 MED ORDER — PHENYLEPHRINE HCL-NACL 20-0.9 MG/250ML-% IV SOLN
INTRAVENOUS | Status: DC | PRN
  Administered 2022-11-28: 25 ug/min via INTRAVENOUS

## 2022-11-28 MED ORDER — 0.9 % SODIUM CHLORIDE (POUR BTL) OPTIME
TOPICAL | Status: DC | PRN
  Administered 2022-11-28: 1000 mL

## 2022-11-28 MED ORDER — DEXAMETHASONE SODIUM PHOSPHATE 10 MG/ML IJ SOLN
INTRAMUSCULAR | Status: AC
  Filled 2022-11-28: qty 1

## 2022-11-28 MED ORDER — OXYCODONE HCL 5 MG/5ML PO SOLN: 5.0000 mg | Freq: Once | ORAL | Status: DC | PRN

## 2022-11-28 MED ORDER — LIDOCAINE 2% (20 MG/ML) 5 ML SYRINGE
INTRAMUSCULAR | Status: DC | PRN
  Administered 2022-11-28: 40 mg via INTRAVENOUS

## 2022-11-28 MED ORDER — FENTANYL CITRATE (PF) 100 MCG/2ML IJ SOLN
INTRAMUSCULAR | Status: AC
  Administered 2022-11-28: 50 ug via INTRAVENOUS
  Filled 2022-11-28: qty 2

## 2022-11-28 MED ORDER — SUCCINYLCHOLINE CHLORIDE 200 MG/10ML IV SOSY
PREFILLED_SYRINGE | INTRAVENOUS | Status: AC
  Filled 2022-11-28: qty 10

## 2022-11-28 MED ORDER — DEXAMETHASONE SODIUM PHOSPHATE 10 MG/ML IJ SOLN
INTRAMUSCULAR | Status: DC | PRN
  Administered 2022-11-28: 4 mg via INTRAVENOUS

## 2022-11-28 MED ORDER — SODIUM CHLORIDE (PF) 0.9 % IJ SOLN
INTRAMUSCULAR | Status: AC
  Filled 2022-11-28: qty 10

## 2022-11-28 MED ORDER — LACTATED RINGERS IV SOLN: INTRAVENOUS | Status: DC

## 2022-11-28 MED ORDER — PHENYLEPHRINE 80 MCG/ML (10ML) SYRINGE FOR IV PUSH (FOR BLOOD PRESSURE SUPPORT)
PREFILLED_SYRINGE | INTRAVENOUS | Status: DC | PRN
  Administered 2022-11-28 (×3): 80 ug via INTRAVENOUS

## 2022-11-28 MED ORDER — AMLODIPINE BESYLATE 5 MG PO TABS
5.0000 mg | ORAL_TABLET | Freq: Every day | ORAL | Status: DC
  Administered 2022-11-28 – 2022-12-04 (×7): 5 mg via ORAL
  Filled 2022-11-28 (×8): qty 1

## 2022-11-28 MED ORDER — OXYCODONE HCL 5 MG PO TABS: 5.0000 mg | ORAL_TABLET | Freq: Once | ORAL | Status: DC | PRN

## 2022-11-28 MED ORDER — SUCCINYLCHOLINE CHLORIDE 200 MG/10ML IV SOSY
PREFILLED_SYRINGE | INTRAVENOUS | Status: DC | PRN
  Administered 2022-11-28: 80 mg via INTRAVENOUS

## 2022-11-28 MED ORDER — ONDANSETRON HCL 4 MG/2ML IJ SOLN
INTRAMUSCULAR | Status: AC
  Filled 2022-11-28: qty 4

## 2022-11-28 MED ORDER — FENTANYL CITRATE (PF) 250 MCG/5ML IJ SOLN
INTRAMUSCULAR | Status: DC | PRN
  Administered 2022-11-28: 50 ug via INTRAVENOUS

## 2022-11-28 MED ORDER — PHENYLEPHRINE 80 MCG/ML (10ML) SYRINGE FOR IV PUSH (FOR BLOOD PRESSURE SUPPORT)
PREFILLED_SYRINGE | INTRAVENOUS | Status: AC
  Filled 2022-11-28: qty 10

## 2022-11-28 MED ORDER — CHLORHEXIDINE GLUCONATE 0.12 % MT SOLN
OROMUCOSAL | Status: AC
  Administered 2022-11-28: 15 mL via OROMUCOSAL
  Filled 2022-11-28: qty 15

## 2022-11-28 MED ORDER — ROPIVACAINE HCL 5 MG/ML IJ SOLN
INTRAMUSCULAR | Status: DC | PRN
  Administered 2022-11-28: 15 mL via PERINEURAL

## 2022-11-28 MED ORDER — ONDANSETRON HCL 4 MG/2ML IJ SOLN
INTRAMUSCULAR | Status: DC | PRN
  Administered 2022-11-28: 4 mg via INTRAVENOUS

## 2022-11-28 MED ORDER — FENTANYL CITRATE (PF) 100 MCG/2ML IJ SOLN: 50.0000 ug | Freq: Once | INTRAMUSCULAR | Status: AC

## 2022-11-28 MED ORDER — FENTANYL CITRATE (PF) 100 MCG/2ML IJ SOLN: 25.0000 ug | INTRAMUSCULAR | Status: DC | PRN

## 2022-11-28 MED ORDER — PROPOFOL 1000 MG/100ML IV EMUL
INTRAVENOUS | Status: AC
  Filled 2022-11-28: qty 100

## 2022-11-28 MED ORDER — CHLORHEXIDINE GLUCONATE 0.12 % MT SOLN: 15.0000 mL | Freq: Once | OROMUCOSAL | Status: AC

## 2022-11-28 MED ORDER — ONDANSETRON HCL 4 MG/2ML IJ SOLN: 4.0000 mg | Freq: Once | INTRAMUSCULAR | Status: DC | PRN

## 2022-11-28 MED ORDER — PROPOFOL 10 MG/ML IV BOLUS
INTRAVENOUS | Status: DC | PRN
  Administered 2022-11-28: 60 mg via INTRAVENOUS

## 2022-11-28 MED ORDER — ORAL CARE MOUTH RINSE: 15.0000 mL | Freq: Once | OROMUCOSAL | Status: AC

## 2022-11-28 MED ORDER — HYDROCHLOROTHIAZIDE 12.5 MG PO TABS
12.5000 mg | ORAL_TABLET | Freq: Every day | ORAL | Status: DC
  Filled 2022-11-28: qty 1

## 2022-11-28 MED ORDER — FENTANYL CITRATE (PF) 250 MCG/5ML IJ SOLN
INTRAMUSCULAR | Status: AC
  Filled 2022-11-28: qty 5

## 2022-11-28 MED ORDER — HEPARIN SODIUM (PORCINE) 1000 UNIT/ML IJ SOLN
INTRAMUSCULAR | Status: AC
  Filled 2022-11-28: qty 10

## 2022-11-28 MED ORDER — LIDOCAINE-EPINEPHRINE (PF) 1.5 %-1:200000 IJ SOLN
INTRAMUSCULAR | Status: DC | PRN
  Administered 2022-11-28: 15 mL via PERINEURAL

## 2022-11-28 SURGICAL SUPPLY — 49 items
BAG COUNTER SPONGE SURGICOUNT (BAG) ×1 IMPLANT
BLADE SAW SGTL 73X25 THK (BLADE) ×1 IMPLANT
BNDG COHESIVE 6X5 TAN ST LF (GAUZE/BANDAGES/DRESSINGS) IMPLANT
BNDG COHESIVE 6X5 TAN STRL LF (GAUZE/BANDAGES/DRESSINGS) ×1 IMPLANT
BNDG ELASTIC 4X5.8 VLCR STR LF (GAUZE/BANDAGES/DRESSINGS) ×1 IMPLANT
BNDG ELASTIC 6X5.8 VLCR STR LF (GAUZE/BANDAGES/DRESSINGS) ×1 IMPLANT
BNDG GAUZE DERMACEA FLUFF 4 (GAUZE/BANDAGES/DRESSINGS) ×2 IMPLANT
CANISTER SUCT 3000ML PPV (MISCELLANEOUS) ×1 IMPLANT
CLIP VESOCCLUDE MED 6/CT (CLIP) ×1 IMPLANT
COVER BACK TABLE 60X90IN (DRAPES) ×1 IMPLANT
COVER SURGICAL LIGHT HANDLE (MISCELLANEOUS) ×2 IMPLANT
DRAIN CHANNEL 19F RND (DRAIN) IMPLANT
DRAPE DERMATAC (DRAPES) IMPLANT
DRAPE HALF SHEET 40X57 (DRAPES) ×1 IMPLANT
DRAPE INCISE IOBAN 66X45 STRL (DRAPES) IMPLANT
DRAPE ORTHO SPLIT 77X108 STRL (DRAPES) ×2
DRAPE SURG ORHT 6 SPLT 77X108 (DRAPES) ×2 IMPLANT
DRAPE U-SHAPE 47X51 STRL (DRAPES) IMPLANT
DRESSING PREVENA PLUS CUSTOM (GAUZE/BANDAGES/DRESSINGS) IMPLANT
DRSG ADAPTIC 3X8 NADH LF (GAUZE/BANDAGES/DRESSINGS) ×1 IMPLANT
DRSG PREVENA PLUS CUSTOM (GAUZE/BANDAGES/DRESSINGS)
ELECT CAUTERY BLADE 6.4 (BLADE) ×1 IMPLANT
ELECT REM PT RETURN 9FT ADLT (ELECTROSURGICAL) ×1
ELECTRODE REM PT RTRN 9FT ADLT (ELECTROSURGICAL) ×1 IMPLANT
EVACUATOR SILICONE 100CC (DRAIN) IMPLANT
GAUZE SPONGE 4X4 12PLY STRL (GAUZE/BANDAGES/DRESSINGS) ×2 IMPLANT
GLOVE BIO SURGEON STRL SZ7.5 (GLOVE) ×1 IMPLANT
GLOVE BIOGEL PI IND STRL 8 (GLOVE) ×1 IMPLANT
GOWN STRL REUS W/ TWL XL LVL3 (GOWN DISPOSABLE) ×1 IMPLANT
GOWN STRL REUS W/TWL XL LVL3 (GOWN DISPOSABLE) ×1
KIT BASIN OR (CUSTOM PROCEDURE TRAY) ×1 IMPLANT
KIT TURNOVER KIT B (KITS) ×1 IMPLANT
NS IRRIG 1000ML POUR BTL (IV SOLUTION) ×1 IMPLANT
PACK GENERAL/GYN (CUSTOM PROCEDURE TRAY) ×1 IMPLANT
PAD ARMBOARD 7.5X6 YLW CONV (MISCELLANEOUS) ×2 IMPLANT
PREVENA RESTOR ARTHOFORM 46X30 (CANNISTER) IMPLANT
STAPLER VISISTAT 35W (STAPLE) ×1 IMPLANT
STOCKINETTE IMPERVIOUS LG (DRAPES) ×1 IMPLANT
SUT BONE WAX W31G (SUTURE) ×1 IMPLANT
SUT ETHILON 3 0 PS 1 (SUTURE) IMPLANT
SUT SILK 0 TIES 10X30 (SUTURE) ×1 IMPLANT
SUT SILK 2 0 (SUTURE) ×1
SUT SILK 2 0 SH CR/8 (SUTURE) ×1 IMPLANT
SUT SILK 2-0 18XBRD TIE 12 (SUTURE) ×1 IMPLANT
SUT VIC AB 2-0 CT1 18 (SUTURE) ×2 IMPLANT
SUT VIC AB 3-0 SH 18 (SUTURE) IMPLANT
TOWEL GREEN STERILE (TOWEL DISPOSABLE) ×2 IMPLANT
UNDERPAD 30X36 HEAVY ABSORB (UNDERPADS AND DIAPERS) ×1 IMPLANT
WATER STERILE IRR 1000ML POUR (IV SOLUTION) ×1 IMPLANT

## 2022-11-28 NOTE — Anesthesia Preprocedure Evaluation (Signed)
Anesthesia Evaluation  Patient identified by MRN, date of birth, ID band Patient awake    Reviewed: Allergy & Precautions, H&P , NPO status , Patient's Chart, lab work & pertinent test results  Airway Mallampati: II  TM Distance: >3 FB Neck ROM: Limited    Dental no notable dental hx.    Pulmonary neg pulmonary ROS   breath sounds clear to auscultation + decreased breath sounds      Cardiovascular hypertension, Pt. on medications + Peripheral Vascular Disease  Normal cardiovascular exam Rhythm:Regular Rate:Normal     Neuro/Psych       Dementia negative neurological ROS  negative psych ROS   GI/Hepatic negative GI ROS, Neg liver ROS,,,  Endo/Other  diabetes, Poorly Controlled, Type 2    Renal/GU Renal InsufficiencyRenal disease  negative genitourinary   Musculoskeletal negative musculoskeletal ROS (+)    Abdominal   Peds negative pediatric ROS (+)  Hematology  (+) Blood dyscrasia, anemia   Anesthesia Other Findings   Reproductive/Obstetrics negative OB ROS                             Anesthesia Physical Anesthesia Plan  ASA: 4  Anesthesia Plan: General   Post-op Pain Management: Regional block*   Induction: Intravenous  PONV Risk Score and Plan: 3 and Ondansetron, Dexamethasone and Treatment may vary due to age or medical condition  Airway Management Planned: Oral ETT  Additional Equipment:   Intra-op Plan:   Post-operative Plan: Extubation in OR  Informed Consent: I have reviewed the patients History and Physical, chart, labs and discussed the procedure including the risks, benefits and alternatives for the proposed anesthesia with the patient or authorized representative who has indicated his/her understanding and acceptance.     Dental advisory given  Plan Discussed with: CRNA and Surgeon  Anesthesia Plan Comments:        Anesthesia Quick Evaluation

## 2022-11-28 NOTE — Progress Notes (Signed)
Pt son refused assessment of lower extremities. Pt's son educated.

## 2022-11-28 NOTE — Progress Notes (Signed)
[facility]  Subjective  - no complaints.   Objective (!) 173/90 98 98 F (36.7 C) (Oral) 20 97%  Intake/Output Summary (Last 24 hours) at 11/28/2022 1221 Last data filed at 11/28/2022 1151 Gross per 24 hour  Intake 250 ml  Output 2200 ml  Net -1950 ml    Cool left foot with dry gangrene and no signals  Laboratory Lab Results: Recent Labs    11/27/22 1441 11/28/22 0112  WBC 15.1* 16.7*  HGB 8.2* 10.1*  HCT 26.7* 33.5*  PLT 428* 435*   BMET Recent Labs    11/27/22 1441 11/28/22 0112  NA 133* 129*  K 4.8 4.9  CL 104 99  CO2 20* 18*  GLUCOSE 346* 314*  BUN 16 15  CREATININE 1.41* 1.38*  CALCIUM 8.6* 8.8*    COAG Lab Results  Component Value Date   INR 1.2 11/22/2022   No results found for: "PTT"  Assessment/Planning:  Discussed plan for left above-knee amputation.  Discussed with family at bedside.  All are willing to proceed.  All questions answered.  Marty Heck 11/28/2022 12:21 PM --

## 2022-11-28 NOTE — Progress Notes (Signed)
Daily Progress Note Intern Pager: [phone]  Patient name: Wendy [name] Medical record number: [id number] Date of birth: 1941/06/22 Age: 82 y.o. Gender: female  Primary Care Provider: Default, Provider, MD Consultants: Palliative, Vascular Surgery, Podiatry  Code Status: Full   Pt Overview and Major Events to Date:  12/27: Admitted, vascular consulted 12/28: L leg femoral to peroneal artery bypass, podiatry consulted 1/1: Per conversation with Palliative, family decided to proceed with left AKA   Assessment and Plan: Wendy [name] is a 82 y.o. female who was admitted with bilateral leg pain found to have worsening chronic critical limb ischemia, now s/p femoral to peroneal artery bypass with plans for palliative LLE amputation.   * Critical limb ischemia of both lower extremities (HCC)  Goals of care meeting yesterday and decision was made for palliative left AKA. Also being treated for potential concomitant cellulitis, currently on antibiotics. - Vascular following, appreciate recs - Podiatry following, appreciate recs - Palliative care following, appreciate recs - Continue Vanc and ceftriaxone - Miralax and Senna - Daily ASA - Pain Management:   Tylenol 1000 mg q6h PRN pain, fever Gabapentin '200mg'$  TID  Tramadol '50mg'$  q12h (adjusted per renal function) PRN moderate pain  Oxycodone 2.'5mg'$  QHS (per family wishes) PRN for severe pain  Dilaudid 0.'5mg'$  q3h PRN for severe breakthrough pain  Fever Had a low-grade fever to 100.7 yesterday evening as well as the night prior. Ucx ordered. Has been afebrile since. WBC still elevated at 16.7. Saturating 99-100% on RA. Upon examination today it was noted that she was also under several blankets, could also be contributing.  -Monitor for signs/symptoms of worsening or new infection -Continue CTX, vanc  -Encouraged to try to limit number of blankets as this could be contributing -F/u Bcx and Ucx  -Tylenol PRN   Acute blood loss  anemia Stable. Hgb 10.1 this am. Only on 81 mg ASA for VTE ppx. Completed last transfusion on 12/31. - PM CBC, if stable consider restarting subq heparin for VTE ppx  Hyperkalemia Stable. K+ 4.9. - AM BMP  Essential hypertension Home medications include clonidine patch 0.3 mg weekly, irbesartan 75 mg, hydrochlorothiazide 12.5 mg, metoprolol succinate 25 mg. BP ranging 308-657 systolic over 84-696 diastolic in the last 24 hours. Denies HA, chest pain, SOB. No LE edema.  - Continue home Metoprolol '25mg'$  daily  - Holding Irbesartan due to hyperkalemia. Holding HCTZ given poor PO intake and renal function - Started Amlodipine '5mg'$  daily   Type 2 diabetes mellitus with kidney complication, without long-term current use of insulin (HCC) Received 6 Units of Novolog in the last 24h. - CBGs QAC and QHS - sensitive SSI   Goals of care Palliative following. Full code and full scope. Plan for palliative L AKA. Dispo pending: rehab vs home with hospice   FEN/GI: NPO for amputation  PPx: ASA  Dispo:Likely SNF after L AKA. Pending clinical improvement   Subjective:  No acute events overnight.   Objective: Temp:  [97.8 F (36.6 C)-100.7 F (38.2 C)] 98.3 F (36.8 C) (01/03 0723) Pulse Rate:  [86-105] 96 (01/03 0955) Resp:  [18-20] 19 (01/03 0955) BP: (134-195)/(70-117) 183/95 (01/03 0955) SpO2:  [99 %-100 %] 100 % (01/03 0955) Physical Exam: General: Chronically ill appearing, sleeping, NAD  Cardiovascular: RRR  Respiratory: CTAB normal WOB on RA  Abdomen: soft, non distended Extremities: dry gangrene of L foot, cool extremities with chronic skin changes bilaterally   Laboratory: Most recent CBC Lab Results  Component Value  Date   WBC 16.7 (H) 11/28/2022   HGB 10.1 (L) 11/28/2022   HCT 33.5 (L) 11/28/2022   MCV 80.9 11/28/2022   PLT 435 (H) 11/28/2022   Most recent BMP    Latest Ref Rng & Units 11/28/2022    1:12 AM  BMP  Glucose 70 - 99 mg/dL 314   BUN 8 - 23 mg/dL 15    Creatinine 0.44 - 1.00 mg/dL 1.38   Sodium 135 - 145 mmol/L 129   Potassium 3.5 - 5.1 mmol/L 4.9   Chloride 98 - 111 mmol/L 99   CO2 22 - 32 mmol/L 18   Calcium 8.9 - 10.3 mg/dL 8.8     Imaging/Diagnostic Tests:  CXR:  Left basilar atelectasis and effusion.   Shary Key, DO 11/28/2022, 11:25 AM  PGY-3, [place] Intern pager: [phone], text pages welcome Secure chat group [place]

## 2022-11-28 NOTE — Progress Notes (Signed)
   11/28/22 0032  Assess: MEWS Score  BP (!) 195/93  MAP (mmHg) 122  Pulse Rate (!) 105  ECG Heart Rate (!) 105  Resp 18  SpO2 100 %  O2 Device Room Air  Assess: MEWS Score  MEWS Temp 1  MEWS Systolic 0  MEWS Pulse 1  MEWS RR 0  MEWS LOC 0  MEWS Score 2  MEWS Score Color Yellow  Assess: if the MEWS score is Yellow or Red  Were vital signs taken at a resting state? Yes  Focused Assessment No change from prior assessment  Does the patient meet 2 or more of the SIRS criteria? Yes  Does the patient have a confirmed or suspected source of infection? No  MEWS guidelines implemented *See Row Information* Yes  Treat  MEWS Interventions Administered prn meds/treatments  Pain Scale 0-10  Pain Score Asleep  Take Vital Signs  Increase Vital Sign Frequency  Yellow: Q 2hr X 2 then Q 4hr X 2, if remains yellow, continue Q 4hrs  Escalate  MEWS: Escalate Yellow: discuss with charge nurse/RN and consider discussing with provider and RRT  Notify: Charge Nurse/RN  Name of Charge Nurse/RN Notified [place]  Date Charge Nurse/RN Notified 11/28/22  Time Charge Nurse/RN Notified 0030  Provider Notification  Provider Name/Title Dorene Grebe. Sanford,MD  Date Provider Notified 11/28/22  Time Provider Notified 0034  Method of Notification Page  Notification Reason Critical Result  Provider response See new orders  Date of Provider Response 11/28/22  Time of Provider Response 0041  Document  Patient Outcome Other (Comment)  Progress note created (see row info) Yes  Assess: SIRS CRITERIA  SIRS Temperature  0  SIRS Pulse 1  SIRS Respirations  0  SIRS WBC 1  SIRS Score Sum  2

## 2022-11-28 NOTE — Plan of Care (Signed)
  Problem: Cardiovascular: Goal: Ability to achieve and maintain adequate cardiovascular perfusion will improve Outcome: Progressing   Problem: Coping: Goal: Ability to adjust to condition or change in health will improve Outcome: Progressing   Problem: Health Behavior/Discharge Planning: Goal: Ability to manage health-related needs will improve Outcome: Progressing   Problem: Nutritional: Goal: Maintenance of adequate nutrition will improve Outcome: Progressing   Problem: Skin Integrity: Goal: Risk for impaired skin integrity will decrease Outcome: Progressing   Problem: Tissue Perfusion: Goal: Adequacy of tissue perfusion will improve Outcome: Progressing   Problem: Health Behavior/Discharge Planning: Goal: Ability to manage health-related needs will improve Outcome: Progressing   Problem: Clinical Measurements: Goal: Ability to maintain clinical measurements within normal limits will improve Outcome: Progressing

## 2022-11-28 NOTE — Progress Notes (Addendum)
Checked on patient after she arrived from her amputation. She was sleeping but easily awoken and stated she feels good and then went back to sleep. L residual limb wrapped in clean bandage without drainage. Family members bedside and asked about pain regimen. Discussed the different medications she has available based on her pain level. Addressed any questions or concerns.

## 2022-11-28 NOTE — Inpatient Diabetes Management (Signed)
Inpatient Diabetes Program Recommendations  AACE/ADA: New Consensus Statement on Inpatient Glycemic Control (2015)  Target Ranges:  Prepandial:   less than 140 mg/dL      Peak postprandial:   less than 180 mg/dL (1-2 hours)      Critically ill patients:  140 - 180 mg/dL   Lab Results  Component Value Date   GLUCAP 263 (H) 11/28/2022   HGBA1C 11.2 (H) 11/21/2022    Review of Glycemic Control  Latest Reference Range & Units 11/27/22 06:10 11/27/22 12:16 11/27/22 16:50 11/27/22 21:18 11/28/22 06:27  Glucose-Capillary 70 - 99 mg/dL 185 (H) 266 (H) 281 (H) 354 (H) 263 (H)  (H): Data is abnormally high  Diabetes history: DM2 Outpatient Diabetes medications: Amaryl 4 mg QD, Metformin 500 mg BID Current orders for Inpatient glycemic control: Novolog 0-6 units TID  Inpatient Diabetes Program Recommendations:    Might consider small dose of basal insulin:  Semglee 6 units QD (0.1 units x 54.4 kg)  Will continue to follow while inpatient.  Thank you, Reche Dixon, MSN, [place] Diabetes Coordinator Inpatient Diabetes Program [phone] (team pager from 8a-5p)

## 2022-11-28 NOTE — Transfer of Care (Signed)
Immediate Anesthesia Transfer of Care Note  Patient: [name]  Procedure(s) Performed: LEFT AMPUTATION ABOVE KNEE (Left: Knee)  Patient Location: PACU  Anesthesia Type:General  Level of Consciousness: drowsy  Airway & Oxygen Therapy: Patient Spontanous Breathing and Patient connected to face mask oxygen  Post-op Assessment: Report given to RN and Post -op Vital signs reviewed and stable  Post vital signs: Reviewed and stable  Last Vitals:  Vitals Value Taken Time  BP 131/66 11/28/22 1355  Temp    Pulse 91 11/28/22 1357  Resp 15 11/28/22 1357  SpO2 100 % 11/28/22 1357  Vitals shown include unvalidated device data.  Last Pain:  Vitals:   11/28/22 1155  TempSrc: Oral  PainSc:       Patients Stated Pain Goal: 0 (40/34/74 2595)  Complications: No notable events documented.

## 2022-11-28 NOTE — Evaluation (Signed)
Clinical/Bedside Swallow Evaluation Patient Details  Name: Wendy [name] MRN: [id number] Date of Birth: Oct 29, 1941  Today's Date: 11/28/2022 Time: SLP Start Time (ACUTE ONLY): 1107 SLP Stop Time (ACUTE ONLY): 3086 SLP Time Calculation (min) (ACUTE ONLY): 12 min  Past Medical History:  Past Medical History:  Diagnosis Date   Acquired hammer toes of both feet    Anemia    Anxiety    Arthritis    Chronic kidney disease    Diabetes mellitus without complication (HCC)    Eczema    Environmental allergies    Fibrocystic breast changes    GERD (gastroesophageal reflux disease)    Hearing loss    Hypercholesteremia    Hypertension    IBS (irritable bowel syndrome)    Incontinence    Memory loss    Osteoarthritis    PVD (peripheral vascular disease) ([place])    Rheumatoid arthritis ([place])    Past Surgical History:  Past Surgical History:  Procedure Laterality Date   ABDOMINAL AORTOGRAM W/LOWER EXTREMITY N/A 11/12/2022   Procedure: ABDOMINAL AORTOGRAM W/LOWER EXTREMITY;  Surgeon: Waynetta Sandy, MD;  Location: [facility];  Service: Cardiovascular;  Laterality: N/A;   ABDOMINAL HYSTERECTOMY     BYPASS GRAFT FEMORAL-PERONEAL Left 11/22/2022   Procedure: LEFT COMMON FEMORAL-PERONEAL ARTERY BYPASS;  Surgeon: Serafina Mitchell, MD;  Location: [facility];  Service: Vascular;  Laterality: Left;   SHOULDER SURGERY     FATTY TUMOR REMOVED   THROMBECTOMY FEMORAL ARTERY Left 11/23/2022   Procedure: THROMBECTOMY LEFT FEMORAL ARTERY TO POPLITEAL ARTERY BYPASS GRAFT;  Surgeon: Serafina Mitchell, MD;  Location: [facility] OR;  Service: Vascular;  Laterality: Left;   HPI:  Patient is an 82 y.o. female with PMH: HTN, DM-2,  CKD IIIb, dementia. She presented to the hospital with bilateral leg pain secondary to chronic severe critical limb ischemia. She was afebrile with mild leukocytosis on arrival with low concern for sepsis. SLP swallow evaluation ordered secondary to patient with difficulty taking  medications, difficulty with solid foods and family reporting h/o esophageal dysphagia. Swallow evaluated 12/30 with recs to continue regular solids to allow more options; family to select foods that pt is able to eat. Thin liquids recommended. There were no concerns for aspiration. New consult received due to family having increased concerns re: pocketing of solids.    Assessment / Plan / Recommendation  Clinical Impression  Evaluation was limited given pt is NPO for procedure this afternoon. Session spent gathering information from family at bedside.  Pt participated in oral mechanism exam, which was normal excluding absence of teeth. She was more alert and interactive than when last seen by SLP. She was observed to take meds with water per RN and there was good oral attention/control and no s/s of aspiration. Pt's daughter expressed concern about the oral pocketing that her mother has been exhibiting while eating for ~one year. She oftent has to manually remove food from her mother's mouth. We talked about oral holding being a hallmark characteristic of changes related to dementia. She agrees it may be beneficial to downgrade her mother's diet to purees. When she returns from surgery, please order  a dysphagia 1 diet; thin liquids; continue meds whole in water. SLP will f/u briefly to determine if this diet is more manageable for Wendy [name]. Family agrees with plan. SLP Visit Diagnosis: Dysphagia, unspecified (R13.10)    Aspiration Risk  No limitations    Diet Recommendation   Dysphagia 1/thin liquids  Medication Administration: Whole meds  with liquid    Other  Recommendations Oral Care Recommendations: Oral care BID    Recommendations for follow up therapy are one component of a multi-disciplinary discharge planning process, led by the attending physician.  Recommendations may be updated based on patient status, additional functional criteria and insurance authorization.  Follow up Recommendations  No SLP follow up              Frequency and Duration min 1 x/week  1 week               Swallow Study   General Date of Onset: 11/24/22 HPI: Patient is an 82 y.o. female with PMH: HTN, DM-2,  CKD IIIb, dementia. She presented to the hospital with bilateral leg pain secondary to chronic severe critical limb ischemia. She was afebrile with mild leukocytosis on arrival with low concern for sepsis. SLP swallow evaluation ordered secondary to patient with difficulty taking medications, difficulty with solid foods and family reporting h/o esophageal dysphagia. Swallow evaluated 12/30 with recs to continue regular solids to allow more options; family to select foods that pt is able to eat. Thin liquids recommended. There were no concerns for aspiration. New consult received due to family having increased concerns re: pocketing of solids. Type of Study: Bedside Swallow Evaluation Previous Swallow Assessment: see HPI Diet Prior to this Study: Regular;Thin liquids Temperature Spikes Noted: No Respiratory Status: Room air History of Recent Intubation: No Behavior/Cognition: Alert Oral Cavity Assessment: Within Functional Limits Oral Care Completed by SLP: No Oral Cavity - Dentition: Edentulous Vision: Functional for self-feeding Self-Feeding Abilities: Needs assist Patient Positioning: Upright in bed Baseline Vocal Quality: Low vocal intensity Volitional Cough: Strong Volitional Swallow: Able to elicit    Oral/Motor/Sensory Function Overall Oral Motor/Sensory Function: Within functional limits   Ice Chips Ice chips: Not tested   Thin Liquid Thin Liquid: Within functional limits Presentation: Straw    Nectar Thick Nectar Thick Liquid: Not tested   Honey Thick Honey Thick Liquid: Not tested   Puree Puree: Not tested   Solid     Solid: Not tested      Juan Quam Laurice 11/28/2022,11:33 AM  Estill Bamberg L. Tivis Ringer, MA CCC/SLP Clinical Specialist - [place] Office number [phone]

## 2022-11-28 NOTE — Anesthesia Postprocedure Evaluation (Signed)
Anesthesia Post Note  Patient: Wendy [name]  Procedure(s) Performed: LEFT AMPUTATION ABOVE KNEE (Left: Knee)     Patient location during evaluation: PACU Anesthesia Type: General Level of consciousness: awake and alert Pain management: pain level controlled Vital Signs Assessment: post-procedure vital signs reviewed and stable Respiratory status: spontaneous breathing, nonlabored ventilation, respiratory function stable and patient connected to nasal cannula oxygen Cardiovascular status: blood pressure returned to baseline and stable Postop Assessment: no apparent nausea or vomiting Anesthetic complications: no  No notable events documented.  Last Vitals:  Vitals:   11/28/22 1445 11/28/22 1504  BP: 127/71 123/65  Pulse: 87 83  Resp: 13 16  Temp: 36.8 C 36.9 C  SpO2: 96% 100%    Last Pain:  Vitals:   11/28/22 1504  TempSrc: Oral  PainSc: 0-No pain                 Jahziah Simonin S

## 2022-11-28 NOTE — Anesthesia Procedure Notes (Signed)
Procedure Name: Intubation Date/Time: 11/28/2022 12:53 PM  Performed by: Bryson Corona, CRNAPre-anesthesia Checklist: Patient identified, Emergency Drugs available, Suction available and Patient being monitored Patient Re-evaluated:Patient Re-evaluated prior to induction Oxygen Delivery Method: Circle System Utilized Preoxygenation: Pre-oxygenation with 100% oxygen Induction Type: IV induction Ventilation: Mask ventilation without difficulty Laryngoscope Size: Mac and 3 Grade View: Grade I Tube type: Oral Tube size: 7.0 mm Number of attempts: 1 Airway Equipment and Method: Stylet and Oral airway Placement Confirmation: ETT inserted through vocal cords under direct vision, positive ETCO2 and breath sounds checked- equal and bilateral Secured at: 22 cm Tube secured with: Tape Dental Injury: Teeth and Oropharynx as per pre-operative assessment

## 2022-11-28 NOTE — Anesthesia Procedure Notes (Signed)
Anesthesia Regional Block: Femoral nerve block   Pre-Anesthetic Checklist: , timeout performed,  Correct Patient, Correct Site, Correct Laterality,  Correct Procedure, Correct Position, site marked,  Risks and benefits discussed,  Surgical consent,  Pre-op evaluation,  At surgeon's request and post-op pain management  Laterality: Left  Prep: chloraprep       Needles:  Injection technique: Single-shot  Needle Type: Echogenic Needle     Needle Length: 9cm      Additional Needles:   Procedures:,,,, ultrasound used (permanent image in chart),,    Narrative:  Start time: 11/28/2022 12:15 PM End time: 11/28/2022 12:24 PM Injection made incrementally with aspirations every 5 mL.  Performed by: Personally  Anesthesiologist: Myrtie Soman, MD  Additional Notes: Patient tolerated the procedure well without complications

## 2022-11-28 NOTE — Progress Notes (Signed)
Patient returned to 4E from PACU. Vitals taken and stable. Patient oriented to unit and staff. Left leg assessed and clean, dry, intact. Family at the bedside. Call bell within reach.  Wendy [name]

## 2022-11-28 NOTE — Anesthesia Procedure Notes (Signed)
Anesthesia Procedure Image    

## 2022-11-28 NOTE — Op Note (Signed)
    OPERATIVE NOTE   PROCEDURE: Left above-the-knee amputation  PRE-OPERATIVE DIAGNOSIS: left foot gangrene  POST-OPERATIVE DIAGNOSIS: same as above  SURGEON: Marty Heck, MD  ASSISTANT(S): Arlee Muslim, PA  ANESTHESIA: general  ESTIMATED BLOOD LOSS: <75 mL  FINDING(S): Left above-knee amputation with healthy tissue margins.  SPECIMEN(S):  left above-the-knee amputation  INDICATIONS:   Wendy [name] is a 82 y.o. female who presents with left foot gangrene and failed lower extremity bypass.  The patient is scheduled for a left above-the-knee amputation.  I discussed in depth with the patient the risks, benefits, and alternatives to this procedure.  The patient is aware that the risk of this operation included but are not limited to:  bleeding, infection, myocardial infarction, stroke, death, failure to heal amputation wound, and possible need for more proximal amputation.  The patient is aware of the risks and agrees proceed forward with the procedure.  DESCRIPTION: After full informed written consent was obtained from the patient, the patient was brought back to the operating room, and placed supine upon the operating table.  Prior to induction, the patient received IV antibiotics.  The patient was then prepped and draped in the standard fashion for an above-the-knee amputation.  I marked out the anterior and posterior flaps for a fish-mouth type of amputation. I made the incisions for these flaps, and then dissected through the subcutaneous tissue, fascia, and muscles circumferentially.  I elevated  the periosteal tissue more proximal than the anterior skin flap.  I then transected the femur with a power saw at this level.  The previous PTFE bypass was visualized and this was transected high in the wound bed and then ligated with a 2-0 silk suture.  The bypass was occluded with no flow.  Then I completed the posterior flap.  At this point, the specimen was passed off the field as  the above-the-knee amputation.  At this point, I clamped all visibly bleeding arteries and veins using a combination of suture ligation with silk suture and electrocautery.  Bleeding continued to be controlled with electrocautery and suture ligature.  The stump was washed off with sterile normal saline and no further active bleeding was noted.  I reapproximated the anterior and posterior fascia  with interrupted stitches of 2-0 Vicryl.  This was completed along the entire length of anterior and posterior fascia until there were no more loose space in the fascial line.  The skin was then  reapproximated with staples.  The stump was washed off and dried.  The incision was dressed with [employer] and  then fluffs were applied.  Kerlix was wrapped around the leg and then gently an ACE wrap was applied.    COMPLICATIONS: None  CONDITION: Stable  Marty Heck, MD [facility] Office: [place]   11/28/2022, 2:28 PM

## 2022-11-29 ENCOUNTER — Encounter (HOSPITAL_COMMUNITY): Payer: Self-pay | Admitting: Vascular Surgery

## 2022-11-29 DIAGNOSIS — Z89612 Acquired absence of left leg above knee: Secondary | ICD-10-CM | POA: Diagnosis not present

## 2022-11-29 DIAGNOSIS — D62 Acute posthemorrhagic anemia: Secondary | ICD-10-CM | POA: Diagnosis not present

## 2022-11-29 DIAGNOSIS — E1122 Type 2 diabetes mellitus with diabetic chronic kidney disease: Secondary | ICD-10-CM | POA: Diagnosis not present

## 2022-11-29 DIAGNOSIS — N181 Chronic kidney disease, stage 1: Secondary | ICD-10-CM | POA: Diagnosis not present

## 2022-11-29 LAB — BASIC METABOLIC PANEL
Anion gap: 8 (ref 5–15)
BUN: 21 mg/dL (ref 8–23)
CO2: 22 mmol/L (ref 22–32)
Calcium: 8.5 mg/dL — ABNORMAL LOW (ref 8.9–10.3)
Chloride: 103 mmol/L (ref 98–111)
Creatinine, Ser: 1.38 mg/dL — ABNORMAL HIGH (ref 0.44–1.00)
GFR, Estimated: 38 mL/min — ABNORMAL LOW (ref >60)
Glucose, Bld: 420 mg/dL — ABNORMAL HIGH (ref 70–99)
Potassium: 5.7 mmol/L — ABNORMAL HIGH (ref 3.5–5.1)
Sodium: 133 mmol/L — ABNORMAL LOW (ref 135–145)

## 2022-11-29 LAB — CBC
HCT: 27.1 % — ABNORMAL LOW (ref 36.0–46.0)
Hemoglobin: 8.6 g/dL — ABNORMAL LOW (ref 12.0–15.0)
MCH: 24.6 pg — ABNORMAL LOW (ref 26.0–34.0)
MCHC: 31.7 g/dL (ref 30.0–36.0)
MCV: 77.4 fL — ABNORMAL LOW (ref 80.0–100.0)
Platelets: 483 10*3/uL — ABNORMAL HIGH (ref 150–400)
RBC: 3.5 MIL/uL — ABNORMAL LOW (ref 3.87–5.11)
RDW: 17.2 % — ABNORMAL HIGH (ref 11.5–15.5)
WBC: 14.6 10*3/uL — ABNORMAL HIGH (ref 4.0–10.5)
nRBC: 0 % (ref 0.0–0.2)

## 2022-11-29 LAB — GLUCOSE, CAPILLARY
Glucose-Capillary: 364 mg/dL — ABNORMAL HIGH (ref 70–99)
Glucose-Capillary: 364 mg/dL — ABNORMAL HIGH (ref 70–99)
Glucose-Capillary: 411 mg/dL — ABNORMAL HIGH (ref 70–99)
Glucose-Capillary: 466 mg/dL — ABNORMAL HIGH (ref 70–99)
Glucose-Capillary: 375 mg/dL — ABNORMAL HIGH (ref 70–99)

## 2022-11-29 LAB — URINE CULTURE: Culture: NO GROWTH

## 2022-11-29 MED ORDER — INSULIN ASPART 100 UNIT/ML IJ SOLN
0.0000 [IU] | Freq: Three times a day (TID) | INTRAMUSCULAR | Status: DC
  Administered 2022-11-29: 9 [IU] via SUBCUTANEOUS
  Administered 2022-11-30: 7 [IU] via SUBCUTANEOUS
  Administered 2022-11-30: 9 [IU] via SUBCUTANEOUS
  Administered 2022-11-30: 5 [IU] via SUBCUTANEOUS
  Administered 2022-12-01: 9 [IU] via SUBCUTANEOUS
  Administered 2022-12-01: 7 [IU] via SUBCUTANEOUS
  Administered 2022-12-01: 3 [IU] via SUBCUTANEOUS

## 2022-11-29 MED ORDER — POLYETHYLENE GLYCOL 3350 17 G PO PACK
17.0000 g | PACK | Freq: Two times a day (BID) | ORAL | Status: DC
  Administered 2022-11-29 – 2022-12-04 (×5): 17 g via ORAL
  Filled 2022-11-29 (×8): qty 1

## 2022-11-29 MED ORDER — OXYCODONE HCL 5 MG PO TABS
2.5000 mg | ORAL_TABLET | Freq: Once | ORAL | Status: AC
  Administered 2022-11-29: 2.5 mg via ORAL
  Filled 2022-11-29: qty 1

## 2022-11-29 MED ORDER — OXYCODONE HCL 5 MG PO TABS
2.5000 mg | ORAL_TABLET | Freq: Every evening | ORAL | Status: DC | PRN
  Administered 2022-12-04: 2.5 mg via ORAL
  Filled 2022-11-29: qty 1

## 2022-11-29 MED ORDER — SENNOSIDES-DOCUSATE SODIUM 8.6-50 MG PO TABS
2.0000 | ORAL_TABLET | Freq: Every day | ORAL | Status: DC
  Administered 2022-11-30: 2 via ORAL
  Filled 2022-11-29: qty 2

## 2022-11-29 MED ORDER — SODIUM ZIRCONIUM CYCLOSILICATE 10 G PO PACK
10.0000 g | PACK | Freq: Once | ORAL | Status: AC
  Administered 2022-11-29: 10 g via ORAL
  Filled 2022-11-29: qty 1

## 2022-11-29 NOTE — Progress Notes (Addendum)
  Progress Note    11/29/2022 7:36 AM 1 Day Post-Op  Subjective:  doing well, having some soreness at left amputation site. R foot still painful but not as bad     Vitals:   11/28/22 2355 11/29/22 0400  BP: (!) 158/68 (!) 161/84  Pulse: 74 80  Resp: 18 18  Temp: 98.1 F (36.7 C) 97.9 F (36.6 C)  SpO2: 100% 100%    Physical Exam: Cardiac:  regular Lungs:  nonlabored Incisions:  L groin incision intact and dry. L AKA dressed and dry Extremities:  R foot with peroneal doppler signal  CBC    Component Value Date/Time   WBC 14.6 (H) 11/29/2022 0116   RBC 3.50 (L) 11/29/2022 0116   HGB 8.6 (L) 11/29/2022 0116   HGB 9.0 (L) 05/03/2020 1005   HCT 27.1 (L) 11/29/2022 0116   HCT 28.9 (L) 05/03/2020 1005   PLT 483 (H) 11/29/2022 0116   PLT 476 (H) 05/03/2020 1005   MCV 77.4 (L) 11/29/2022 0116   MCV 78 (L) 05/03/2020 1005   MCH 24.6 (L) 11/29/2022 0116   MCHC 31.7 11/29/2022 0116   RDW 17.2 (H) 11/29/2022 0116   RDW 15.5 (H) 05/03/2020 1005   LYMPHSABS 1.4 11/20/2022 1638   LYMPHSABS 1.8 05/03/2020 1005   MONOABS 1.2 (H) 11/20/2022 1638   EOSABS 0.0 11/20/2022 1638   EOSABS 0.1 05/03/2020 1005   BASOSABS 0.0 11/20/2022 1638   BASOSABS 0.1 05/03/2020 1005    BMET    Component Value Date/Time   NA 133 (L) 11/29/2022 0116   NA 140 07/26/2020 1506   K 5.7 (H) 11/29/2022 0116   CL 103 11/29/2022 0116   CO2 22 11/29/2022 0116   GLUCOSE 420 (H) 11/29/2022 0116   BUN 21 11/29/2022 0116   BUN 37 (H) 07/26/2020 1506   CREATININE 1.38 (H) 11/29/2022 0116   CALCIUM 8.5 (L) 11/29/2022 0116   CALCIUM 10.1 06/07/2022 0938   GFRNONAA 38 (L) 11/29/2022 0116   GFRAA 32 (L) 08/02/2020 1410    INR    Component Value Date/Time   INR 1.2 11/22/2022 0628     Intake/Output Summary (Last 24 hours) at 11/29/2022 0736 Last data filed at 11/29/2022 0630 Gross per 24 hour  Intake 770 ml  Output 1400 ml  Net -630 ml      Assessment/Plan:  82 y.o. female is s/p L femoral to  peroneal artery bypass with PTFE, resulting in L AKA on 1/3  -RLE still painful but has peroneal doppler signal -L AKA dressed and dry. Will take dressing down tomorrow -Pain well controlled -Hemoglobin stable at 8.6, will monitor  Vicente Serene, PA-C [facility] [phone] 11/29/2022 7:36 AM I agree with the above.  I have seen and evaluated the patient.  Will change her left above-knee amputation dressings tomorrow  [name]

## 2022-11-29 NOTE — Progress Notes (Signed)
Routine CBG check resulted 466.  Rechecked at [place] MD request and resulted 411.  Received verbal order to administer 9 units novolog for CBG > 400.  MD also notified that pt needed to be digitally disimpacted due to inability to pass hard, stone-like stools.  Will continue bowel regimen as per orders.

## 2022-11-29 NOTE — Progress Notes (Signed)
Pt with history of dementia, just realizing that she has had AKA.  She is very sad, sobbing in the bed. She does not remember discussion and agreeing to amputation. She is requesting something for anxiety.  Family Med notified, awaiting new orders.

## 2022-11-29 NOTE — Inpatient Diabetes Management (Signed)
Inpatient Diabetes Program Recommendations  AACE/ADA: New Consensus Statement on Inpatient Glycemic Control (2015)  Target Ranges:  Prepandial:   less than 140 mg/dL      Peak postprandial:   less than 180 mg/dL (1-2 hours)      Critically ill patients:  140 - 180 mg/dL   Lab Results  Component Value Date   GLUCAP 364 (H) 11/29/2022   HGBA1C 11.2 (H) 11/21/2022    Review of Glycemic Control  Latest Reference Range & Units 11/28/22 06:27 11/28/22 11:42 11/28/22 13:57 11/28/22 17:15 11/28/22 21:10 11/29/22 06:18 11/29/22 11:32  Glucose-Capillary 70 - 99 mg/dL 263 (H) 182 (H) 163 (H) 172 (H) 375 (H) 364 (H) 364 (H)    Diabetes history: DM2 Outpatient Diabetes medications: Amaryl 4 mg QD, Metformin 500 mg BID Current orders for Inpatient glycemic control:  Novolog 0-6 units TID  Ensure Enlive tid between meals  NOTE: Decadron 4 mg given yesterday. Will have elevated glucose trends for another 24-48 hours.  Inpatient Diabetes Program Recommendations:    -   Semglee 8 units Q24 hours   Will continue to follow while inpatient.  Thank you,  Tama Headings RN, MSN, BC-ADM Inpatient Diabetes Coordinator Team Pager [phone] (8a-5p)

## 2022-11-29 NOTE — Progress Notes (Signed)
Speech Language Pathology Treatment: Dysphagia  Patient Details Name: Wendy [name] MRN: [id number] DOB: May 19, 1941 Today's Date: 11/29/2022 Time: 1650-1710 SLP Time Calculation (min) (ACUTE ONLY): 20 min  Assessment / Plan / Recommendation Clinical Impression  Patient seen by SLP for skilled treatment focused on dysphagia goals. Patient's daughter and patient herself were both engaged in discussion of PO consistencies. No PO's given at this time as patient politely declined. Session focused on education on puree diet consistency and patient's dysphagia. SLP also provided handouts describing puree solids diet as well as information on how to achieve puree diet consistency at home. Daughter feels that patient is able to eat more and is not getting as tired out with puree solids as opposed to soft solids that she had been eating. SLP did inform patient and daughter that if there was a food item she would enjoy that is not available on  hospital menu, family could puree and bring to her. Patient herself feels that puree solids will help with her BM's as she has been constipated of late. She also said that although puree foods are not her favorite, she is willing to eat them if they are what is best for her. SLP answered questions to patient and daughter's satisfaction and recommended that if they have further questions, to feel free to ask RN to contact SLP. In addition, SLP recommending that patient have f/u SLP services at next venue of care if patient and/or family wish to upgrade solid texture PO's. SLP to s/o at this time.   HPI HPI: Patient is an 82 y.o. female with PMH: HTN, DM-2,  CKD IIIb, dementia. She presented to the hospital with bilateral leg pain secondary to chronic severe critical limb ischemia. She was afebrile with mild leukocytosis on arrival with low concern for sepsis. SLP swallow evaluation ordered secondary to patient with difficulty taking medications, difficulty with solid foods and  family reporting h/o esophageal dysphagia. Swallow evaluated 12/30 with recs to continue regular solids to allow more options; family to select foods that pt is able to eat. Thin liquids recommended. There were no concerns for aspiration. New consult received due to family having increased concerns re: pocketing of solids.      SLP Plan  Discharge SLP treatment due to (comment);All goals met      Recommendations for follow up therapy are one component of a multi-disciplinary discharge planning process, led by the attending physician.  Recommendations may be updated based on patient status, additional functional criteria and insurance authorization.    Recommendations  Diet recommendations: Dysphagia 1 (puree);Thin liquid Liquids provided via: Cup;Straw Medication Administration: Whole meds with liquid Supervision: Staff to assist with self feeding;Patient able to self feed;Full supervision/cueing for compensatory strategies Compensations: Small sips/bites Postural Changes and/or Swallow Maneuvers: Seated upright 90 degrees                Oral Care Recommendations: Oral care BID Follow Up Recommendations: Other (comment) (family may request f/u SLP at next venue of care if needed for PO consistency management) Assistance recommended at discharge: Frequent or constant Supervision/Assistance SLP Visit Diagnosis: Dysphagia, unspecified (R13.10) Plan: Discharge SLP treatment due to (comment);All goals met          Sonia Baller, MA, CCC-SLP Speech Therapy

## 2022-11-29 NOTE — Progress Notes (Signed)
Daily Progress Note Intern Pager: [phone]  Patient name: Wendy [name] Medical record number: [id number] Date of birth: [birth year] Age: 82 y.o. Gender: female  Primary Care Provider: Default, Provider, MD Consultants: Palliative, Vascular Surgery, Podiatry  Code Status: Full   Pt Overview and Major Events to Date:  11/21/22: Admitted, vascular consulted 11/22/22: L leg femoral to peroneal artery bypass, podiatry consulted 11/26/22: Per conversation with Palliative, family decided to proceed with left AKA 11/28/22: Left AKA  Assessment and Plan:  Wendy [name] is a 82 y.o. female who was admitted with bilateral leg pain found to have worsening chronic critical limb ischemia, now s/p femoral to peroneal artery bypass with plans for palliative LLE amputation. Pertinent PMH/PSH includes HTN, CKD, T2DM, Sjrogen's syndrome, Anemia, GERD, HLD.   * Critical limb ischemia of both lower extremities (HCC) POD #1 left AKA. VSS. Vascular to take dressing down tomorrow. Hg drop from 10.1->8.6, expected post surgery. Patient asymptomatic. - Vascular following, appreciate recs - Podiatry following, appreciate recs - AM CBC - Palliative care following, appreciate recs - Stop  Vanc and ceftriaxone - Increase Miralax and Senna - Daily ASA - Pain Management:   Tylenol 1000 mg q6h PRN pain, fever Gabapentin '200mg'$  TID  Tramadol '50mg'$  q12h (adjusted per renal function) PRN moderate pain  Oxycodone 2.'5mg'$  QHS (per family wishes) PRN for severe pain  Dilaudid 0.'5mg'$  q3h PRN for severe breakthrough pain  Essential hypertension Home medications include clonidine patch 0.3 mg weekly, irbesartan 75 mg, hydrochlorothiazide 12.5 mg, metoprolol succinate 25 mg. BP permissible elevation in hospital setting after surgery. - Continue home Metoprolol '25mg'$  daily  - Holding Irbesartan due to hyperkalemia.  - Holding HCTZ given poor PO intake and renal function - Continue Amlodipine '5mg'$  daily    Hyperkalemia K+ 5.7 overnight. S/p Lokelma 10g. - AM BMP  Type 2 diabetes mellitus with kidney complication, without long-term current use of insulin (HCC) Patient receive dexamethasone prior to surgery. CBGs elevated to 300s. - CBGs QAC and QHS - change to sensitive SSI  Fever Afebrile. Bcx NGTD. Urine culture no growth. Likely due to cellulitis.   -Monitor for signs/symptoms of worsening or new infection -Stop Continue CTX, vanc  -Encouraged to try to limit number of blankets as this could be contributing -Tylenol PRN   Acute blood loss anemia Stable. Hgb 8.6 s/p left AKA. Completed last transfusion on 12/31. Continue to monitor. - AM CBC, if stable consider restarting subq heparin for VTE ppx    FEN/GI: Dysphagia 1 PPx: None in context of anemia, see above for plan for restarting Dispo:Pending clinical improvement, likely SNF  Subjective:  Left AKA yesterday without issues.   Objective: Temp:  [97.9 F (36.6 C)-98.4 F (36.9 C)] 98.2 F (36.8 C) (01/04 0739) Pulse Rate:  [69-95] 69 (01/04 0739) Resp:  [12-20] 18 (01/04 0739) BP: (123-161)/(64-84) 146/80 (01/04 0739) SpO2:  [96 %-100 %] 100 % (01/04 0739) Physical Exam: General: NAD, lying comfortably in hospital bed Cardiovascular: RRR, no murmurs, no peripheral edema Respiratory: normal WOB on RA, CTAB, no wheezes, ronchi or rales Extremities: S/p left AKA, bandaged   Laboratory: Most recent CBC Lab Results  Component Value Date   WBC 14.6 (H) 11/29/2022   HGB 8.6 (L) 11/29/2022   HCT 27.1 (L) 11/29/2022   MCV 77.4 (L) 11/29/2022   PLT 483 (H) 11/29/2022   Most recent BMP    Latest Ref Rng & Units 11/29/2022    1:16 AM  BMP  Glucose 70 -  99 mg/dL 420   BUN 8 - 23 mg/dL 21   Creatinine 0.44 - 1.00 mg/dL 1.38   Sodium 135 - 145 mmol/L 133   Potassium 3.5 - 5.1 mmol/L 5.7   Chloride 98 - 111 mmol/L 103   CO2 22 - 32 mmol/L 22   Calcium 8.9 - 10.3 mg/dL 8.5     Other pertinent labs: Glu - 364    Imaging/Diagnostic Tests: No new imaging.  Salvadore Oxford, MD 11/29/2022, 12:30 PM  PGY-1, [place] Intern pager: [phone], text pages welcome Secure chat group [place]

## 2022-11-30 LAB — BASIC METABOLIC PANEL
Anion gap: 8 (ref 5–15)
Anion gap: 7 (ref 5–15)
BUN: 34 mg/dL — ABNORMAL HIGH (ref 8–23)
BUN: 39 mg/dL — ABNORMAL HIGH (ref 8–23)
CO2: 23 mmol/L (ref 22–32)
CO2: 27 mmol/L (ref 22–32)
Calcium: 8.4 mg/dL — ABNORMAL LOW (ref 8.9–10.3)
Calcium: 8.9 mg/dL (ref 8.9–10.3)
Chloride: 105 mmol/L (ref 98–111)
Chloride: 103 mmol/L (ref 98–111)
Creatinine, Ser: 1.35 mg/dL — ABNORMAL HIGH (ref 0.44–1.00)
Creatinine, Ser: 1.37 mg/dL — ABNORMAL HIGH (ref 0.44–1.00)
GFR, Estimated: 39 mL/min — ABNORMAL LOW (ref >60)
GFR, Estimated: 38 mL/min — ABNORMAL LOW (ref >60)
Glucose, Bld: 458 mg/dL — ABNORMAL HIGH (ref 70–99)
Glucose, Bld: 300 mg/dL — ABNORMAL HIGH (ref 70–99)
Potassium: 5.5 mmol/L — ABNORMAL HIGH (ref 3.5–5.1)
Potassium: 4.8 mmol/L (ref 3.5–5.1)
Sodium: 136 mmol/L (ref 135–145)
Sodium: 137 mmol/L (ref 135–145)

## 2022-11-30 LAB — CBC
HCT: 27 % — ABNORMAL LOW (ref 36.0–46.0)
Hemoglobin: 8.3 g/dL — ABNORMAL LOW (ref 12.0–15.0)
MCH: 24.1 pg — ABNORMAL LOW (ref 26.0–34.0)
MCHC: 30.7 g/dL (ref 30.0–36.0)
MCV: 78.5 fL — ABNORMAL LOW (ref 80.0–100.0)
Platelets: 520 10*3/uL — ABNORMAL HIGH (ref 150–400)
RBC: 3.44 MIL/uL — ABNORMAL LOW (ref 3.87–5.11)
RDW: 17 % — ABNORMAL HIGH (ref 11.5–15.5)
WBC: 13.3 10*3/uL — ABNORMAL HIGH (ref 4.0–10.5)
nRBC: 0 % (ref 0.0–0.2)

## 2022-11-30 LAB — GLUCOSE, CAPILLARY
Glucose-Capillary: 361 mg/dL — ABNORMAL HIGH (ref 70–99)
Glucose-Capillary: 302 mg/dL — ABNORMAL HIGH (ref 70–99)
Glucose-Capillary: 272 mg/dL — ABNORMAL HIGH (ref 70–99)
Glucose-Capillary: 349 mg/dL — ABNORMAL HIGH (ref 70–99)

## 2022-11-30 LAB — SURGICAL PATHOLOGY

## 2022-11-30 MED ORDER — INSULIN GLARGINE-YFGN 100 UNIT/ML ~~LOC~~ SOLN
5.0000 [IU] | Freq: Every day | SUBCUTANEOUS | Status: DC
  Administered 2022-11-30 – 2022-12-01 (×2): 5 [IU] via SUBCUTANEOUS
  Filled 2022-11-30 (×2): qty 0.05

## 2022-11-30 MED ORDER — SODIUM ZIRCONIUM CYCLOSILICATE 10 G PO PACK
10.0000 g | PACK | Freq: Once | ORAL | Status: AC
  Administered 2022-11-30: 10 g via ORAL
  Filled 2022-11-30: qty 1

## 2022-11-30 MED ORDER — SENNOSIDES-DOCUSATE SODIUM 8.6-50 MG PO TABS
2.0000 | ORAL_TABLET | Freq: Two times a day (BID) | ORAL | Status: DC
  Administered 2022-11-30 – 2022-12-04 (×3): 2 via ORAL
  Filled 2022-11-30 (×6): qty 2

## 2022-11-30 MED ORDER — HEPARIN SODIUM (PORCINE) 5000 UNIT/ML IJ SOLN
5000.0000 [IU] | Freq: Three times a day (TID) | INTRAMUSCULAR | Status: DC
  Administered 2022-11-30 – 2022-12-04 (×14): 5000 [IU] via SUBCUTANEOUS
  Filled 2022-11-30 (×14): qty 1

## 2022-11-30 NOTE — Progress Notes (Signed)
Daily Progress Note Intern Pager: [phone]  Patient name: Wendy [name] Medical record number: [id number] Date of birth: 1941/08/28 Age: 82 y.o. Gender: female  Primary Care Provider: Default, Provider, MD Consultants: Vascular surg, podiatry, palliative Code Status: Full  Pt Overview and Major Events to Date:  11/21/22: Admitted, vascular consulted 11/22/22: L leg femoral to peroneal artery bypass, podiatry consulted 11/26/22: Per conversation with Palliative, family decided to proceed with left AKA 11/28/22: Left AKA  Assessment and Plan: Wendy [name] is a 82 y.o. female who was admitted with bilateral leg pain found to have worsening chronic critical limb ischemia, now s/p femoral to peroneal artery bypass and AKA of L LE.  Pertinent PMH/PSH includes HTN, CKD, T2DM, Sjrogen's syndrome, Anemia, GERD, HLD.   * Critical limb ischemia of both lower extremities (HCC) POD 2 L AKA. VSS, pain controlled. Hgb stable.  - Vascular following, appreciate recs. Plan to remove bandages today - Podiatry following, appreciate recs - Palliative following, appreciate recs. - TOC consult for placement - Hgb stable, start Heparin for DVT ppx - Pain controlled - Daily ASA - Pain Management:   Tylenol 1000 mg q6h PRN pain, fever Gabapentin '200mg'$  TID  Tramadol '50mg'$  q12h (adjusted per renal function) PRN moderate pain  Oxycodone 2.'5mg'$  QHS (per family wishes) PRN for severe pain  Dilaudid 0.'5mg'$  q3h PRN for severe breakthrough pain - Miralax, senna, ducolax for bowel reg  Essential hypertension Home medications include clonidine patch 0.3 mg weekly, irbesartan 75 mg, hydrochlorothiazide 12.5 mg, metoprolol succinate 25 mg.  - Cont home metoprolol '25mg'$  daily - Cont amlodipine 5 - Holding irbesartan and HCTZ given hyperkalemia  Hyperkalemia K this AM was hemolysed, will repeat. Has received Lokelma x2. - f/u BMP - If repeat K still elevated, plan to increase bowel reg to improve K  excretion   Type 2 diabetes mellitus with kidney complication, without long-term current use of insulin (HCC) CBG's elevated 300-400's. - Start 5u Glargine daily for basal coverage - Cont sSSI  Acute blood loss anemia Hgb stable. - SubQ Hep for DVT ppx   FEN/GI: dysphagia 1 PPx: SQ Hep Dispo:SNF tomorrow. Barriers include pending SNF bed placement.   Subjective:  Had manual disimpaction yesterday. Denies pain or discomfort at this time.   Objective: Temp:  [97.7 F (36.5 C)-98.5 F (36.9 C)] 98.5 F (36.9 C) (01/05 1212) Pulse Rate:  [77-85] 85 (01/05 0758) Resp:  [18-20] 20 (01/05 1212) BP: (145-162)/(66-89) 162/89 (01/05 0400) SpO2:  [99 %-100 %] 99 % (01/05 0758) Physical Exam: General: Pleasant, alert, sitting up in bed eating breakfast. NAD. Cardiovascular: RRR Respiratory: Normal WOB on RA. CTAB, no wheezing or crackles. Abdomen: Abm soft, nondistended, nontender. Normal BS.  Ext: L LE bandage in place  Laboratory: Most recent CBC Lab Results  Component Value Date   WBC 13.3 (H) 11/30/2022   HGB 8.3 (L) 11/30/2022   HCT 27.0 (L) 11/30/2022   MCV 78.5 (L) 11/30/2022   PLT 520 (H) 11/30/2022   Most recent BMP    Latest Ref Rng & Units 11/30/2022    2:08 AM  BMP  Glucose 70 - 99 mg/dL 458   BUN 8 - 23 mg/dL 34   Creatinine 0.44 - 1.00 mg/dL 1.35   Sodium 135 - 145 mmol/L 136   Potassium 3.5 - 5.1 mmol/L 5.5   Chloride 98 - 111 mmol/L 105   CO2 22 - 32 mmol/L 23   Calcium 8.9 - 10.3 mg/dL 8.4  Arlyce Dice, MD 11/30/2022, 12:26 PM  PGY-1, [place] Intern pager: [phone], text pages welcome Secure chat group [place]

## 2022-11-30 NOTE — Progress Notes (Addendum)
  Progress Note    11/30/2022 8:38 AM 2 Days Post-Op  Subjective:  less soreness today at amputation site, still some right foot pain    Vitals:   11/30/22 0400 11/30/22 0758  BP: (!) 162/89   Pulse: 84 85  Resp: 20 18  Temp: 98.2 F (36.8 C) 98.4 F (36.9 C)  SpO2: 100% 99%    Physical Exam: Cardiac:  regular Lungs:  nonlabored Incisions:  L groin incision c/d/l. L AKA dressing taken down, incision is intact and dry with staples Extremities:  R foot with ischemic changes to 2nd toe, ulcerations of 3rd and 4th toes. Peroneal doppler signal  CBC    Component Value Date/Time   WBC 13.3 (H) 11/30/2022 0208   RBC 3.44 (L) 11/30/2022 0208   HGB 8.3 (L) 11/30/2022 0208   HGB 9.0 (L) 05/03/2020 1005   HCT 27.0 (L) 11/30/2022 0208   HCT 28.9 (L) 05/03/2020 1005   PLT 520 (H) 11/30/2022 0208   PLT 476 (H) 05/03/2020 1005   MCV 78.5 (L) 11/30/2022 0208   MCV 78 (L) 05/03/2020 1005   MCH 24.1 (L) 11/30/2022 0208   MCHC 30.7 11/30/2022 0208   RDW 17.0 (H) 11/30/2022 0208   RDW 15.5 (H) 05/03/2020 1005   LYMPHSABS 1.4 11/20/2022 1638   LYMPHSABS 1.8 05/03/2020 1005   MONOABS 1.2 (H) 11/20/2022 1638   EOSABS 0.0 11/20/2022 1638   EOSABS 0.1 05/03/2020 1005   BASOSABS 0.0 11/20/2022 1638   BASOSABS 0.1 05/03/2020 1005    BMET    Component Value Date/Time   NA 136 11/30/2022 0208   NA 140 07/26/2020 1506   K 5.5 (H) 11/30/2022 0208   CL 105 11/30/2022 0208   CO2 23 11/30/2022 0208   GLUCOSE 458 (H) 11/30/2022 0208   BUN 34 (H) 11/30/2022 0208   BUN 37 (H) 07/26/2020 1506   CREATININE 1.35 (H) 11/30/2022 0208   CALCIUM 8.4 (L) 11/30/2022 0208   CALCIUM 10.1 06/07/2022 0938   GFRNONAA 39 (L) 11/30/2022 0208   GFRAA 32 (L) 08/02/2020 1410    INR    Component Value Date/Time   INR 1.2 11/22/2022 0628     Intake/Output Summary (Last 24 hours) at 11/30/2022 0838 Last data filed at 11/30/2022 0700 Gross per 24 hour  Intake 240 ml  Output 1700 ml  Net -1460 ml       Assessment/Plan:  82 y.o. female is s/p: L femoral to peroneal artery bypass with PTFE, resulting in L AKA on 1/3    -Pain well controlled -L AKA dressing taken down. AKA healing well with viable tissue -RLE with peroneal doppler signal. Ischemic changes noted to R 2nd toe with ulcerations on 3rd and 4th toes  Vicente Serene, PA-C [facility] [phone] 11/30/2022 8:38 AM  I agree with the above.  I have seen and evaluated the patient.  Her family is at the bedside.  The patient is much more alert today.  She is not having any pain in her stump.  Her dressing was removed and it is healing nicely.  She does have dry skin and healing ulcers on her right foot.  We discussed delaying intervention to the right leg to later, once she has recovered from her amputation.  [name]

## 2022-11-30 NOTE — Progress Notes (Signed)
Physical Therapy Treatment Patient Details Name: Wendy [name] MRN: [id number] DOB: 12-30-1940 Today's Date: 11/30/2022   History of Present Illness Pt is an 82 y/o female admitted on 11/20/22 after presenting with c/o increased BLE pain, worsening wounds L foot. S/p L common femoral to peroneal artery bypass on 12/28. S/p L AKA 1/3. PMH includes DM2, essential HTN, PVD, CKD 3, RA, IBS, memory impairment.   PT Comments    Pt making incremental progress with mobility, now s/p L AKA on 11/28/22. Today's session focused on seated EOB activity and transfer attempts; pt requiring maxA for lateral scooting, unable to clear buttocks despite maxA with standing attempts. Pt limited by generalized weakness, decreased activity tolerance, poor balance strategies/postural reactions and impaired cognition. Difficult to determine extent of true cognitive impairment vs fatigue vs motivation to participate. Continue to recommend SNF-level therapies to maximize functional mobility and decrease caregiver burden.    Recommendations for follow up therapy are one component of a multi-disciplinary discharge planning process, led by the attending physician.  Recommendations may be updated based on patient status, additional functional criteria and insurance authorization.  Follow Up Recommendations  Skilled nursing-short term rehab (<3 hours/day) Can patient physically be transported by private vehicle: No   Assistance Recommended at Discharge Frequent or constant Supervision/Assistance  Patient can return home with the following Two people to help with walking and/or transfers;A lot of help with bathing/dressing/bathroom;Assistance with cooking/housework;Direct supervision/assist for medications management;Direct supervision/assist for financial management;Assist for transportation;Help with stairs or ramp for entrance   Equipment Recommendations  BSC/3in1;Wheelchair (measurements PT);Wheelchair cushion (measurements  PT);Hospital bed;Other (comment) (hoyer lift)    Recommendations for Other Services       Precautions / Restrictions Precautions Precautions: Fall;Other (comment) Precaution Comments: s/p L AKA 1/3 Restrictions Weight Bearing Restrictions: Yes LLE Weight Bearing: Non weight bearing     Mobility  Bed Mobility Overal bed mobility: Needs Assistance Bed Mobility: Supine to Sit, Sit to Supine     Supine to sit: Mod assist, HOB elevated Sit to supine: Mod assist   General bed mobility comments: significant increased time and effort to initiate and sequencing bed mobility, max verbal cues and modA for trunk elevation and scooting hips to EOB; modA for LE management return to supine. pt able to assist scooting up in bed when cued to pull on bed rail    Transfers Overall transfer level: Needs assistance                 General transfer comment: trialled lateral scooting at EOB, pt requiring max verbal/tactile cues for sequencing and maxA to scoot hips with bed pad; trialled standing 2x, pt unable to clear buttocks despite maxA, significant difficulty sequencing mobility despite increased time and cues. will require +2 assist    Ambulation/Gait                   Stairs             Wheelchair Mobility    Modified Rankin (Stroke Patients Only)       Balance Overall balance assessment: Needs assistance Sitting-balance support: No upper extremity supported Sitting balance-Leahy Scale: Fair       Standing balance-Leahy Scale: Zero Standing balance comment: unable to achieve standing this session                            Cognition Arousal/Alertness: Awake/alert Behavior During Therapy: Flat affect Overall Cognitive Status: History of cognitive  impairments - at baseline Area of Impairment: Attention, Memory, Following commands, Safety/judgement, Awareness, Problem solving                   Current Attention Level: Sustained,  Selective Memory: Decreased short-term memory Following Commands: Follows one step commands inconsistently, Follows one step commands with increased time Safety/Judgement: Decreased awareness of safety, Decreased awareness of deficits Awareness: Intellectual Problem Solving: Slow processing, Decreased initiation, Difficulty sequencing, Requires verbal cues, Requires tactile cues General Comments: significant difficulty sequencing mobility. daughter present at beginning and end of session, reports pt near baseline mobility        Exercises      General Comments General comments (skin integrity, edema, etc.): pt's daughter present at beginning and end of session, supportive of therapy's role      Pertinent Vitals/Pain Pain Assessment Pain Assessment: Faces Faces Pain Scale: Hurts even more Pain Location: L residual limb Pain Descriptors / Indicators: Discomfort, Grimacing, Guarding Pain Intervention(s): Monitored during session, Premedicated before session    Home Living                          Prior Function            PT Goals (current goals can now be found in the care plan section) Acute Rehab PT Goals Patient Stated Goal: increased strength and independence PT Goal Formulation: With patient/family Time For Goal Achievement: 12/14/22 Potential to Achieve Goals: Fair Progress towards PT goals: Progressing toward goals    Frequency    Min 2X/week      PT Plan Current plan remains appropriate    Co-evaluation              AM-PAC PT "6 Clicks" Mobility   Outcome Measure  Help needed turning from your back to your side while in a flat bed without using bedrails?: A Lot Help needed moving from lying on your back to sitting on the side of a flat bed without using bedrails?: A Lot Help needed moving to and from a bed to a chair (including a wheelchair)?: Total Help needed standing up from a chair using your arms (e.g., wheelchair or bedside  chair)?: Total Help needed to walk in hospital room?: Total Help needed climbing 3-5 steps with a railing? : Total 6 Click Score: 8    End of Session Equipment Utilized During Treatment: Gait belt Activity Tolerance: Patient tolerated treatment well;Patient limited by fatigue;Patient limited by pain;Other (comment) (limited by cognition) Patient left: in bed;with call bell/phone within reach;with family/visitor present;with bed alarm set Nurse Communication: Mobility status PT Visit Diagnosis: Pain;Difficulty in walking, not elsewhere classified (R26.2);Muscle weakness (generalized) (M62.81) Pain - Right/Left: Left Pain - part of body: Leg;Ankle and joints of foot     Time: 1136-1202 PT Time Calculation (min) (ACUTE ONLY): 26 min  Charges:  $Therapeutic Activity: 23-37 mins                     Mabeline Caras, PT, DPT Acute Rehabilitation Services  Personal: [place] [facility] Office: [place] 11/30/2022, 1:22 PM

## 2022-11-30 NOTE — Progress Notes (Signed)
Pt requiring to be manually disimpacted again due to inability to pass stool on bedpan.  Crying in discomfort.  Hard, large blobs removed, although somewhat softer than yesterday's episode.  Pt tolerated relatively well, reports relief.  Colon felt empty, hopefully she will be able to pass the next one on her own.

## 2022-11-30 NOTE — TOC Progression Note (Addendum)
Transition of Care [facility]) - Progression Note    Patient Details  Name: Wendy [name] MRN: [id number] Date of Birth: [birth year]  Transition of Care [facility]) CM/SW [place], [place] Phone Number: 11/30/2022, 11:12 AM  Clinical Narrative:     CSW met with pt, pt daughter(Wendy [name]), and son in-law bedside. CSW provided SNF bed offers with their Medicare Star ratings. Daughter is interested in [facility] and explains she doesn't want her mother to go to a facility lower than 3 medicare stars. CSW sent bed request to [facility] in hub. TOC will follow for new bed offers/SNF choice.   1500: CSW called pt's daughter, Wendy [name], and provided update that [facility] did not offer a bed. Daughter requests additional referrals be sent though does not have specific facilities she is interested. CSW explains that referrals were sent in [place] and [facility]; discussed surrounding areas that more referrals can be sent to. CSW refers pt's daughter to Medicare.gov to review SNF and their location and star ratings. Daughter states she does not want anything lower than a 3 star facility.  CSW informs daughter that he will send additional referrals out though that daughter will eventually need to pick a facility from the available offers.   CSW faxed referrals to the following additional facilities: [facility]    Expected Discharge Plan: [place] Barriers to Discharge: Insurance Authorization, SNF Pending bed offer  Expected Discharge Plan and Services In-house Referral: Clinical Social Work     Living arrangements for the past 2 months: [place]                                       Social Determinants of Health (SDOH) Interventions [place]: No Food Insecurity (11/22/2022)  Housing: Low Risk  (11/22/2022)  Transportation Needs: No Transportation  Needs (11/22/2022)  Utilities: Not At Risk (11/22/2022)  Depression (PHQ2-9): Medium Risk (12/08/2020)  Financial Resource Strain: Low Risk  (10/05/2020)  Physical Activity: Inactive (10/05/2020)  Social Connections: Socially Isolated (12/08/2020)  Tobacco Use: Low Risk  (11/29/2022)    Readmission Risk Interventions     No data to display

## 2022-11-30 NOTE — Inpatient Diabetes Management (Signed)
Inpatient Diabetes Program Recommendations  AACE/ADA: New Consensus Statement on Inpatient Glycemic Control (2015)  Target Ranges:  Prepandial:   less than 140 mg/dL      Peak postprandial:   less than 180 mg/dL (1-2 hours)      Critically ill patients:  140 - 180 mg/dL    Latest Reference Range & Units 11/29/22 06:18 11/29/22 11:32 11/29/22 16:45 11/29/22 17:01 11/29/22 21:46  Glucose-Capillary 70 - 99 mg/dL 364 (H)  5 units Novolog  364 (H)  5 units Novolog  466 (H) 411 (H)  9 units Novolog  375 (H)  (H): Data is abnormally high  Latest Reference Range & Units 11/30/22 06:51 11/30/22 12:11  Glucose-Capillary 70 - 99 mg/dL 361 (H)  9 units Novolog  302 (H)  7 units Novolog   (H): Data is abnormally high      Home DM Meds: Amaryl 4 mg daily       Metformin 500 mg BID       Actos 15 mg daily  Current Orders: Semglee 5 units QHS      Novolog Sensitive Correction Scale/ SSI (0-9 units) TID AC     MD- Note Semglee 5 units QHS added for tonight  Pt getting Ensure PO supps TID between meals  May also consider increasing the Novolog SSI to the 0-15 unit Moderate scale     --Will follow patient during hospitalization--  Wyn Quaker RN, MSN, [place] Diabetes Coordinator Inpatient Glycemic Control Team Team Pager: [phone] (8a-5p)

## 2022-12-01 DIAGNOSIS — Z89612 Acquired absence of left leg above knee: Secondary | ICD-10-CM | POA: Diagnosis not present

## 2022-12-01 DIAGNOSIS — I998 Other disorder of circulatory system: Secondary | ICD-10-CM | POA: Diagnosis not present

## 2022-12-01 LAB — CBC
HCT: 28.2 % — ABNORMAL LOW (ref 36.0–46.0)
Hemoglobin: 9 g/dL — ABNORMAL LOW (ref 12.0–15.0)
MCH: 24.5 pg — ABNORMAL LOW (ref 26.0–34.0)
MCHC: 31.9 g/dL (ref 30.0–36.0)
MCV: 76.6 fL — ABNORMAL LOW (ref 80.0–100.0)
Platelets: 615 10*3/uL — ABNORMAL HIGH (ref 150–400)
RBC: 3.68 MIL/uL — ABNORMAL LOW (ref 3.87–5.11)
RDW: 16.6 % — ABNORMAL HIGH (ref 11.5–15.5)
WBC: 15.8 10*3/uL — ABNORMAL HIGH (ref 4.0–10.5)
nRBC: 0.2 % (ref 0.0–0.2)

## 2022-12-01 LAB — GLUCOSE, CAPILLARY
Glucose-Capillary: 317 mg/dL — ABNORMAL HIGH (ref 70–99)
Glucose-Capillary: 473 mg/dL — ABNORMAL HIGH (ref 70–99)
Glucose-Capillary: 331 mg/dL — ABNORMAL HIGH (ref 70–99)
Glucose-Capillary: 227 mg/dL — ABNORMAL HIGH (ref 70–99)
Glucose-Capillary: 322 mg/dL — ABNORMAL HIGH (ref 70–99)
Glucose-Capillary: 383 mg/dL — ABNORMAL HIGH (ref 70–99)

## 2022-12-01 LAB — BASIC METABOLIC PANEL
Anion gap: 8 (ref 5–15)
BUN: 36 mg/dL — ABNORMAL HIGH (ref 8–23)
CO2: 28 mmol/L (ref 22–32)
Calcium: 9 mg/dL (ref 8.9–10.3)
Chloride: 102 mmol/L (ref 98–111)
Creatinine, Ser: 1.36 mg/dL — ABNORMAL HIGH (ref 0.44–1.00)
GFR, Estimated: 39 mL/min — ABNORMAL LOW (ref >60)
Glucose, Bld: 351 mg/dL — ABNORMAL HIGH (ref 70–99)
Potassium: 4.3 mmol/L (ref 3.5–5.1)
Sodium: 138 mmol/L (ref 135–145)

## 2022-12-01 LAB — GLUCOSE, RANDOM: Glucose, Bld: 425 mg/dL — ABNORMAL HIGH (ref 70–99)

## 2022-12-01 MED ORDER — INSULIN ASPART 100 UNIT/ML IJ SOLN
0.0000 [IU] | Freq: Every day | INTRAMUSCULAR | Status: DC
  Administered 2022-12-01: 5 [IU] via SUBCUTANEOUS
  Administered 2022-12-02: 3 [IU] via SUBCUTANEOUS
  Administered 2022-12-03: 2 [IU] via SUBCUTANEOUS

## 2022-12-01 MED ORDER — INSULIN GLARGINE-YFGN 100 UNIT/ML ~~LOC~~ SOLN
8.0000 [IU] | Freq: Every day | SUBCUTANEOUS | Status: DC
  Filled 2022-12-01: qty 0.08

## 2022-12-01 MED ORDER — INSULIN GLARGINE-YFGN 100 UNIT/ML ~~LOC~~ SOLN
3.0000 [IU] | Freq: Once | SUBCUTANEOUS | Status: AC
  Administered 2022-12-02: 3 [IU] via SUBCUTANEOUS
  Filled 2022-12-01: qty 0.03

## 2022-12-01 MED ORDER — INSULIN ASPART 100 UNIT/ML IJ SOLN: 0.0000 [IU] | Freq: Three times a day (TID) | INTRAMUSCULAR | Status: DC

## 2022-12-01 MED ORDER — IRBESARTAN 150 MG PO TABS
75.0000 mg | ORAL_TABLET | Freq: Every day | ORAL | Status: DC
  Administered 2022-12-01 – 2022-12-03 (×3): 75 mg via ORAL
  Filled 2022-12-01 (×3): qty 1

## 2022-12-01 NOTE — Progress Notes (Signed)
Daily Progress Note Intern Pager: [phone]  Patient name: Wendy [name] Medical record number: [id number] Date of birth: 09/11/1941 Age: 82 y.o. Gender: female  Primary Care Provider: Default, Provider, MD Consultants: Vascular surgery, Podiatry, Palliative  Code Status: Full  Pt Overview and Major Events to Date:  11/21/22: Admitted, vascular consulted 11/22/22: L leg femoral to peroneal artery bypass, podiatry consulted 11/26/22: Per conversation with Palliative, family decided to proceed with left AKA 11/28/22: Left AKA  Assessment and Plan: TORRIN FREIN is a 82 y.o. female who was admitted with bilateral leg pain found to have worsening chronic critical limb ischemia, now s/p femoral to peroneal artery bypass and AKA of L LE. Pertinent PMH/PSH includes HTN, CKD, T2DM, Sjrogen's syndrome, Anemia, GERD, HLD.   * Critical limb ischemia of both lower extremities (HCC) POD 3 L AKA. VSS, pain controlled. Required 2 injections of dilaudid, one dose of tramadol and oxycodone yesterday. Hgb stable.  - Vascular following, appreciate recs. Dressing removed yesterday, possible intervention on the right once recovered.   - Podiatry following, appreciate recs - Palliative following, appreciate recs. - TOC consult for placement - Hgb stable, start Heparin for DVT ppx - Daily ASA - Pain Management:   Tylenol 1000 mg q6h PRN pain, fever Gabapentin '200mg'$  TID  Tramadol '50mg'$  q12h (adjusted per renal function) PRN moderate pain  Oxycodone 2.'5mg'$  QHS (per family wishes) PRN for severe pain  Dilaudid 0.'5mg'$  q3h PRN for severe breakthrough pain - Miralax BID, senna BID, ducolax for bowel reg  Essential hypertension Pressures elevated to 357-017B systolic. Home medications include clonidine patch 0.3 mg weekly, irbesartan 75 mg, hydrochlorothiazide 12.5 mg, metoprolol succinate 25 mg.  - Cont home metoprolol '25mg'$  daily - Cont amlodipine 5 - Holding irbesartan and HCTZ given hyperkalemia, if  potassium is stable this morning, add back ARB   Hyperkalemia K 5.7 > 5.5 > 4.8 - f/u BMP  Type 2 diabetes mellitus with kidney complication, without long-term [name] use of insulin (HCC) CBG's elevated 300-400's. - Start 5u Glargine daily for basal coverage - Cont sSSI  Acute blood loss anemia Hgb stable. - SubQ Hep for DVT ppx   FEN/GI: dysphagia 1 PPx: SQ Hep  Dispo:SNF today. Barriers include bed placement.   Subjective:  Resting comfortably on exam.  Pain well-controlled  Objective: Temp:  [98.4 F (36.9 C)-99.1 F (37.3 C)] 98.6 F (37 C) (01/06 0411) Pulse Rate:  [85-96] 92 (01/06 0411) Resp:  [18-20] 18 (01/06 0411) BP: (149-171)/(75-84) 171/84 (01/06 0411) SpO2:  [97 %-100 %] 97 % (01/06 0411) Physical Exam: Chronically ill-appearing-appearing, no acute distress Cardio: Regular rate, regular rhythm, no murmurs on exam. Pulm: Clear, no wheezing, no crackles. No increased work of breathing Abdominal: bowel sounds present, soft, non-tender, non-distended Extremities: no peripheral edema  Neuro: alert and oriented x3, speech normal in content, no facial asymmetry, strength intact and equal bilaterally in UE and LE, pupils equal and reactive to light.   Laboratory: Most recent CBC Lab Results  Component Value Date   WBC 13.3 (H) 11/30/2022   HGB 8.3 (L) 11/30/2022   HCT 27.0 (L) 11/30/2022   MCV 78.5 (L) 11/30/2022   PLT 520 (H) 11/30/2022   Most recent BMP    Latest Ref Rng & Units 11/30/2022    4:22 PM  BMP  Glucose 70 - 99 mg/dL 300   BUN 8 - 23 mg/dL 39   Creatinine 0.44 - 1.00 mg/dL 1.37   Sodium 135 - 145 mmol/L  137   Potassium 3.5 - 5.1 mmol/L 4.8   Chloride 98 - 111 mmol/L 103   CO2 22 - 32 mmol/L 27   Calcium 8.9 - 10.3 mg/dL 8.9     Wendy Current, DO 12/01/2022, 5:20 AM  PGY-1, [place] Intern pager: [phone], text pages welcome Secure chat group [email or web address]

## 2022-12-01 NOTE — TOC Progression Note (Signed)
Transition of Care [facility]) - Progression Note    Patient Details  Name: MAEGHAN CANNY MRN: [id number] Date of Birth: Oct 26, 1941  Transition of Care [facility]) CM/SW [place], LCSW Phone Number: 12/01/2022, 10:16 AM  Clinical Narrative:     CSW  checked hub and it does not appear that any new bed offers have come back.  TOC team will continue to assist with discharge planning needs.   Expected Discharge Plan: [place] Barriers to Discharge: Ship broker, SNF Pending bed offer  Expected Discharge Plan and Services In-house Referral: Clinical Social Work     Living arrangements for the past 2 months: [place] Determinants of Health (SDOH) Interventions [place]: No Food Insecurity (11/22/2022)  Housing: Low Risk  (11/22/2022)  Transportation Needs: No Transportation Needs (11/22/2022)  Utilities: Not At Risk (11/22/2022)  Depression (PHQ2-9): Medium Risk (12/08/2020)  Financial Resource Strain: Low Risk  (10/05/2020)  Physical Activity: Inactive (10/05/2020)  Social Connections: Socially Isolated (12/08/2020)  Tobacco Use: Low Risk  (11/29/2022)    Readmission Risk Interventions   No data to display

## 2022-12-01 NOTE — Progress Notes (Addendum)
Progress Note    12/01/2022 7:10 AM 3 Days Post-Op  Subjective:  sleeping-awakes and says she's tired.  Son at bedside.  afebrile  Vitals:   11/30/22 2319 12/01/22 0411  BP: (!) 158/80 (!) 171/84  Pulse: 91 92  Resp: 20 18  Temp: 98.7 F (37.1 C) 98.6 F (37 C)  SpO2: 100% 97%    Physical Exam: General:  no distress Cardiac:  regular Lungs:  non labored Incisions:  left groin incision is clean and dry; left AKA stump is clean with staples in tact Extremities:  +peroneal/AT doppler signals on the right foot  Right foot:    CBC    Component Value Date/Time   WBC 15.8 (H) 12/01/2022 0555   RBC 3.68 (L) 12/01/2022 0555   HGB 9.0 (L) 12/01/2022 0555   HGB 9.0 (L) 05/03/2020 1005   HCT 28.2 (L) 12/01/2022 0555   HCT 28.9 (L) 05/03/2020 1005   PLT 615 (H) 12/01/2022 0555   PLT 476 (H) 05/03/2020 1005   MCV 76.6 (L) 12/01/2022 0555   MCV 78 (L) 05/03/2020 1005   MCH 24.5 (L) 12/01/2022 0555   MCHC 31.9 12/01/2022 0555   RDW 16.6 (H) 12/01/2022 0555   RDW 15.5 (H) 05/03/2020 1005   LYMPHSABS 1.4 11/20/2022 1638   LYMPHSABS 1.8 05/03/2020 1005   MONOABS 1.2 (H) 11/20/2022 1638   EOSABS 0.0 11/20/2022 1638   EOSABS 0.1 05/03/2020 1005   BASOSABS 0.0 11/20/2022 1638   BASOSABS 0.1 05/03/2020 1005    BMET    Component Value Date/Time   NA 138 12/01/2022 0555   NA 140 07/26/2020 1506   K 4.3 12/01/2022 0555   CL 102 12/01/2022 0555   CO2 28 12/01/2022 0555   GLUCOSE 351 (H) 12/01/2022 0555   BUN 36 (H) 12/01/2022 0555   BUN 37 (H) 07/26/2020 1506   CREATININE 1.36 (H) 12/01/2022 0555   CALCIUM 9.0 12/01/2022 0555   CALCIUM 10.1 06/07/2022 0938   GFRNONAA 39 (L) 12/01/2022 0555   GFRAA 32 (L) 08/02/2020 1410    INR    Component Value Date/Time   INR 1.2 11/22/2022 0628     Intake/Output Summary (Last 24 hours) at 12/01/2022 0710 Last data filed at 12/01/2022 0436 Gross per 24 hour  Intake 100 ml  Output 1800 ml  Net -1700 ml       Assessment/Plan:  82 y.o. female is s/p:   Left femoral to peroneal artery bypass graft with 6 mm external ring PTFE  9 Days Post-Op And  Thrombectomy  Left femoral to peroneal artery bypass 8 Days Post-Op And  Left above knee amputation 3 Days Post-Op   -pt left AKA healing with staples in tact -right foot as noted above with monophasic peroneal and AT doppler flow.  Per Dr. Trula Slade, right leg intervention will be delayed until she has recovered from her amputation.  -hyperglycemia with blood sugars difficult to control in 300-400's.  Needs better control for wound healing.   Pt was on Metformin, Actos and Amaryl prior to admission.  Will defer to primary team for management.  -DVT prophylaxis:  sq heparin   Leontine Locket, PA-C [facility] [phone] 12/01/2022 7:10 AM  VASCULAR STAFF ADDENDUM: I have independently interviewed and examined the patient. I agree with the above.  During my visit, Aarian was awake.  She appears very frail and deconditioned. Complaining this morning of sacral pain.  Due to her size and frailty, I am concerned for early stages  of sacral decubitus ulcer.  I have asked nursing to evaluate this after she eats breakfast Son was very concerned about her breakfast getting cold, therefore we have tried to adjust her in the bed in an effort to set her up so she could eat.  He is helping feed her. AKA site looks good.  No operative plans for right leg at this time.   Cassandria Santee, MD [facility] Phone Number: [phone] 12/01/2022 9:22 AM

## 2022-12-02 DIAGNOSIS — R64 Cachexia: Secondary | ICD-10-CM

## 2022-12-02 DIAGNOSIS — R5381 Other malaise: Secondary | ICD-10-CM

## 2022-12-02 DIAGNOSIS — I70223 Atherosclerosis of native arteries of extremities with rest pain, bilateral legs: Secondary | ICD-10-CM | POA: Diagnosis not present

## 2022-12-02 LAB — CULTURE, BLOOD (ROUTINE X 2)
Culture: NO GROWTH
Culture: NO GROWTH
Special Requests: ADEQUATE
Special Requests: ADEQUATE

## 2022-12-02 LAB — BASIC METABOLIC PANEL
Anion gap: 9 (ref 5–15)
BUN: 44 mg/dL — ABNORMAL HIGH (ref 8–23)
CO2: 27 mmol/L (ref 22–32)
Calcium: 9.1 mg/dL (ref 8.9–10.3)
Chloride: 104 mmol/L (ref 98–111)
Creatinine, Ser: 1.24 mg/dL — ABNORMAL HIGH (ref 0.44–1.00)
GFR, Estimated: 43 mL/min — ABNORMAL LOW (ref >60)
Glucose, Bld: 321 mg/dL — ABNORMAL HIGH (ref 70–99)
Potassium: 3.9 mmol/L (ref 3.5–5.1)
Sodium: 140 mmol/L (ref 135–145)

## 2022-12-02 LAB — GLUCOSE, CAPILLARY
Glucose-Capillary: 290 mg/dL — ABNORMAL HIGH (ref 70–99)
Glucose-Capillary: 383 mg/dL — ABNORMAL HIGH (ref 70–99)
Glucose-Capillary: 450 mg/dL — ABNORMAL HIGH (ref 70–99)
Glucose-Capillary: 298 mg/dL — ABNORMAL HIGH (ref 70–99)
Glucose-Capillary: 280 mg/dL — ABNORMAL HIGH (ref 70–99)

## 2022-12-02 MED ORDER — INSULIN GLARGINE-YFGN 100 UNIT/ML ~~LOC~~ SOLN
10.0000 [IU] | Freq: Every day | SUBCUTANEOUS | Status: DC
  Administered 2022-12-02: 10 [IU] via SUBCUTANEOUS
  Filled 2022-12-02 (×2): qty 0.1

## 2022-12-02 MED ORDER — INSULIN ASPART 100 UNIT/ML IJ SOLN
0.0000 [IU] | Freq: Three times a day (TID) | INTRAMUSCULAR | Status: DC
  Administered 2022-12-02: 8 [IU] via SUBCUTANEOUS
  Administered 2022-12-02: 15 [IU] via SUBCUTANEOUS
  Administered 2022-12-02 – 2022-12-03 (×2): 8 [IU] via SUBCUTANEOUS
  Administered 2022-12-03: 5 [IU] via SUBCUTANEOUS
  Administered 2022-12-03 – 2022-12-04 (×2): 8 [IU] via SUBCUTANEOUS

## 2022-12-02 NOTE — Progress Notes (Signed)
Daily Progress Note Intern Pager: [phone]  Patient name: Wendy [name] Medical record number: [id number] Date of birth: 1941-11-08 Age: 82 y.o. Gender: female  Primary Care Provider: Default, Provider, MD Consultants: Vascular surgery, Palliative, Podiatry (s/o) Code Status: Full  Pt Overview and Major Events to Date:  11/21/22: Admitted, vascular consulted 11/22/22: L leg femoral to peroneal artery bypass, podiatry consulted 11/26/22: Per conversation with Palliative, family decided to proceed with left AKA 11/28/22: Left AKA  Assessment and Plan: Wendy [name] is a 82 y.o. female that was admitted for leg pain and found to have worsening chronic critical limb ischemia that failed graft treatment and is now s/p Left AKA. PMH significant for HTN, CKD, T2DM, Sjrogen's syndrome, Anemia, GERD, HLD.    * Critical limb ischemia of both lower extremities (HCC) POD 4 from L AKA. Did require a dose of Dilaudid overnight.  - Vascular following, appreciate recs - DVT ppx with subq heparin - Daily ASA - Bowel regimen: Miralax BID, senna BID, dulcolax  - Pain Management  Tylenol '1000mg'$  q6h PRN  Gabapentin '200mg'$  TID  Tramadol q12h PRN moderate pain  QHS 2.'5mg'$  QHS PRN severe pain (per family request)  Dilaudid 0.'5mg'$  q3h PRN for breakthrough pain  Essential hypertension Blood pressures ranging 120s-160s/60s-80s in the last 24h.  - Metoprolol '25mg'$  daily - Amlodipine '5mg'$  daily - Clonidine 0.'2mg'$  weekly - Irbesartan '75mg'$  daily - Daily BMP  Type 2 diabetes mellitus with kidney complication, without long-term current use of insulin (HCC) CBGs continue to remain elevated in the 300s. Needing more tightly controlled sugars to improve wound healing so increased Semglee overnight. - CBGs QAC and QHS - Increase SSI to moderate - Increased Semglee to 8 Units  Acute blood loss anemia Hgb trend is stable.  - Continue SubQ heparin for DVT ppx - Daily CBC    FEN/GI: Dysphagia 1  PPx:  SubQ Heparin Dispo:SNF pending bed placement  Subjective:  Patient reports her pain is improving. Daughter reports that the right leg pain is intermittent and this morning she got Tramadol with improvement. Daughter is concerned about patient's buttocks, I discussed with nursing as I was unable to rotate patient on my own and she notes that the skin overnight was intact and they placed a pillow to help remove some pressure off of the buttocks but she was declining further movement changes overnight.   Objective: Temp:  [98 F (36.7 C)-99.4 F (37.4 C)] 99.4 F (37.4 C) (01/07 0456) Pulse Rate:  [80-95] 90 (01/07 0456) Resp:  [16-20] 20 (01/07 0456) BP: (126-164)/(68-81) 145/74 (01/07 0456) SpO2:  [98 %-100 %] 99 % (01/07 0456) Physical Exam: General: Frail elderly female, NAD, supine in bed, daughter at bedside Cardiovascular: RRR Respiratory: breathing comfortably on room air Extremities: Left AKA that is well-healing and without drainage, right leg TTP with light touch, no peripheral edema.   Laboratory: Most recent CBC Lab Results  Component Value Date   WBC 15.8 (H) 12/01/2022   HGB 9.0 (L) 12/01/2022   HCT 28.2 (L) 12/01/2022   MCV 76.6 (L) 12/01/2022   PLT 615 (H) 12/01/2022   Most recent BMP    Latest Ref Rng & Units 12/02/2022    1:01 AM  BMP  Glucose 70 - 99 mg/dL 321   BUN 8 - 23 mg/dL 44   Creatinine 0.44 - 1.00 mg/dL 1.24   Sodium 135 - 145 mmol/L 140   Potassium 3.5 - 5.1 mmol/L 3.9   Chloride 98 -  111 mmol/L 104   CO2 22 - 32 mmol/L 27   Calcium 8.9 - 10.3 mg/dL 9.1      Wendy Kolinski, DO 12/02/2022, 6:42 AM  PGY-3, [place] Intern pager: [phone], text pages welcome Secure chat group [email or web address]

## 2022-12-02 NOTE — Progress Notes (Addendum)
Progress Note    12/02/2022 6:56 AM 4 Days Post-Op  Subjective:  sitting up in bed this morning eating breakfast.  Daughter at bedside.  She tells me that her sister felt a pureed diet would better suit their mom due to how long it takes her to eat.  She does not have any swallowing issues just chews her food for a long time before swallowing.  Daughter wants diet changed back to regular soft diet so that the pt will eat her food.    Tm 99.4  Vitals:   12/02/22 0033 12/02/22 0456  BP: (!) 164/81 (!) 145/74  Pulse: 95 90  Resp: 18 20  Temp: 99 F (37.2 C) 99.4 F (37.4 C)  SpO2: 100% 99%    Physical Exam: General:  no distress Cardiac:  regular Lungs:  non labored Incisions:  left AKA not examined as pt is eating Extremities:  right foot unchanged from yesterday with peroneal doppler signal   CBC    Component Value Date/Time   WBC 15.8 (H) 12/01/2022 0555   RBC 3.68 (L) 12/01/2022 0555   HGB 9.0 (L) 12/01/2022 0555   HGB 9.0 (L) 05/03/2020 1005   HCT 28.2 (L) 12/01/2022 0555   HCT 28.9 (L) 05/03/2020 1005   PLT 615 (H) 12/01/2022 0555   PLT 476 (H) 05/03/2020 1005   MCV 76.6 (L) 12/01/2022 0555   MCV 78 (L) 05/03/2020 1005   MCH 24.5 (L) 12/01/2022 0555   MCHC 31.9 12/01/2022 0555   RDW 16.6 (H) 12/01/2022 0555   RDW 15.5 (H) 05/03/2020 1005   LYMPHSABS 1.4 11/20/2022 1638   LYMPHSABS 1.8 05/03/2020 1005   MONOABS 1.2 (H) 11/20/2022 1638   EOSABS 0.0 11/20/2022 1638   EOSABS 0.1 05/03/2020 1005   BASOSABS 0.0 11/20/2022 1638   BASOSABS 0.1 05/03/2020 1005    BMET    Component Value Date/Time   NA 140 12/02/2022 0101   NA 140 07/26/2020 1506   K 3.9 12/02/2022 0101   CL 104 12/02/2022 0101   CO2 27 12/02/2022 0101   GLUCOSE 321 (H) 12/02/2022 0101   BUN 44 (H) 12/02/2022 0101   BUN 37 (H) 07/26/2020 1506   CREATININE 1.24 (H) 12/02/2022 0101   CALCIUM 9.1 12/02/2022 0101   CALCIUM 10.1 06/07/2022 0938   GFRNONAA 43 (L) 12/02/2022 0101   GFRAA 32  (L) 08/02/2020 1410    INR    Component Value Date/Time   INR 1.2 11/22/2022 0628     Intake/Output Summary (Last 24 hours) at 12/02/2022 0656 Last data filed at 12/01/2022 1342 Gross per 24 hour  Intake --  Output 550 ml  Net -550 ml      Assessment/Plan:  82 y.o. female is s/p:  Left femoral to peroneal artery bypass graft with 6 mm external ring PTFE  10 Days Post-Op And  Thrombectomy  Left femoral to peroneal artery bypass 9 Days Post-Op And  Left above knee amputation  4 Days Post-Op   -pt right foot unchanged with peroneal doppler signal -I did not inspect her left AKA as she was eating breakfast and this takes priority to get nutrition to help with healing.  -given that daughter states pt just chews her food for long periods of time without difficulty swallowing, diet to changed to regular so that she will eat as she needs nutrition to heal.  -pt needs to be out of bed to chair.  Pt states she would like to take a nap after breakfast  and then get up.  Discussed with daughter and pt that it is important to try to mobilize to help prevent sacral decubitus and even when doing everything perfectly, she could still develop one.  Daughter states her father passed away a few years ago with cancer and was at [facility] and developed a sacral wound.  -DVT prophylaxis:  sq heparin   Leontine Locket, PA-C [facility] [phone] 12/02/2022 6:56 AM   VASCULAR STAFF ADDENDUM: I have independently interviewed and examined the patient. I agree with the above.   Family was present and asked me about the right lower extremity, and plans for revascularization. I had an honest conversation with them regarding Baylynn's overall clinical status. Even prior to her admission, Dodi had failure to thrive.  She has struggled with p.o. intake for years.  Her excellent home support, has kept her alive up to this point. Prior to discussion of revascularization versus primary  amputation, I asked her to share with me her goals.  Unfortunately her goals were unrealistic-she will never walk again.  Most family members are realistic and understand that she does not have much more time left in her current clinical condition.  Her youngest son, who had ROSC status post 7 rounds of CPR several years ago, is much more optimistic, and unrealistic in expectations.  Tityana has had palliative discussion cares in the past, but has had difficulty making decisions. Her mental status appears to be significantly improved from yesterday, therefore palliative care discussions in the coming days may be beneficial.  No plans for intervention on the right lower extremity at this time. In her current state, she would be best served with above-knee amputation should the need arise.  I plan to discuss the above with Dr. Trula Slade later today.   Cassandria Santee, MD [facility] Phone Number: [phone] 12/02/2022 9:35 AM

## 2022-12-03 DIAGNOSIS — I70223 Atherosclerosis of native arteries of extremities with rest pain, bilateral legs: Secondary | ICD-10-CM | POA: Diagnosis not present

## 2022-12-03 DIAGNOSIS — Z515 Encounter for palliative care: Secondary | ICD-10-CM | POA: Diagnosis not present

## 2022-12-03 DIAGNOSIS — Z89612 Acquired absence of left leg above knee: Secondary | ICD-10-CM | POA: Diagnosis not present

## 2022-12-03 DIAGNOSIS — R5381 Other malaise: Secondary | ICD-10-CM | POA: Diagnosis not present

## 2022-12-03 LAB — GLUCOSE, CAPILLARY
Glucose-Capillary: 283 mg/dL — ABNORMAL HIGH (ref 70–99)
Glucose-Capillary: 257 mg/dL — ABNORMAL HIGH (ref 70–99)
Glucose-Capillary: 233 mg/dL — ABNORMAL HIGH (ref 70–99)
Glucose-Capillary: 222 mg/dL — ABNORMAL HIGH (ref 70–99)

## 2022-12-03 LAB — CBC
HCT: 27.5 % — ABNORMAL LOW (ref 36.0–46.0)
Hemoglobin: 8.1 g/dL — ABNORMAL LOW (ref 12.0–15.0)
MCH: 23.7 pg — ABNORMAL LOW (ref 26.0–34.0)
MCHC: 29.5 g/dL — ABNORMAL LOW (ref 30.0–36.0)
MCV: 80.4 fL (ref 80.0–100.0)
Platelets: 652 10*3/uL — ABNORMAL HIGH (ref 150–400)
RBC: 3.42 MIL/uL — ABNORMAL LOW (ref 3.87–5.11)
RDW: 16.7 % — ABNORMAL HIGH (ref 11.5–15.5)
WBC: 18.1 10*3/uL — ABNORMAL HIGH (ref 4.0–10.5)
nRBC: 0 % (ref 0.0–0.2)

## 2022-12-03 LAB — BASIC METABOLIC PANEL
Anion gap: 7 (ref 5–15)
BUN: 42 mg/dL — ABNORMAL HIGH (ref 8–23)
CO2: 28 mmol/L (ref 22–32)
Calcium: 8.7 mg/dL — ABNORMAL LOW (ref 8.9–10.3)
Chloride: 101 mmol/L (ref 98–111)
Creatinine, Ser: 1.23 mg/dL — ABNORMAL HIGH (ref 0.44–1.00)
GFR, Estimated: 44 mL/min — ABNORMAL LOW (ref >60)
Glucose, Bld: 426 mg/dL — ABNORMAL HIGH (ref 70–99)
Potassium: 5 mmol/L (ref 3.5–5.1)
Sodium: 136 mmol/L (ref 135–145)

## 2022-12-03 MED ORDER — INSULIN GLARGINE-YFGN 100 UNIT/ML ~~LOC~~ SOLN
20.0000 [IU] | Freq: Every day | SUBCUTANEOUS | Status: DC
  Administered 2022-12-03: 20 [IU] via SUBCUTANEOUS
  Filled 2022-12-03 (×2): qty 0.2

## 2022-12-03 MED ORDER — PROSOURCE PLUS PO LIQD
30.0000 mL | Freq: Two times a day (BID) | ORAL | Status: DC
  Administered 2022-12-04 (×2): 30 mL via ORAL
  Filled 2022-12-03 (×2): qty 30

## 2022-12-03 NOTE — Inpatient Diabetes Management (Signed)
Inpatient Diabetes Program Recommendations  AACE/ADA: New Consensus Statement on Inpatient Glycemic Control (2015)  Target Ranges:  Prepandial:   less than 140 mg/dL      Peak postprandial:   less than 180 mg/dL (1-2 hours)      Critically ill patients:  140 - 180 mg/dL   Lab Results  Component Value Date   GLUCAP 283 (H) 12/03/2022   HGBA1C 11.2 (H) 11/21/2022    Review of Glycemic Control  Latest Reference Range & Units 12/02/22 06:38 12/02/22 13:47 12/02/22 13:55 12/02/22 17:48 12/02/22 21:05 12/03/22 05:56  Glucose-Capillary 70 - 99 mg/dL 290 (H) 450 (H) 383 (H) 298 (H) 280 (H) 283 (H)  (H): Data is abnormally high  Diabetes history: DM2 Outpatient Diabetes medications: Amaryl 4 mg QD, Metformin 500 mg BID Current orders for Inpatient glycemic control: Semglee 20 units QD, Novolog 0-15 units TID and 0-5 units QHS  Inpatient Diabetes Program Recommendations:    Noted, Semglee increased today from 10 units daily to 20 units daily.  Please also consider,  Novolog 3 units TID with meals if consumes at least 50%.  Will continue to follow while inpatient.  Thank you, Reche Dixon, MSN, [place] Diabetes Coordinator Inpatient Diabetes Program [phone] (team pager from 8a-5p)

## 2022-12-03 NOTE — Evaluation (Addendum)
Occupational Therapy Evaluation Patient Details Name: Wendy [name] MRN: [id number] DOB: October 10, 1941 Today's Date: 12/03/2022   History of Present Illness Pt is an 82 y/o female admitted on 11/20/22 after presenting with c/o increased BLE pain, worsening wounds L foot. S/p L common femoral to peroneal artery bypass on 12/28. S/p L AKA 1/3. PMH includes DM2, essential HTN, PVD, CKD 3, RA, IBS, memory impairment.   Clinical Impression   Wendy [name] was evaluated s/p the above admission list, per her daughter pt is typically able to ambulate short distances with AD in her home, sponge bathes, and has assist form family for IADLs. Upon evaluation pt was limited by new LLE AKA, impaired cognition, pain, generalized weakness, poor activity tolerance, and limited sitting balance. Overall she required up to mod A for bed mobility, and min G - mod A for sitting balance. Standing deferred this date. While sitting EOB pt was able to reach her RLE but continued to need max A for LB ADLs, and total A for toileting at bed level. Pt's family present and educated on Ot's role in this setting, there were understanding and appreciative. OT to continue to follow acutely. Recommend d/c to SNF for continued therapy.   Supine BP 156/83 (102) Sitting BP 114/93 (101) with report of dizziness      Recommendations for follow up therapy are one component of a multi-disciplinary discharge planning process, led by the attending physician.  Recommendations may be updated based on patient status, additional functional criteria and insurance authorization.   Follow Up Recommendations  Skilled nursing-short term rehab (<3 hours/day)     Assistance Recommended at Discharge Frequent or constant Supervision/Assistance  Patient can return home with the following A lot of help with walking and/or transfers;A lot of help with bathing/dressing/bathroom;Assistance with cooking/housework;Direct supervision/assist for medications  management;Direct supervision/assist for financial management;Assist for transportation;Help with stairs or ramp for entrance    Functional Status Assessment  Patient has had a recent decline in their functional status and demonstrates the ability to make significant improvements in function in a reasonable and predictable amount of time.  Equipment Recommendations  None recommended by OT       Precautions / Restrictions Precautions Precautions: Fall;Other (comment) Precaution Comments: s/p L AKA 1/3 Restrictions Weight Bearing Restrictions: Yes LLE Weight Bearing: Non weight bearing      Mobility Bed Mobility Overal bed mobility: Needs Assistance Bed Mobility: Supine to Sit, Sit to Supine Rolling: Mod assist   Supine to sit: Max assist Sit to supine: Mod assist   General bed mobility comments: cues and increased time needed    Transfers Overall transfer level: Needs assistance                 General transfer comment: deferred      Balance Overall balance assessment: Needs assistance Sitting-balance support: Single extremity supported, Feet supported Sitting balance-Leahy Scale: Poor Sitting balance - Comments: min G- mod A for sitting balance           ADL either performed or assessed with clinical judgement   ADL Overall ADL's : Needs assistance/impaired Eating/Feeding: Set up;Sitting   Grooming: Set up;Sitting   Upper Body Bathing: Moderate assistance;Sitting   Lower Body Bathing: Maximal assistance;Bed level   Upper Body Dressing : Minimal assistance;Sitting   Lower Body Dressing: Maximal assistance;Bed level   Toilet Transfer: Total assistance   Toileting- Clothing Manipulation and Hygiene: Total assistance       Functional mobility during ADLs: Maximal assistance (bed level)  General ADL Comments: limited by impiared cog, weakness, poor sitting balance and activity tolerance. L AKA     Vision Baseline Vision/History: 1 Wears  glasses Vision Assessment?: No apparent visual deficits     Perception Perception Perception Tested?: No   Praxis Praxis Praxis tested?: Not tested    Pertinent Vitals/Pain Pain Assessment Pain Assessment: Faces Faces Pain Scale: Hurts a little bit Pain Location: L residual limb with bed mobility Pain Descriptors / Indicators: Discomfort, Grimacing, Guarding Pain Intervention(s): Limited activity within patient's tolerance, Monitored during session     Hand Dominance     Extremity/Trunk Assessment Upper Extremity Assessment Upper Extremity Assessment: Generalized weakness   Lower Extremity Assessment Lower Extremity Assessment: Generalized weakness   Cervical / Trunk Assessment Cervical / Trunk Assessment: Kyphotic   Communication Communication Communication: No difficulties   Cognition Arousal/Alertness: Awake/alert Behavior During Therapy: Flat affect Overall Cognitive Status: History of cognitive impairments - at baseline               General Comments: impaired memory at baseline. Pt oriented to self and situation. Able to recall family memebers names, but no where they live. Followed simple 1 step functional commands. asking appropriate questions about therapy schedule     General Comments  Pt's daughter and son in law present at the start of the session, supportive. BP dropped when sitting EOB            Home Living Family/patient expects to be discharged to:: Private residence Living Arrangements: Alone Available Help at Discharge: Family                             Additional Comments: Pt & son did not provide entirity of home set up information. Pt lives in house with stairs to negotiate at some point (either inside or outside). Son lives in trailer behind pt but has been staying with pt to assist. Pt reports son picks her up around trunk to assist PRN.      Prior Functioning/Environment Prior Level of Function : Needs assist              Mobility Comments: per daugher: mobile for short distances with AD, spends a lot of time sitting in a chair ADLs Comments: per daughter: sponge bathes, ambulates to toilet but also wears briefs        OT Problem List: Decreased strength;Decreased range of motion;Decreased activity tolerance;Impaired balance (sitting and/or standing);Decreased knowledge of use of DME or AE;Decreased safety awareness;Decreased knowledge of precautions;Pain      OT Treatment/Interventions: Self-care/ADL training;Therapeutic exercise;Therapeutic activities;Patient/family education;Balance training    OT Goals(Current goals can be found in the care plan section) Acute Rehab OT Goals Patient Stated Goal: to get stronger OT Goal Formulation: With patient Time For Goal Achievement: 12/17/22 Potential to Achieve Goals: Fair ADL Goals Pt Will Perform Grooming: with set-up Pt Will Perform Upper Body Dressing: with set-up;sitting Pt Will Perform Lower Body Dressing: with mod assist;sitting/lateral leans Pt Will Transfer to Toilet: with mod assist;stand pivot transfer;bedside commode Additional ADL Goal #1: pt will complete bed mobility with min G as a precursor to ADLs  OT Frequency: Min 2X/week       AM-PAC OT "6 Clicks" Daily Activity     Outcome Measure Help from another person eating meals?: A Little Help from another person taking care of personal grooming?: A Little Help from another person toileting, which includes using toliet, bedpan, or urinal?: Total Help  from another person bathing (including washing, rinsing, drying)?: A Lot Help from another person to put on and taking off regular upper body clothing?: A Little Help from another person to put on and taking off regular lower body clothing?: A Lot 6 Click Score: 14   End of Session Nurse Communication: Mobility status  Activity Tolerance: Patient tolerated treatment well Patient left: in bed;with call bell/phone within reach;with bed  alarm set;with family/visitor present  OT Visit Diagnosis: Unsteadiness on feet (R26.81);Other abnormalities of gait and mobility (R26.89);Muscle weakness (generalized) (M62.81)                Time: 1051-1110 OT Time Calculation (min): 19 min Charges:  OT General Charges $OT Visit: 1 Visit OT Evaluation $OT Eval Moderate Complexity: 1 Mod    Kandie Keiper D Causey 12/03/2022, 12:38 PM

## 2022-12-03 NOTE — Progress Notes (Signed)
Nutrition Follow-up  DOCUMENTATION CODES:   Not applicable  INTERVENTION:   Continue Ensure Enlive po BID, each supplement provides 350 kcal and 20 grams of protein. Continue Magic cup TID with meals, each supplement provides 290 kcal and 9 grams of protein Continue Renal Multivitamin w/ minerals daily 30 ml ProSource Plus BID, each supplement provides 100 kcals and 15 grams protein.  Discontinue Juven Chopped meats  NUTRITION DIAGNOSIS:   Inadequate oral intake related to inability to eat, lethargy/confusion as evidenced by per patient/family report, meal completion < 25%. - Ongoing  GOAL:   Patient will meet greater than or equal to 90% of their needs - Ongoing  MONITOR:   PO intake, Supplement acceptance, Labs, I & O's, Skin  REASON FOR ASSESSMENT:   Consult Assessment of nutrition requirement/status  ASSESSMENT:   82 y.o. female admits related to bilateral leg pain. PMH includes: HTN, CKD, T2DM, anemia, GERD, HLD. Pt is currently receiving medical management for critical limb ischemia of both lower extremities.  12/26 - Admitted 01/03 - OR, L AKA; SLP recommended Dysphagia 1 01/07 - PA liberalized pt diet to regular  Pt laying in bed, daughter at bedside provides information to RD. Daughter reports that pt is able to take pureed foods well, but is pocketing solid foods. Dtr expressed that it appears the pt is getting fatigued after eating, acknowledged that it is a large possibility. Shares that they are ordering majority pureed foods or soups. RD to attempted chopped meats, unable to provide pureed meats only. Dtr shares that pt has been eating some of the Magic cups, drinking some Juven (often not finishing), and has not seen any Ensure's in a few days. RD discussed trying ProSource that is only 30 mL and provides 15 gm protein, daughter willing to try.  RD also encouraged family to bring in food from home that pt enjoys and can eat to help with PO intake.   Meal  Intakes 1/02-1/08: 0-75% x 8 meals (average 17.5%)  Medications reviewed and include: Folic acid, NovoLog SSI, Semglee, Rena-vit, Protonix, Miralax, Senokot-S Labs reviewed: 24 hr CBGs 257-450  Diet Order:   Diet Order             Diet regular Room service appropriate? Yes; Fluid consistency: Thin  Diet effective now                   EDUCATION NEEDS:   Not appropriate for education at this time  Skin:  Skin Assessment: Skin Integrity Issues: Skin Integrity Issues:: Stage II Stage II: L and R buttocks  Last BM:  1/7  Height:  Ht Readings from Last 1 Encounters:  11/28/22 '5\' 4"'$  (1.626 m)   Weight:  Wt Readings from Last 1 Encounters:  12/03/22 55.1 kg   Ideal Body Weight:  54.6 kg  BMI:  Body mass index is 20.85 kg/m.  Estimated Nutritional Needs:  Kcal:  1700-1900 Protein:  85-100 grams Fluid:  >/= 1.7 L   Hermina Barters RD, LDN Clinical Dietitian See [facility] for contact information.

## 2022-12-03 NOTE — Progress Notes (Signed)
Palliative Medicine Progress Note   Patient Name: Wendy [name]       Date: 12/03/2022 DOB: [birth year]  Age: 82 y.o. MRN#: [id number] Attending Physician: Lenoria Chime, MD Primary Care Physician: Default, Provider, MD Admit Date: 11/20/2022    HPI/Patient Profile: Wendy [name] is a 82 y.o. female that presented with bilateral leg pain found to have worsening chronic critical limb ischemia s/p stent with vascular surgery. PMH includes HTN, CKD, T2DM, Sjogren syndrome, anemia, GERD, HLD      Palliative Medicine was consulted for goals of care conversations in the setting of decisions related to L AKA.   Patient underwent left AKA on 11/28/22.   Subjective: Notified by Dr. Sabra Heck [facility] Medicine) that family is requesting to meet with PMT again to discuss goals of care.   Chart reviewed. Note that patient is medically stable for discharge.   I met with daughters Lorriane Shire (in the room) and [name] (on the phone). They have questions related to disposition and options. Continued conversation was had regarding the natural trajectory after major amputation. Reviewed disposition options including SNF/rehab, home health with PT, and home hospice.   Discussed that PT/OT is recommending SNF/rehab. Reviewed that the goal of rehab is to improve functional status, which can be challenging in the setting of advanced illness and/or cognitive impairment.  Reviewed that the philosophy of hospice is to provide support while focusing on comfort and quality of life rather than pursuit of life-prolonging interventions. Discussed that hospice could provide personal care for patient as well as assistance with symptom management when she further declines.   Daughters express concern regarding the possibility that  patient will eventually develop critical limb ischemia of the right leg. Discussed that surgical intervention (Right AKA) would not be consistent with hospice philosophy, but that patient could be kept comfortable with medication.   All questions and concerns have been addressed.    Objective:  Physical Exam Vitals reviewed.  Constitutional:      General: She is sleeping. She is not in acute distress.    Appearance: She is ill-appearing.     Comments: frail  Pulmonary:     Effort: Pulmonary effort is normal.  Musculoskeletal:     Left Lower Extremity: Left leg is amputated above knee.  Vital Signs: BP (!) 149/72   Pulse 82   Temp 98.4 F (36.9 C)   Resp 18   Ht '5\' 4"'$  (1.626 m)   Wt 55.1 kg   SpO2 99%   BMI 20.85 kg/m  SpO2: SpO2: 99 % O2 Device: O2 Device: Room Air    Palliative Medicine Assessment & Plan   Assessment: Principal Problem:   Critical limb ischemia of both lower extremities (HCC) Active Problems:   Type 2 diabetes mellitus with kidney complication, without long-term current use of insulin (HCC)   Essential hypertension   Acute blood loss anemia   Pressure injury of skin   S/P AKA (above knee amputation), left (HCC)   Acute lower extremity ischemia   Cachexia (HCC)   Physical deconditioning    Recommendations/Plan: Continue current full scope interventions Family is considering disposition options  Ongoing palliative support   Code Status: Full code   Prognosis:  6-12 months  Discharge Planning: To Be Determined    Thank you for allowing the Palliative Medicine Team to assist in the care of this patient.   MDM - High   Lavena Bullion, NP   Please contact Palliative Medicine Team phone at [phone] for questions and concerns.  For individual providers, please see [facility].

## 2022-12-03 NOTE — Progress Notes (Addendum)
[facility] [place]  Subjective  - comfortable   Objective (!) 158/84 88 99 F (37.2 C) (Oral) 20 100%  Intake/Output Summary (Last 24 hours) at 12/03/2022 0734 Last data filed at 12/03/2022 0313 Gross per 24 hour  Intake --  Output 2500 ml  Net -2500 ml    Left AKA healing well Right foot with dry changes to toes, motor intact, rest pain on/off, peroneal signal intact Lungs non labored breathing General no acute distress   Assessment/Planning: 82 y.o. female is s/p:  Left femoral to peroneal artery bypass graft with 6 mm external ring PTFE  11 Days Post-Op And  Thrombectomy  Left femoral to peroneal artery bypass 10 Days Post-Op And  Left above knee amputation  5 Days Post-Op  Right foot with signal vessel peroneal signal, dry wounds on toes unchanged Encouraged mobility -DVT prophylaxis:  sq heparin  Encouraged PO intake Pending discharge to possible SNF [facility] will be maintained for 4 weeks    Roxy Horseman 12/03/2022 7:34 AM --  Laboratory Lab Results: Recent Labs    12/01/22 0555 12/03/22 0103  WBC 15.8* 18.1*  HGB 9.0* 8.1*  HCT 28.2* 27.5*  PLT 615* 652*   BMET Recent Labs    12/02/22 0101 12/03/22 0103  NA 140 136  K 3.9 5.0  CL 104 101  CO2 27 28  GLUCOSE 321* 426*  BUN 44* 42*  CREATININE 1.24* 1.23*  CALCIUM 9.1 8.7*    COAG Lab Results  Component Value Date   INR 1.2 11/22/2022   No results found for: "PTT"  With both sisters at the bedside today.  They are on board with our treatment plan but feel that their brother is not and is not participating in family meetings.  She is occasionally having some phantom pain in her left leg.  This morning she is not complaining of any right leg pain.  I think it would be beneficial to get palliative care involved as we will likely have to make a decision regarding what to do with her right leg in the near future.  Options would be bypass surgery, with likely  similar result to the left leg, versus proceeding directly to a primary above-knee amputation.  [name]

## 2022-12-03 NOTE — TOC Progression Note (Signed)
Transition of Care [facility]) - Progression Note    Patient Details  Name: Wendy [name] MRN: [id number] Date of Birth: Nov 06, 1941  Transition of Care [facility]) CM/SW [place], [place] Phone Number: 12/03/2022, 2:05 PM  Clinical Narrative:     CSW called pt's daughter Wendy [name] and provided that pt has one additional offer at [facility] which has 5/5 medicare star rating. Wendy [name] is interested in this facility though wants to talk with her sister first.   Wendy [name] calls CSW back shortly and states she spoke with sister and they confirm choice of [facility]. CSW explains insurance auth process and potential DC tmrw. Will need new therapy note for insurance auth; OT notified and will put in note as they already met with pt this morning  CSW contacted [facility] and confirmed pt could admit tomorrow pending insurance auth.   1300: CSW initiated insurance SNF auth request in [place] hub. Ref# [id number]. Auth Status: pending  Expected Discharge Plan: [place] Barriers to Discharge: Insurance Authorization  Expected Discharge Plan and Services In-house Referral: Clinical Social Work     Living arrangements for the past 2 months: Single Family Home                                       Social Determinants of Health (SDOH) Interventions [place]: No Food Insecurity (11/22/2022)  Housing: Low Risk  (11/22/2022)  Transportation Needs: No Transportation Needs (11/22/2022)  Utilities: Not At Risk (11/22/2022)  Depression (PHQ2-9): Medium Risk (12/08/2020)  Financial Resource Strain: Low Risk  (10/05/2020)  Physical Activity: Inactive (10/05/2020)  Social Connections: Socially Isolated (12/08/2020)  Tobacco Use: Low Risk  (11/29/2022)    Readmission Risk Interventions     No data to display

## 2022-12-03 NOTE — Progress Notes (Addendum)
Daily Progress Note Intern Pager: [phone]  Patient name: Wendy [name] Medical record number: [id number] Date of birth: 07-Sep-1941 Age: 82 y.o. Gender: female  Primary Care Provider: Default, Provider, MD Consultants: Vascular surgery, palliative, podiatry (s/o) Code Status: Full code  Pt Overview and Major Events to Date:  11/21/22: Admitted, vascular consulted 11/22/22: L leg femoral to peroneal artery bypass, podiatry consulted 11/26/22: Per conversation with Palliative, family decided to proceed with left AKA 11/28/22: Left AKA  Assessment and Plan:  Wendy [name] is a 82 y.o. female that was admitted for leg pain and found to have worsening chronic critical limb ischemia that failed graft treatment and is now s/p Left AKA. PMH significant for HTN, CKD, T2DM, Sjrogen's syndrome, Anemia, GERD, HLD.    She is medically stable for discharge.    * Critical limb ischemia of both lower extremities (HCC) POD 5 from L AKA. Received tramadol overnight. Pain appears to be better.   - Vascular following, appreciate recs - family requesting new palliative meeting  - DVT ppx with subq heparin - Daily ASA - Bowel regimen: Miralax BID, senna BID, dulcolax  - Pain Management  Tylenol '1000mg'$  q6h PRN  Gabapentin '200mg'$  TID  Tramadol q12h PRN moderate pain  QHS 2.'5mg'$  QHS PRN severe pain (per family request)  DC dilaudid   Essential hypertension Blood pressures ranging 120s-160s/60s-80s in the last 24h.  - Metoprolol '25mg'$  daily - Amlodipine '5mg'$  daily - Clonidine 0.'2mg'$  weekly - Irbesartan '75mg'$  daily - Daily BMP  Type 2 diabetes mellitus with kidney complication, without long-term current use of insulin (HCC) CBGs continue to remain elevated in the 280s. Needing more tightly controlled sugars to improve wound healing. Received 34 units aspart for correction.  - increase Semglee to 20 units daily (1/8) - CBGs QAC and QHS - Increase SSI to moderate  Acute blood loss anemia Hgb  trend is stable. Dropped to 8.1 (1/8) platelets stable  - Continue SubQ heparin for DVT ppx - Daily CBC   FEN/GI: Dysphagia 1 PPx: Subcu heparin Dispo:SNF  pending bed placement .   Subjective:  Alert, eating breakfast.  Daughter at bedside requesting meeting with palliative care to determine goals of care and possibility for home health.  She is not satisfied with current state for approved SNF's.  Patient's pain appears to be well-controlled, and daughter confirms.  She has not required IV Dilaudid in 24 hours.  Objective: Temp:  [98.3 F (36.8 C)-99.5 F (37.5 C)] 99.5 F (37.5 C) (01/08 0827) Pulse Rate:  [80-88] 86 (01/08 0827) Resp:  [17-20] 20 (01/08 0827) BP: (139-164)/(75-84) 156/83 (01/08 0827) SpO2:  [99 %-100 %] 100 % (01/08 0827) Weight:  [55.1 kg] 55.1 kg (01/08 0312) Physical Exam: Chronically ill-appearing, no acute distress Cardio: Regular rate, regular rhythm, no murmurs on exam. Pulm: Clear, no wheezing, no crackles. No increased work of breathing Abdominal: bowel sounds present, soft, non-tender, non-distended Extremities: no peripheral edema, staples intact on left with no drainage or signs of infection  Neuro: alert and oriented x3, speech normal in content, no facial asymmetry  Laboratory: Most recent CBC Lab Results  Component Value Date   WBC 18.1 (H) 12/03/2022   HGB 8.1 (L) 12/03/2022   HCT 27.5 (L) 12/03/2022   MCV 80.4 12/03/2022   PLT 652 (H) 12/03/2022   Most recent BMP    Latest Ref Rng & Units 12/03/2022    1:03 AM  BMP  Glucose 70 - 99 mg/dL 426  BUN 8 - 23 mg/dL 42   Creatinine 0.44 - 1.00 mg/dL 1.23   Sodium 135 - 145 mmol/L 136   Potassium 3.5 - 5.1 mmol/L 5.0   Chloride 98 - 111 mmol/L 101   CO2 22 - 32 mmol/L 28   Calcium 8.9 - 10.3 mg/dL 8.7    Darci Current, DO 12/03/2022, 11:27 AM  PGY-1, [place] Intern pager: [phone], text pages welcome Secure chat group [place]

## 2022-12-04 ENCOUNTER — Other Ambulatory Visit: Payer: Self-pay | Admitting: Student

## 2022-12-04 LAB — GLUCOSE, CAPILLARY
Glucose-Capillary: 90 mg/dL (ref 70–99)
Glucose-Capillary: 284 mg/dL — ABNORMAL HIGH (ref 70–99)

## 2022-12-04 LAB — CBC
HCT: 30.6 % — ABNORMAL LOW (ref 36.0–46.0)
Hemoglobin: 9.4 g/dL — ABNORMAL LOW (ref 12.0–15.0)
MCH: 24.5 pg — ABNORMAL LOW (ref 26.0–34.0)
MCHC: 30.7 g/dL (ref 30.0–36.0)
MCV: 79.7 fL — ABNORMAL LOW (ref 80.0–100.0)
Platelets: 658 10*3/uL — ABNORMAL HIGH (ref 150–400)
RBC: 3.84 MIL/uL — ABNORMAL LOW (ref 3.87–5.11)
RDW: 16.7 % — ABNORMAL HIGH (ref 11.5–15.5)
WBC: 17.6 10*3/uL — ABNORMAL HIGH (ref 4.0–10.5)
nRBC: 0 % (ref 0.0–0.2)

## 2022-12-04 MED ORDER — TRAMADOL HCL 50 MG PO TABS
50.0000 mg | ORAL_TABLET | Freq: Four times a day (QID) | ORAL | Status: DC | PRN
  Administered 2022-12-04 (×2): 50 mg via ORAL
  Filled 2022-12-04 (×2): qty 1

## 2022-12-04 MED ORDER — INSULIN GLARGINE 100 UNIT/ML ~~LOC~~ SOLN: 20.0000 [IU] | Freq: Every day | SUBCUTANEOUS | 11 refills | Status: DC

## 2022-12-04 MED ORDER — OXYCODONE HCL 5 MG PO TABS: 2.5000 mg | ORAL_TABLET | ORAL | 0 refills | Status: DC | PRN

## 2022-12-04 MED ORDER — ACETAMINOPHEN 500 MG PO TABS
1000.0000 mg | ORAL_TABLET | Freq: Four times a day (QID) | ORAL | Status: DC
  Administered 2022-12-04 (×2): 1000 mg via ORAL
  Filled 2022-12-04 (×2): qty 2

## 2022-12-04 MED ORDER — METOPROLOL SUCCINATE ER 25 MG PO TB24: 25.0000 mg | ORAL_TABLET | Freq: Every day | ORAL | Status: DC

## 2022-12-04 MED ORDER — ROSUVASTATIN CALCIUM 10 MG PO TABS: 10.0000 mg | ORAL_TABLET | Freq: Every day | ORAL | Status: DC

## 2022-12-04 MED ORDER — TRAMADOL HCL 50 MG PO TABS: 50.0000 mg | ORAL_TABLET | Freq: Four times a day (QID) | ORAL | 0 refills | Status: DC | PRN

## 2022-12-04 MED ORDER — RENA-VITE PO TABS: 1.0000 | ORAL_TABLET | Freq: Every day | ORAL | 0 refills | Status: DC

## 2022-12-04 MED ORDER — INSULIN GLARGINE-YFGN 100 UNIT/ML ~~LOC~~ SOLN: 20.0000 [IU] | Freq: Every day | SUBCUTANEOUS | 11 refills | Status: DC

## 2022-12-04 MED ORDER — POLYETHYLENE GLYCOL 3350 17 G PO PACK: 17.0000 g | PACK | Freq: Two times a day (BID) | ORAL | 0 refills | Status: DC

## 2022-12-04 MED ORDER — ACETAMINOPHEN 500 MG PO TABS: 1000.0000 mg | ORAL_TABLET | Freq: Four times a day (QID) | ORAL | 0 refills | Status: DC

## 2022-12-04 MED ORDER — MEMANTINE HCL 10 MG PO TABS: 10.0000 mg | ORAL_TABLET | Freq: Every day | ORAL | Status: DC

## 2022-12-04 MED ORDER — HYDROMORPHONE HCL 1 MG/ML IJ SOLN
0.5000 mg | INTRAMUSCULAR | Status: DC | PRN
  Filled 2022-12-04: qty 0.5

## 2022-12-04 MED ORDER — PROSOURCE PLUS PO LIQD: 30.0000 mL | Freq: Two times a day (BID) | ORAL | Status: DC

## 2022-12-04 MED ORDER — SENNOSIDES-DOCUSATE SODIUM 8.6-50 MG PO TABS: 2.0000 | ORAL_TABLET | Freq: Two times a day (BID) | ORAL | Status: DC

## 2022-12-04 MED ORDER — GABAPENTIN 400 MG PO CAPS
400.0000 mg | ORAL_CAPSULE | Freq: Three times a day (TID) | ORAL | Status: DC
  Administered 2022-12-04: 400 mg via ORAL
  Filled 2022-12-04: qty 1

## 2022-12-04 MED ORDER — AMLODIPINE BESYLATE 5 MG PO TABS: 5.0000 mg | ORAL_TABLET | Freq: Every day | ORAL | 0 refills | Status: DC

## 2022-12-04 MED ORDER — ENSURE ENLIVE PO LIQD: 237.0000 mL | Freq: Three times a day (TID) | ORAL | 12 refills | Status: DC

## 2022-12-04 MED ORDER — TRAMADOL HCL 50 MG PO TABS: 50.0000 mg | ORAL_TABLET | Freq: Four times a day (QID) | ORAL | Status: DC

## 2022-12-04 MED ORDER — OXYCODONE HCL 5 MG PO TABS: 2.5000 mg | ORAL_TABLET | ORAL | Status: DC | PRN

## 2022-12-04 MED ORDER — GABAPENTIN 100 MG PO CAPS: 200.0000 mg | ORAL_CAPSULE | Freq: Three times a day (TID) | ORAL | Status: DC

## 2022-12-04 NOTE — Progress Notes (Signed)
Daily Progress Note Intern Pager: [phone]  Patient name: Wendy [name] Medical record number: [id number] Date of birth: December 21, 1940 Age: 82 y.o. Gender: female  Primary Care Provider: Default, Provider, MD Consultants: Vascular surgery, palliative, podiatry (S/O) Code Status: Full code  Pt Overview and Major Events to Date:  11/21/22: Admitted, vascular consulted 11/22/22: L leg femoral to peroneal artery bypass, podiatry consulted 11/26/22: Per conversation with Palliative, family decided to proceed with left AKA 11/28/22: Left AKA  Assessment and Plan:  Wendy [name] is a 82 y.o. female that was admitted for leg pain and found to have worsening chronic critical limb ischemia that failed graft treatment and is now s/p Left AKA. PMH significant for HTN, CKD, T2DM, Sjrogen's syndrome, Anemia, GERD, HLD.     She is medically stable for discharge and has SNF bed approved.  Disposition will depend on family's decision to pursue right AKA prior to discharge.   * Critical limb ischemia of both lower extremities (HCC) POD 6 from L AKA. Received tramadol overnight. Pain in the right leg increased this morning.  Clarified with vascular surgery and Lorriane Shire that plan is to discharge to SNF today with pain control through p.o. medications to give family more time to discuss if they would like further surgical intervention on the right. - Vascular following, appreciate recs - DVT ppx with subq heparin - Daily ASA - Bowel regimen: Miralax BID, senna BID, dulcolax  - Pain Management  Tylenol '1000mg'$  q6h  Gabapentin '200mg'$  TID  Tramadol 50 mg q12h PRN moderate pain  QHS 2.'5mg'$  QHS PRN severe pain   Essential hypertension Blood pressures ranging 120s-160s/60s-80s in the last 24h.  - Metoprolol '25mg'$  daily - Amlodipine '5mg'$  daily - Clonidine 0.'2mg'$  weekly - Irbesartan '75mg'$  daily - Daily BMP  Type 2 diabetes mellitus with kidney complication, without long-term current use of insulin  (HCC) Morning fasting CBG 90. This is the first true day of receiving LA insulin. No changes made today.   - increase Semglee to 20 units daily (1/8) first dose received at night  - CBGs QAC and QHS - Increase SSI to moderate  Acute blood loss anemia Hgb trend is stable. - Continue SubQ heparin for DVT ppx - Daily CBC   FEN/GI: Dysphagia 1 PPx: Subcutaneous heparin Dispo:SNF today. Barriers include family's decision on pursuing right AKA.   Subjective:  Patient began having increased pain in her right leg this morning.  Objective: Temp:  [98 F (36.7 C)-98.6 F (37 C)] 98.4 F (36.9 C) (01/09 0818) Pulse Rate:  [77-92] 92 (01/09 0833) Resp:  [16-18] 16 (01/09 0818) BP: (134-169)/(71-74) 134/71 (01/09 0833) SpO2:  [99 %-100 %] 100 % (01/09 0818) Physical Exam: Chronically ill-appearing, no acute distress Cardio: Regular rate, regular rhythm, no murmurs on exam. Pulm: Clear, no wheezing, no crackles. No increased work of breathing Abdominal: bowel sounds present, soft, non-tender, non-distended Extremities: no peripheral edema  Neuro: alert and oriented x3, speech normal in content, no facial asymmetry  Laboratory: Most recent CBC Lab Results  Component Value Date   WBC 17.6 (H) 12/04/2022   HGB 9.4 (L) 12/04/2022   HCT 30.6 (L) 12/04/2022   MCV 79.7 (L) 12/04/2022   PLT 658 (H) 12/04/2022   Most recent BMP    Latest Ref Rng & Units 12/03/2022    1:03 AM  BMP  Glucose 70 - 99 mg/dL 426   BUN 8 - 23 mg/dL 42   Creatinine 0.44 - 1.00 mg/dL 1.23  Sodium 135 - 145 mmol/L 136   Potassium 3.5 - 5.1 mmol/L 5.0   Chloride 98 - 111 mmol/L 101   CO2 22 - 32 mmol/L 28   Calcium 8.9 - 10.3 mg/dL 8.7    Darci Current, DO 12/04/2022, 9:19 AM  PGY-1, [place] Intern pager: [phone], text pages welcome Secure chat group [place]

## 2022-12-04 NOTE — Discharge Summary (Signed)
[facility] Discharge Summary  Patient name: Wendy [name] Medical record number: [id number] Date of birth: 08-09-1941 Age: 82 y.o. Gender: female Date of Admission: 11/20/2022  Date of Discharge: 12/04/22  Admitting Physician: Wells Guiles, DO  Primary Care Provider: Default, Provider, MD Consultants: Vascular Surgery   Indication for Hospitalization: Limb Ischemia   Discharge Diagnoses/Problem List:  Principal Problem for Admission: Critical Limb Ischemia  Other Problems addressed during stay:  Principal Problem:   Critical limb ischemia of both lower extremities ([place]) Active Problems:   Essential hypertension   Type 2 diabetes mellitus with kidney complication, without long-term current use of insulin (HCC)   Acute blood loss anemia   Pressure injury of skin   S/P AKA (above knee amputation), left (HCC)   Acute lower extremity ischemia   Cachexia [facility])   Physical deconditioning    Brief Hospital Course:  Wendy [name] is a 82 y.o.female with a history of chronic critical limb ischemia s/p stent with vascular surgery. PMH includes HTN, CKD, T2DM, Sjogren syndrome, anemia, GERD, HLD who was admitted to the [facility] Teaching [facility] at [facility] for bilateral leg pain. Her hospital course is detailed below:  Critical limb ischemia, bilateral Presents with severe bilateral leg pain associated with black eschar and ulceration of toes on L foot. H/o severe critical LE ischemia requiring revascularization that patient opted to delayed until 11/2022 to spend holidays at home. Vascular surgery consulted and patient had left leg femoral to peroneal artery bypass on 12/28. She had early graft occlusion and returned to the OR on 12/29 for thrombectomy which again was occluded. Vascular was not able to revascularize and presented patient with the option for palliative above knee amputation on the left and likely on the right. Palliative was consulted and  there were ongoing discussions about goals of care which ultimate resulted in the L AKA on 1/3. Her pain was well controled with scheduled tylenol and Toradol and she was discharged to SNF on 1/9 with follow up scheduled with vascular surgery outpatient.    Pain Management Protocol:  Basal: Tylenol 1,000 mg every 6 hours scheduled  Moderate Pain: Tramadol 50 mg every 6 hours alternated with tylenol  Severe Breakthrough Pain: 2.5 mg oxycodone every 4 hours as needed  Other chronic conditions were medically managed with home medications and formulary alternatives as necessary (CKD, dementia)  PCP Follow-up Recommendations:  PVD operations: pain currently controlled with medications. If pain persists can consider palliative surgical intervention.  Diabetes: Given age and risk for falls given PVD and extremity neurologic changes, glimepiride and pioglitazone were discontinued and 20 units long acting insulin was started.   Disposition: SNF  Discharge Condition: Stable   Discharge Exam:  Vitals:   12/04/22 0833 12/04/22 1138  BP: 134/71 (!) 153/79  Pulse: 92 85  Resp:  20  Temp:  98.6 F (37 C)  SpO2:  100%   Chronically ill-appearing, no acute distress Cardio: Regular rate, regular rhythm, no murmurs on exam. Pulm: Clear, no wheezing, no crackles. No increased work of breathing Abdominal: bowel sounds present, soft, non-tender, non-distended Extremities: no peripheral edema  Neuro: alert and oriented x3, speech normal in content, no facial asymmetry  Exam preformed by Darci Current, DO  Significant Procedures:  Left AKA preformed 11/28/2022  Significant Labs and Imaging:  Recent Labs  Lab 12/03/22 0103 12/04/22 0149  WBC 18.1* 17.6*  HGB 8.1* 9.4*  HCT 27.5* 30.6*  PLT 652* 658*   Recent Labs  Lab 12/03/22 0103  NA 136  K 5.0  CL 101  CO2 28  GLUCOSE 426*  BUN 42*  CREATININE 1.23*  CALCIUM 8.7*   CXR:  IMPRESSION: Left basilar atelectasis and  effusion  Results/Tests Pending at Time of Discharge: none   Discharge Medications:  Allergies as of 12/04/2022       Reactions   Codeine    Erythromycin    Leflunomide Other (See Comments)   Lisinopril    Naproxen    Penicillins    Filled Augmentin 09/2022   Pioglitazone    Valsartan Other (See Comments)        Medication List     STOP taking these medications    cilostazol 100 MG tablet Commonly known as: PLETAL   cloNIDine 0.2 mg/24hr patch Commonly known as: CATAPRES - Dosed in mg/24 hr   doxycycline 100 MG tablet Commonly known as: VIBRA-TABS   Gemtesa 75 MG Tabs Generic drug: Vibegron   glimepiride 4 MG tablet Commonly known as: AMARYL   hydrochlorothiazide 12.5 MG tablet Commonly known as: HYDRODIURIL   meclizine 25 MG tablet Commonly known as: ANTIVERT   ondansetron 4 MG tablet Commonly known as: ZOFRAN   pantoprazole 40 MG tablet Commonly known as: PROTONIX   pioglitazone 15 MG tablet Commonly known as: ACTOS       TAKE these medications    acetaminophen 500 MG tablet Commonly known as: TYLENOL Take 2 tablets (1,000 mg total) by mouth every 6 (six) hours.   amLODipine 5 MG tablet Commonly known as: NORVASC Take 1 tablet (5 mg total) by mouth daily. Start taking on: December 05, 2022   aspirin EC 81 MG tablet Take 81 mg by mouth daily.   cloNIDine 0.3 mg/24hr patch Commonly known as: CATAPRES - Dosed in mg/24 hr 0.3 mg once a week.   cycloSPORINE 0.05 % ophthalmic emulsion Commonly known as: RESTASIS Place 1 drop into both eyes 2 (two) times daily.   feeding supplement Liqd Take 237 mLs by mouth 3 (three) times daily between meals.   (feeding supplement) PROSource Plus liquid Take 30 mLs by mouth 2 (two) times daily between meals.   folic acid 1 MG tablet Commonly known as: FOLVITE Take 1 tablet (1 mg total) by mouth daily.   furosemide 20 MG tablet Commonly known as: LASIX Take 20 mg by mouth every evening.    gabapentin 100 MG capsule Commonly known as: NEURONTIN Take 2 capsules (200 mg total) by mouth 3 (three) times daily.   insulin glargine-yfgn 100 UNIT/ML injection Commonly known as: SEMGLEE Inject 0.2 mLs (20 Units total) into the skin at bedtime.   irbesartan 75 MG tablet Commonly known as: AVAPRO Take 75 mg by mouth at bedtime.   memantine 10 MG tablet Commonly known as: NAMENDA Take 1 tablet (10 mg total) by mouth daily. Start taking on: December 05, 2022 What changed: when to take this   metFORMIN 1000 MG tablet Commonly known as: GLUCOPHAGE Take 0.5 tablets (500 mg total) by mouth 2 (two) times daily with a meal. Renal dose   metoprolol succinate 25 MG 24 hr tablet Commonly known as: TOPROL-XL Take 1 tablet (25 mg total) by mouth daily. Start taking on: December 05, 2022   mirtazapine 15 MG tablet Commonly known as: REMERON Take 7.5 mg by mouth at bedtime.   multivitamin Tabs tablet Take 1 tablet by mouth at bedtime.   oxyCODONE 5 MG immediate release tablet Commonly known as: Oxy IR/ROXICODONE Take 0.5 tablets (  2.5 mg total) by mouth every 4 (four) hours as needed for breakthrough pain or severe pain.   polyethylene glycol 17 g packet Commonly known as: MIRALAX / GLYCOLAX Take 17 g by mouth 2 (two) times daily.   rivastigmine 9.5 mg/24hr Commonly known as: EXELON APPLY 1 PATCH TOPICALLY ONCE DAILY What changed:  how much to take how to take this when to take this   rosuvastatin 10 MG tablet Commonly known as: CRESTOR Take 1 tablet (10 mg total) by mouth daily. Start taking on: December 05, 2022   senna-docusate 8.6-50 MG tablet Commonly known as: Senokot-S Take 2 tablets by mouth 2 (two) times daily.   traMADol 50 MG tablet Commonly known as: ULTRAM Take 1 tablet (50 mg total) by mouth every 6 (six) hours as needed for moderate pain.        Discharge Instructions: Please refer to Patient Instructions section of EMR for full details.  Patient  was counseled important signs and symptoms that should prompt return to medical care, changes in medications, dietary instructions, activity restrictions, and follow up appointments.   Follow-Up Appointments:  Follow-up Information     Serafina Mitchell, MD Follow up in 4 week(s).   Specialties: Vascular Surgery, Cardiology Why: Office will call you to arrange your appt (sent) Contact information: [place] [address] [place], [place], DO 12/04/2022, 1:20 PM PGY-1, [place]

## 2022-12-04 NOTE — Progress Notes (Signed)
Physical Therapy Treatment Patient Details Name: Wendy [name] MRN: [id number] DOB: 1941/03/24 Today's Date: 12/04/2022   History of Present Illness Pt is an 82 y/o female admitted on 11/20/22 after presenting with c/o increased BLE pain, worsening wounds L foot. S/p L common femoral to peroneal artery bypass on 12/28. S/p L AKA 1/3. PMH includes DM2, essential HTN, PVD, CKD 3, RA, IBS, memory impairment.    PT Comments    Pt progressing slowly towards her goals; pt drowsy, likely due to recent medication usage. Pt daughter present and supportive throughout. Focus on therapeutic exercises for strengthening, core activation, and sitting balance. Pt requiring maximal assist for bed mobility. Deferred transfer out of bed today due to level of arousal. Continue to recommend SNF for ongoing Physical Therapy.      Recommendations for follow up therapy are one component of a multi-disciplinary discharge planning process, led by the attending physician.  Recommendations may be updated based on patient status, additional functional criteria and insurance authorization.  Follow Up Recommendations  Skilled nursing-short term rehab (<3 hours/day) Can patient physically be transported by private vehicle: No   Assistance Recommended at Discharge Frequent or constant Supervision/Assistance  Patient can return home with the following Two people to help with walking and/or transfers;A lot of help with bathing/dressing/bathroom;Assistance with cooking/housework;Direct supervision/assist for medications management;Direct supervision/assist for financial management;Assist for transportation;Help with stairs or ramp for entrance   Equipment Recommendations  BSC/3in1;Wheelchair (measurements PT);Wheelchair cushion (measurements PT);Hospital bed;Other (comment) (hoyer lift)    Recommendations for Other Services       Precautions / Restrictions Precautions Precautions: Fall;Other (comment) Precaution Comments:  s/p L AKA 1/3 Restrictions Weight Bearing Restrictions: Yes LLE Weight Bearing: Non weight bearing     Mobility  Bed Mobility Overal bed mobility: Needs Assistance Bed Mobility: Supine to Sit, Sit to Supine     Supine to sit: Max assist, +2 for safety/equipment Sit to supine: Max assist, +2 for safety/equipment   General bed mobility comments: cues and increased time needed    Transfers Overall transfer level: Needs assistance                 General transfer comment: deferred    Ambulation/Gait                   Stairs             Wheelchair Mobility    Modified Rankin (Stroke Patients Only)       Balance Overall balance assessment: Needs assistance Sitting-balance support: Single extremity supported, Feet supported Sitting balance-Leahy Scale: Poor Sitting balance - Comments: min G- mod A for sitting balance                                    Cognition Arousal/Alertness: Awake/alert Behavior During Therapy: Flat affect Overall Cognitive Status: History of cognitive impairments - at baseline                                 General Comments: impaired memory at baseline.        Exercises Amputee Exercises Gluteal Sets: Both, 5 reps, Supine Hip ABduction/ADduction: AAROM, Both, Supine, 5 reps Straight Leg Raises: AAROM, Both, Supine, 5 reps Other Exercises Other Exercises: Sitting: cervical rotation, flex/ext, trunk rotation to R/L, anterior/posterior weight shifting Other Exercises: Supine: RLE single leg bridge x 5  General Comments        Pertinent Vitals/Pain Pain Assessment Pain Assessment: Faces Faces Pain Scale: Hurts little more Pain Location: RLE Pain Descriptors / Indicators: Discomfort, Grimacing, Guarding Pain Intervention(s): Limited activity within patient's tolerance, Monitored during session, Premedicated before session    Home Living                          Prior  Function            PT Goals (current goals can now be found in the care plan section) Acute Rehab PT Goals Potential to Achieve Goals: Fair    Frequency    Min 2X/week      PT Plan Current plan remains appropriate    Co-evaluation              AM-PAC PT "6 Clicks" Mobility   Outcome Measure  Help needed turning from your back to your side while in a flat bed without using bedrails?: A Lot Help needed moving from lying on your back to sitting on the side of a flat bed without using bedrails?: A Lot Help needed moving to and from a bed to a chair (including a wheelchair)?: Total Help needed standing up from a chair using your arms (e.g., wheelchair or bedside chair)?: Total Help needed to walk in hospital room?: Total Help needed climbing 3-5 steps with a railing? : Total 6 Click Score: 8    End of Session   Activity Tolerance: Patient limited by lethargy Patient left: in bed;with call bell/phone within reach;with family/visitor present;with bed alarm set Nurse Communication: Mobility status PT Visit Diagnosis: Pain;Difficulty in walking, not elsewhere classified (R26.2);Muscle weakness (generalized) (M62.81) Pain - Right/Left: Left Pain - part of body: Leg;Ankle and joints of foot     Time: 1030-1057 PT Time Calculation (min) (ACUTE ONLY): 27 min  Charges:  $Therapeutic Exercise: 8-22 mins $Therapeutic Activity: 8-22 mins                     Wyona Almas, PT, DPT Acute Rehabilitation Services Office [phone]    Deno Etienne 12/04/2022, 2:10 PM

## 2022-12-04 NOTE — Care Management Important Message (Signed)
Important Message  Patient Details  Name: CANTRELL MARTUS MRN: [id number] Date of Birth: 1941-08-05   Medicare Important Message Given:  Yes     Shelda Altes 12/04/2022, 11:37 AM

## 2022-12-04 NOTE — Progress Notes (Signed)
Patient report given to [facility] RN of [facility] facility,all questions were answered.

## 2022-12-04 NOTE — TOC Transition Note (Signed)
Transition of Care [facility]) - CM/SW Discharge Note   Patient Details  Name: Wendy [name] MRN: [id number] Date of Birth: 1941/10/31  Transition of Care [facility]) CM/SW Contact:  Bethann Berkshire, [place] Phone Number: 12/04/2022, 2:35 PM   Clinical Narrative:     Auth approved 12/04/22 -- 12/06/22 [id number]  Patient will DC to: [facility] Anticipated DC date: 12/04/22 Family notified: Daughter Wendy [name] Transport by: Corey Harold   Per MD patient ready for DC to [facility]. RN, patient, patient's family, and facility notified of DC. Discharge Summary and FL2 sent to facility. RN to call report prior to discharge [phone]  Room313A). DC packet on chart. Ambulance transport requested for patient.   CSW will sign off for now as social work intervention is no longer needed. Please consult [place] again if new needs arise.   Final next level of care: [place] Barriers to Discharge: No Barriers Identified   Patient Goals and CMS Choice      Discharge Placement                Patient chooses bed at: [facility] Patient to be transferred to facility by: [facility] Name of family member notified: Daughter Wendy [name] Patient and family notified of of transfer: 12/04/22  Discharge Plan and Services Additional resources added to the After Visit Summary for   In-house Referral: Clinical Social Work                                   Social Determinants of Health ([place]) Interventions [place]: No Food Insecurity (11/22/2022)  Housing: Low Risk  (11/22/2022)  Transportation Needs: No Transportation Needs (11/22/2022)  Utilities: Not At Risk (11/22/2022)  Depression (PHQ2-9): Medium Risk (12/08/2020)  Financial Resource Strain: Low Risk  (10/05/2020)  Physical Activity: Inactive (10/05/2020)  Social Connections: Socially Isolated (12/08/2020)  Tobacco Use: Low Risk  (11/29/2022)     Readmission Risk Interventions     No data to display

## 2022-12-04 NOTE — Progress Notes (Signed)
[facility]   Subjective  - Significant pain right LE   Objective (!) 150/73 78 98.6 F (37 C) (Oral) 17 99%  Intake/Output Summary (Last 24 hours) at 12/04/2022 0741 Last data filed at 12/04/2022 0357 Gross per 24 hour  Intake 150 ml  Output 1150 ml  Net -1000 ml   Limb warm to ankle, no peroneal signal this am Left AKA stump is healing well Foot and toes cool to touch, crying in pain Lungs non labored breathing   Assessment/Planning: 82 y.o. female is s/p:  Left femoral to peroneal artery bypass graft with 6 mm external ring PTFE  12 Days Post-Op And  Thrombectomy  Left femoral to peroneal artery bypass 11 Days Post-Op And  Left above knee amputation  6 Days Post-Op  Increased pain this am in the right LE Re started dilaudid for pain control per patient request Options per DR. Brabham's note would be bypass surgery, with likely similar result to the left leg, versus proceeding directly to a primary above-knee amputation.  Pending families decision.   Wendy [name] 12/04/2022 7:41 AM --  Laboratory Lab Results: Recent Labs    12/03/22 0103 12/04/22 0149  WBC 18.1* 17.6*  HGB 8.1* 9.4*  HCT 27.5* 30.6*  PLT 652* 658*   BMET Recent Labs    12/02/22 0101 12/03/22 0103  NA 140 136  K 3.9 5.0  CL 104 101  CO2 27 28  GLUCOSE 321* 426*  BUN 44* 42*  CREATININE 1.24* 1.23*  CALCIUM 9.1 8.7*    COAG Lab Results  Component Value Date   INR 1.2 11/22/2022   No results found for: "PTT"

## 2022-12-11 ENCOUNTER — Encounter (HOSPITAL_COMMUNITY): Payer: Self-pay | Admitting: Vascular Surgery

## 2023-07-28 DEATH — deceased

## 2024-11-25 ENCOUNTER — Telehealth: Payer: Self-pay | Admitting: Podiatry

## 2024-11-25 NOTE — Telephone Encounter (Signed)
$  50 no show fee for Dr. Magdalen appears from 09/13/2022 is now in collections. Daughter Shanda is just now recieveing a collection bill and will like to dispute the charges. Please call her @ [phone] at your earliest convince. Note: patient passed on 26-Jul-2023.
